# Patient Record
Sex: Female | Born: 1965 | Race: White | Hispanic: No | Marital: Married | Smoking: Never smoker
Health system: Southern US, Community
[De-identification: ages and names within clinical notes are randomized; demographics above are authoritative.]

## PROBLEM LIST (undated history)

## (undated) DIAGNOSIS — I10 Essential (primary) hypertension: Secondary | ICD-10-CM

## (undated) DIAGNOSIS — F419 Anxiety disorder, unspecified: Secondary | ICD-10-CM

## (undated) DIAGNOSIS — R51 Headache: Secondary | ICD-10-CM

## (undated) DIAGNOSIS — E039 Hypothyroidism, unspecified: Secondary | ICD-10-CM

## (undated) DIAGNOSIS — K859 Acute pancreatitis without necrosis or infection, unspecified: Secondary | ICD-10-CM

## (undated) DIAGNOSIS — E785 Hyperlipidemia, unspecified: Secondary | ICD-10-CM

## (undated) DIAGNOSIS — I6330 Cerebral infarction due to thrombosis of unspecified cerebral artery: Secondary | ICD-10-CM

## (undated) DIAGNOSIS — G4700 Insomnia, unspecified: Secondary | ICD-10-CM

## (undated) DIAGNOSIS — N189 Chronic kidney disease, unspecified: Secondary | ICD-10-CM

## (undated) DIAGNOSIS — R519 Headache, unspecified: Secondary | ICD-10-CM

## (undated) DIAGNOSIS — E119 Type 2 diabetes mellitus without complications: Secondary | ICD-10-CM

## (undated) DIAGNOSIS — I639 Cerebral infarction, unspecified: Secondary | ICD-10-CM

## (undated) DIAGNOSIS — Z87442 Personal history of urinary calculi: Secondary | ICD-10-CM

## (undated) DIAGNOSIS — G4730 Sleep apnea, unspecified: Secondary | ICD-10-CM

## (undated) DIAGNOSIS — M797 Fibromyalgia: Secondary | ICD-10-CM

## (undated) HISTORY — PX: SHOULDER SURGERY: SHX246

## (undated) HISTORY — PX: ABDOMINAL HYSTERECTOMY: SHX81

## (undated) HISTORY — PX: ROTATOR CUFF REPAIR: SHX139

## (undated) HISTORY — PX: CHOLECYSTECTOMY: SHX55

---

## 2014-11-30 DIAGNOSIS — Z8673 Personal history of transient ischemic attack (TIA), and cerebral infarction without residual deficits: Secondary | ICD-10-CM | POA: Insufficient documentation

## 2014-11-30 DIAGNOSIS — E781 Pure hyperglyceridemia: Secondary | ICD-10-CM | POA: Insufficient documentation

## 2014-12-05 ENCOUNTER — Ambulatory Visit (INDEPENDENT_AMBULATORY_CARE_PROVIDER_SITE_OTHER): Payer: Self-pay | Admitting: Endocrinology

## 2014-12-05 ENCOUNTER — Encounter: Payer: Self-pay | Admitting: Endocrinology

## 2014-12-05 VITALS — BP 132/78 | HR 93 | Temp 98.1°F | Wt 238.2 lb

## 2014-12-05 DIAGNOSIS — E118 Type 2 diabetes mellitus with unspecified complications: Secondary | ICD-10-CM

## 2014-12-05 LAB — POCT GLYCOSYLATED HEMOGLOBIN (HGB A1C): Hemoglobin A1C: 11.3

## 2014-12-05 MED ORDER — INSULIN ASPART PROT & ASPART (70-30 MIX) 100 UNIT/ML PEN: PEN_INJECTOR | SUBCUTANEOUS | Status: DC

## 2014-12-05 NOTE — Patient Instructions (Addendum)
good diet and exercise significantly improve the control of your diabetes.  please let me know if you wish to be referred to a dietician.  high blood sugar is very risky to your health.  you should see an eye doctor and dentist every year.  It is very important to get all recommended vaccinations.  controlling your blood pressure and cholesterol drastically reduces the damage diabetes does to your body.  Those who smoke should quit.  please discuss these with your doctor.  check your blood sugar twice a day.  vary the time of day when you check, between before the 3 meals, and at bedtime.  also check if you have symptoms of your blood sugar being too high or too low.  please keep a record of the readings and bring it to your next appointment here.  You can write it on any piece of paper.  please call us sooner if your blood sugar goes below 70, or if you have a lot of readings over 200. please try stopping the metformin, to see if this helps your nausea.  For now, please change both insulins to "novolog 70/30," 100 units with breakfast, and 50 units with the evening meal.   Please sigh a release of information for your past test results and hospitalization.   Please consider having weight loss surgery.  It is good for your health.  If you decide to consider further, please call [phone], and register for a free informational meeting.  Please call us next week, to tell us how the blood sugar is doing. Please come back for a follow-up appointment in 1 month.

## 2014-12-05 NOTE — Progress Notes (Signed)
Subjective:    Patient ID: [name], female    DOB: 1965/07/07, 49 y.o.   MRN: [id number]  HPI pt states DM was dx'ed in 2009; she has severe neuropathy of the lower extremities, but she is unaware of any associated chronic complications; he has been on insulin since dx; pt says her diet and exercise are good; she has never had GDM, pancreatitis, severe hypoglycemia or DKA.  She was dx'ed with chronic idiopathic pancreatitis in 2014.  She takes levemir 50 units bid, and novolog 14 units 3 times a day (just before each meal).  She says cbg's vary from 150-550.  There is no trend throughout the day.  She denies hypoglycemia.  Pt says she misses approx 1 insulin dose per day (usually at lunch). No past medical history on file.  No past surgical history on file.  Social History   Social History  . Marital Status: Married    Spouse Name: N/A  . Number of Children: N/A  . Years of Education: N/A   Occupational History  . Not on file.   Social History Main Topics  . Smoking status: Not on file  . Smokeless tobacco: Not on file  . Alcohol Use: Not on file  . Drug Use: Not on file  . Sexual Activity: Not on file   Other Topics Concern  . Not on file   Social History Narrative  . No narrative on file    No current outpatient prescriptions on file prior to visit.   No current facility-administered medications on file prior to visit.    Not on File  Family History  Problem Relation Age of Onset  . Diabetes Neg Hx     BP 132/78 mmHg  Pulse 93  Temp(Src) 98.1 F (36.7 C) (Oral)  Wt 238 lb 4 oz (108.069 kg)  SpO2 97%   Review of Systems denies weight loss, blurry vision, chest pain, sob, muscle cramps, excessive diaphoresis, cold intolerance, rhinorrhea, and easy bruising.  She has chronic nausea, fatigue, headache, urinary frequency, depression, and diffuse myalgias.      Objective:   Physical Exam VS: see vs page GEN: no distress.  Morbid obesity HEAD: head: no  deformity eyes: no periorbital swelling, no proptosis external nose and ears are normal mouth: no lesion seen NECK: supple, thyroid is not enlarged CHEST WALL: no deformity LUNGS:  Clear to auscultation CV: reg rate and rhythm, no murmur ABD: abdomen is soft, nontender.  no hepatosplenomegaly.  not distended.  Large self-reducing ventral hernia MUSCULOSKELETAL: muscle bulk and strength are grossly normal.  no obvious joint swelling.  gait is normal and steady EXTEMITIES: no deformity.  no ulcer on the feet.  feet are of normal color and temp.  1+ bilat leg edema PULSES: dorsalis pedis intact bilat.  no carotid bruit NEURO:  cn 2-12 grossly intact.   readily moves all 4's.  sensation is intact to touch on the feet, but severely decreased from normal. SKIN:  Normal texture and temperature.  No rash or suspicious lesion is visible.   NODES:  None palpable at the neck PSYCH: alert, well-oriented.  Does not appear anxious nor depressed.   A1c=11.4%    Assessment & Plan:  DM: severe exacerbation: we discussed insulin regimen options: as she sometimes misses lunch insulin, she chooses bid insulin Nausea: possibly due to metformin.   Patient is advised the following: Patient Instructions  good diet and exercise significantly improve the control of your diabetes.  please let  me know if you wish to be referred to a dietician.  high blood sugar is very risky to your health.  you should see an eye doctor and dentist every year.  It is very important to get all recommended vaccinations.  controlling your blood pressure and cholesterol drastically reduces the damage diabetes does to your body.  Those who smoke should quit.  please discuss these with your doctor.  check your blood sugar twice a day.  vary the time of day when you check, between before the 3 meals, and at bedtime.  also check if you have symptoms of your blood sugar being too high or too low.  please keep a record of the readings and  bring it to your next appointment here.  You can write it on any piece of paper.  please call us sooner if your blood sugar goes below 70, or if you have a lot of readings over 200. please try stopping the metformin, to see if this helps your nausea.  For now, please change both insulins to "novolog 70/30," 100 units with breakfast, and 50 units with the evening meal.   Please sigh a release of information for your past test results and hospitalization.   Please consider having weight loss surgery.  It is good for your health.  If you decide to consider further, please call [phone], and register for a free informational meeting.  Please call us next week, to tell us how the blood sugar is doing. Please come back for a follow-up appointment in 1 month.

## 2014-12-05 NOTE — Progress Notes (Signed)
Pre visit review using our clinic review tool, if applicable. No additional management support is needed unless otherwise documented below in the visit note. 

## 2014-12-17 ENCOUNTER — Telehealth: Payer: Self-pay | Admitting: Endocrinology

## 2014-12-17 MED ORDER — FLUCONAZOLE 150 MG PO TABS: 150.0000 mg | ORAL_TABLET | Freq: Once | ORAL | Status: DC

## 2014-12-17 NOTE — Telephone Encounter (Signed)
Please see below.

## 2014-12-17 NOTE — Telephone Encounter (Signed)
Since the increase in dosage of the insulin she started to develop a yeast infection can we call in something for her yeast infection

## 2014-12-17 NOTE — Telephone Encounter (Signed)
i have sent a prescription to your pharmacy. Skip simvastatin for 2 days after taking. Call PCP is sxs do not get better

## 2014-12-17 NOTE — Telephone Encounter (Signed)
Please call into [facility]

## 2014-12-17 NOTE — Telephone Encounter (Signed)
Forwarded message

## 2014-12-18 NOTE — Telephone Encounter (Signed)
Left voicemail advising of note below. Requested call back if the pt would like to discuss.  

## 2014-12-19 ENCOUNTER — Telehealth: Payer: Self-pay | Admitting: Endocrinology

## 2014-12-19 NOTE — Telephone Encounter (Signed)
See below

## 2014-12-19 NOTE — Telephone Encounter (Signed)
Pt advised of note below and voiced understanding.  

## 2014-12-19 NOTE — Telephone Encounter (Signed)
Pt has stated she has had blood sugars above 500 since she was last seen even with the insulin we put her on.

## 2014-12-19 NOTE — Telephone Encounter (Signed)
See note below and please advise, Thanks! 

## 2014-12-19 NOTE — Telephone Encounter (Signed)
Please verify no n/v Then increase insulin to 120 units with breakfast, and 70 units with the evening meal. Call 2 days to report progress

## 2014-12-21 ENCOUNTER — Telehealth: Payer: Self-pay

## 2014-12-21 NOTE — Telephone Encounter (Signed)
Left voicemail advising of note below requested call back if the pt would like to discuss.

## 2014-12-21 NOTE — Telephone Encounter (Signed)
Please increase the insulin to 140 units with breakfast, and 90 units with the evening meal. Please call in 4 days to report progress

## 2014-12-21 NOTE — Telephone Encounter (Signed)
Pt called to report blood sugar readings.   12/20/2014 Fasting: 459 Before Bed: 439  12/21/2014: Fasting: 399  Pt confirmed she is taking 120 units of novolog with breakfast and 70 units with her evening meal. Please advise, Thanks!

## 2014-12-25 ENCOUNTER — Telehealth: Payer: Self-pay | Admitting: Endocrinology

## 2014-12-25 MED ORDER — INSULIN ASPART PROT & ASPART (70-30 MIX) 100 UNIT/ML PEN: PEN_INJECTOR | SUBCUTANEOUS | Status: DC

## 2014-12-25 NOTE — Telephone Encounter (Signed)
Patient called to report blood sugar readings. 9/3: Fasting: 315 Before Bed: 389  9/4: Fasting: 259 Before Bed: 410  9/5: Pt stated she could not remember exactly these numbers but they were over 300  9/6: Fasting: 379  Patient is taking 140 units of novolog at breakfast and 90 units with her evening meal.   Please advise, Thanks!

## 2014-12-25 NOTE — Telephone Encounter (Signed)
Pt advised of note below and voiced understanding.  

## 2014-12-25 NOTE — Telephone Encounter (Signed)
Pt calling to speak with megan please call back regarding blood sugars

## 2014-12-25 NOTE — Addendum Note (Signed)
Addended by: Bethann Punches E on: 12/25/2014 04:26 PM   Modules accepted: Orders

## 2014-12-25 NOTE — Telephone Encounter (Signed)
Please increase the insulin to 160 units with breakfast, and 110 units with the evening meal. Please call in 3 days to report progress

## 2015-01-04 ENCOUNTER — Ambulatory Visit: Payer: Self-pay | Admitting: Endocrinology

## 2015-03-01 ENCOUNTER — Telehealth: Payer: Self-pay | Admitting: Endocrinology

## 2015-03-01 NOTE — Telephone Encounter (Signed)
I contacted the pt and left a voicemail advising her office visit is due. Requested call back for patient to schedule.

## 2015-03-01 NOTE — Telephone Encounter (Signed)
please call patient: Ov is due 

## 2015-03-25 ENCOUNTER — Encounter: Payer: Self-pay | Admitting: Endocrinology

## 2015-03-25 ENCOUNTER — Ambulatory Visit (INDEPENDENT_AMBULATORY_CARE_PROVIDER_SITE_OTHER): Payer: Managed Care, Other (non HMO) | Admitting: Endocrinology

## 2015-03-25 VITALS — BP 132/86 | HR 109 | Temp 97.9°F | Ht 65.0 in | Wt 243.0 lb

## 2015-03-25 DIAGNOSIS — E039 Hypothyroidism, unspecified: Secondary | ICD-10-CM | POA: Insufficient documentation

## 2015-03-25 DIAGNOSIS — I10 Essential (primary) hypertension: Secondary | ICD-10-CM | POA: Diagnosis not present

## 2015-03-25 DIAGNOSIS — E1142 Type 2 diabetes mellitus with diabetic polyneuropathy: Secondary | ICD-10-CM | POA: Diagnosis not present

## 2015-03-25 DIAGNOSIS — G2581 Restless legs syndrome: Secondary | ICD-10-CM

## 2015-03-25 DIAGNOSIS — Z794 Long term (current) use of insulin: Secondary | ICD-10-CM

## 2015-03-25 DIAGNOSIS — E119 Type 2 diabetes mellitus without complications: Secondary | ICD-10-CM | POA: Insufficient documentation

## 2015-03-25 LAB — POCT GLYCOSYLATED HEMOGLOBIN (HGB A1C): HEMOGLOBIN A1C: 11.2

## 2015-03-25 MED ORDER — INSULIN LISPRO PROT & LISPRO (75-25 MIX) 100 UNIT/ML KWIKPEN: PEN_INJECTOR | SUBCUTANEOUS | Status: DC

## 2015-03-25 NOTE — Addendum Note (Signed)
Addended by: Bethann PunchesUCK, MEGAN E on: 03/25/2015 09:30 AM   Modules accepted: Orders

## 2015-03-25 NOTE — Patient Instructions (Addendum)
Please increase the insulin to 180 units with breakfast, and 130 units with the evening meal.  i have sent a prescription to your pharmacy.   Please call us next week, to tell us how the blood sugar is doing.  Please come back for a follow-up appointment in 2 months.  check your blood sugar twice a day.  vary the time of day when you check, between before the 3 meals, and at bedtime.  also check if you have symptoms of your blood sugar being too high or too low.  please keep a record of the readings and bring it to your next appointment here.  You can write it on any piece of paper.  please call us sooner if your blood sugar goes below 70, or if you have a lot of readings over 200.

## 2015-03-25 NOTE — Progress Notes (Signed)
Subjective:    Patient ID: [name], female    DOB: 08-19-1965, 49 y.o.   MRN: [id number]  HPI Pt returns for f/u of diabetes mellitus: DM type: Insulin-requiring type 2 Dx'ed: 2009 Complications: polyneuropathy Therapy: insulin since dx GDM: never DKA: never Severe hypoglycemia: never Pancreatitis: never Other: she was dx'ed with chronic idiopathic pancreatitis in 2014; she changed to BID premixed insulin, after poor results with multiple daily injections Interval history: no cbg record, but states cbg's vary from 180-250.  It is higher in am.  She seldom misses the insulin.  Pt says ins now prefers humalog products.  She is considering the weight loss surgery. No past medical history on file.  No past surgical history on file.  Social History   Social History  . Marital Status: Married    Spouse Name: N/A  . Number of Children: N/A  . Years of Education: N/A   Occupational History  . Not on file.   Social History Main Topics  . Smoking status: Unknown If Ever Smoked  . Smokeless tobacco: Not on file  . Alcohol Use: No  . Drug Use: Not on file  . Sexual Activity: Not on file   Other Topics Concern  . Not on file   Social History Narrative    Current Outpatient Prescriptions on File Prior to Visit  Medication Sig Dispense Refill  . amLODipine (NORVASC) 10 MG tablet Take 10 mg by mouth.    . BD PEN NEEDLE NANO U/F 32G X 4 MM MISC     . carvedilol (COREG) 25 MG tablet Take 25 mg by mouth.    . fenofibrate micronized (LOFIBRA) 134 MG capsule Take 134 mg by mouth.    . fluconazole (DIFLUCAN) 150 MG tablet Take 1 tablet (150 mg total) by mouth once. 1 tablet 0  . hydrALAZINE (APRESOLINE) 25 MG tablet Take 25 mg by mouth.    . levothyroxine (SYNTHROID, LEVOTHROID) 25 MCG tablet Take by mouth.    Marland Kitchen. lisinopril-hydrochlorothiazide (PRINZIDE,ZESTORETIC) 20-12.5 MG per tablet Take by mouth.    Marland Kitchen. omeprazole (PRILOSEC) 20 MG capsule Take 20 mg by mouth.    .  ondansetron (ZOFRAN) 4 MG tablet     . ONE TOUCH ULTRA TEST test strip     . PARoxetine (PAXIL) 20 MG tablet     . ropinirole (REQUIP) 5 MG tablet     . simvastatin (ZOCOR) 40 MG tablet Take 40 mg by mouth.    Marland Kitchen. VASCEPA 1 G CAPS     . zolpidem (AMBIEN) 10 MG tablet Take 10 mg by mouth.     No current facility-administered medications on file prior to visit.    Not on File  Family History  Problem Relation Age of Onset  . Diabetes Neg Hx     BP 132/86 mmHg  Pulse 109  Temp(Src) 97.9 F (36.6 C) (Oral)  Ht 5\' 5"  (1.651 m)  Wt 243 lb (110.224 kg)  BMI 40.44 kg/m2  SpO2 96%   Review of Systems She denies hypoglycemia.    Objective:   Physical Exam VITAL SIGNS:  See vs page GENERAL: no distress SKIN:  Insulin injection sites at the anterior abdomen are normal.     A1c=11.2%    Assessment & Plan:  DM: ongoing poor control   Patient is advised the following: Patient Instructions  Please increase the insulin to 180 units with breakfast, and 130 units with the evening meal.  i have sent a prescription to your  pharmacy.   Please call us next week, to tell us how the blood sugar is doing.  Please come back for a follow-up appointment in 2 months.  check your blood sugar twice a day.  vary the time of day when you check, between before the 3 meals, and at bedtime.  also check if you have symptoms of your blood sugar being too high or too low.  please keep a record of the readings and bring it to your next appointment here.  You can write it on any piece of paper.  please call us sooner if your blood sugar goes below 70, or if you have a lot of readings over 200.

## 2015-05-27 ENCOUNTER — Ambulatory Visit: Payer: Managed Care, Other (non HMO) | Admitting: Endocrinology

## 2015-11-01 DIAGNOSIS — J45909 Unspecified asthma, uncomplicated: Secondary | ICD-10-CM

## 2015-11-01 DIAGNOSIS — E785 Hyperlipidemia, unspecified: Secondary | ICD-10-CM

## 2015-11-01 DIAGNOSIS — F418 Other specified anxiety disorders: Secondary | ICD-10-CM | POA: Diagnosis not present

## 2015-11-01 DIAGNOSIS — N183 Chronic kidney disease, stage 3 (moderate): Secondary | ICD-10-CM | POA: Diagnosis not present

## 2015-11-01 DIAGNOSIS — I161 Hypertensive emergency: Secondary | ICD-10-CM | POA: Diagnosis not present

## 2015-11-01 DIAGNOSIS — E109 Type 1 diabetes mellitus without complications: Secondary | ICD-10-CM

## 2015-11-02 DIAGNOSIS — N183 Chronic kidney disease, stage 3 (moderate): Secondary | ICD-10-CM | POA: Diagnosis not present

## 2015-11-02 DIAGNOSIS — I161 Hypertensive emergency: Secondary | ICD-10-CM | POA: Diagnosis not present

## 2015-11-02 DIAGNOSIS — F418 Other specified anxiety disorders: Secondary | ICD-10-CM | POA: Diagnosis not present

## 2015-11-02 DIAGNOSIS — E109 Type 1 diabetes mellitus without complications: Secondary | ICD-10-CM | POA: Diagnosis not present

## 2015-11-02 DIAGNOSIS — N390 Urinary tract infection, site not specified: Secondary | ICD-10-CM

## 2016-05-13 ENCOUNTER — Other Ambulatory Visit: Payer: Self-pay | Admitting: Endocrinology

## 2016-12-30 ENCOUNTER — Other Ambulatory Visit (HOSPITAL_COMMUNITY): Payer: Self-pay | Admitting: Nephrology

## 2016-12-30 DIAGNOSIS — N183 Chronic kidney disease, stage 3 (moderate): Principal | ICD-10-CM

## 2016-12-30 DIAGNOSIS — N1831 Chronic kidney disease, stage 3a: Secondary | ICD-10-CM

## 2017-01-15 ENCOUNTER — Ambulatory Visit (HOSPITAL_COMMUNITY): Payer: Managed Care, Other (non HMO)

## 2017-01-20 ENCOUNTER — Other Ambulatory Visit: Payer: Self-pay | Admitting: Radiology

## 2017-01-21 ENCOUNTER — Other Ambulatory Visit: Payer: Self-pay | Admitting: Physician Assistant

## 2017-01-21 ENCOUNTER — Other Ambulatory Visit: Payer: Self-pay | Admitting: General Surgery

## 2017-01-22 ENCOUNTER — Ambulatory Visit (HOSPITAL_COMMUNITY)
Admission: RE | Admit: 2017-01-22 | Discharge: 2017-01-22 | Disposition: A | Discharge status: Home / Self Care | Payer: 59 | Source: Ambulatory Visit | Attending: Nephrology | Admitting: Nephrology

## 2017-01-22 DIAGNOSIS — G4730 Sleep apnea, unspecified: Secondary | ICD-10-CM | POA: Diagnosis not present

## 2017-01-22 DIAGNOSIS — Z87442 Personal history of urinary calculi: Secondary | ICD-10-CM | POA: Diagnosis not present

## 2017-01-22 DIAGNOSIS — Z90710 Acquired absence of both cervix and uterus: Secondary | ICD-10-CM | POA: Diagnosis not present

## 2017-01-22 DIAGNOSIS — Z8673 Personal history of transient ischemic attack (TIA), and cerebral infarction without residual deficits: Secondary | ICD-10-CM | POA: Diagnosis not present

## 2017-01-22 DIAGNOSIS — I129 Hypertensive chronic kidney disease with stage 1 through stage 4 chronic kidney disease, or unspecified chronic kidney disease: Secondary | ICD-10-CM | POA: Diagnosis not present

## 2017-01-22 DIAGNOSIS — E785 Hyperlipidemia, unspecified: Secondary | ICD-10-CM | POA: Insufficient documentation

## 2017-01-22 DIAGNOSIS — N183 Chronic kidney disease, stage 3 (moderate): Secondary | ICD-10-CM | POA: Diagnosis not present

## 2017-01-22 DIAGNOSIS — Z9049 Acquired absence of other specified parts of digestive tract: Secondary | ICD-10-CM | POA: Insufficient documentation

## 2017-01-22 DIAGNOSIS — Z79899 Other long term (current) drug therapy: Secondary | ICD-10-CM | POA: Insufficient documentation

## 2017-01-22 DIAGNOSIS — G4700 Insomnia, unspecified: Secondary | ICD-10-CM | POA: Diagnosis not present

## 2017-01-22 DIAGNOSIS — M797 Fibromyalgia: Secondary | ICD-10-CM | POA: Diagnosis not present

## 2017-01-22 DIAGNOSIS — Z794 Long term (current) use of insulin: Secondary | ICD-10-CM | POA: Insufficient documentation

## 2017-01-22 DIAGNOSIS — K859 Acute pancreatitis without necrosis or infection, unspecified: Secondary | ICD-10-CM | POA: Diagnosis not present

## 2017-01-22 DIAGNOSIS — N1831 Chronic kidney disease, stage 3a: Secondary | ICD-10-CM

## 2017-01-22 DIAGNOSIS — E1122 Type 2 diabetes mellitus with diabetic chronic kidney disease: Secondary | ICD-10-CM | POA: Insufficient documentation

## 2017-01-22 HISTORY — DX: Type 2 diabetes mellitus without complications: E11.9

## 2017-01-22 HISTORY — DX: Insomnia, unspecified: G47.00

## 2017-01-22 HISTORY — DX: Acute pancreatitis without necrosis or infection, unspecified: K85.90

## 2017-01-22 HISTORY — DX: Sleep apnea, unspecified: G47.30

## 2017-01-22 HISTORY — DX: Essential (primary) hypertension: I10

## 2017-01-22 HISTORY — DX: Personal history of urinary calculi: Z87.442

## 2017-01-22 HISTORY — DX: Chronic kidney disease, unspecified: N18.9

## 2017-01-22 HISTORY — DX: Headache: R51

## 2017-01-22 HISTORY — DX: Hyperlipidemia, unspecified: E78.5

## 2017-01-22 HISTORY — DX: Headache, unspecified: R51.9

## 2017-01-22 HISTORY — DX: Cerebral infarction, unspecified: I63.9

## 2017-01-22 HISTORY — DX: Fibromyalgia: M79.7

## 2017-01-22 LAB — PROTIME-INR
INR: 0.87
Prothrombin Time: 11.8 seconds (ref 11.4–15.2)

## 2017-01-22 LAB — CBC
HEMATOCRIT: 35.2 % (ref 35.0–47.0)
Hemoglobin: 12.5 g/dL (ref 12.0–16.0)
MCH: 30.3 pg (ref 26.0–34.0)
MCHC: 35.5 g/dL (ref 32.0–36.0)
MCV: 85.3 fL (ref 80.0–100.0)
Platelets: 316 10*3/uL (ref 150–440)
RBC: 4.12 MIL/uL (ref 3.80–5.20)
RDW: 13.2 % (ref 11.5–14.5)
WBC: 9.2 10*3/uL (ref 3.6–11.0)

## 2017-01-22 LAB — APTT: aPTT: 28 seconds (ref 24–36)

## 2017-01-22 LAB — GLUCOSE, CAPILLARY: Glucose-Capillary: 321 mg/dL — ABNORMAL HIGH (ref 65–99)

## 2017-01-22 MED ORDER — SODIUM CHLORIDE 0.9 % IV SOLN
INTRAVENOUS | Status: DC
  Administered 2017-01-22: 1000 mL via INTRAVENOUS

## 2017-01-22 MED ORDER — MIDAZOLAM HCL 2 MG/2ML IJ SOLN
INTRAMUSCULAR | Status: AC
  Filled 2017-01-22: qty 4

## 2017-01-22 MED ORDER — HYDRALAZINE HCL 20 MG/ML IJ SOLN
INTRAMUSCULAR | Status: AC
  Filled 2017-01-22: qty 1

## 2017-01-22 MED ORDER — FENTANYL CITRATE (PF) 100 MCG/2ML IJ SOLN
INTRAMUSCULAR | Status: AC
  Filled 2017-01-22: qty 4

## 2017-01-22 MED ORDER — HYDROCODONE-ACETAMINOPHEN 5-325 MG PO TABS: 1.0000 | ORAL_TABLET | ORAL | Status: DC | PRN

## 2017-01-22 MED ORDER — FENTANYL CITRATE (PF) 100 MCG/2ML IJ SOLN
INTRAMUSCULAR | Status: AC | PRN
  Administered 2017-01-22 (×2): 50 ug via INTRAVENOUS

## 2017-01-22 MED ORDER — MIDAZOLAM HCL 2 MG/2ML IJ SOLN
INTRAMUSCULAR | Status: AC | PRN
  Administered 2017-01-22 (×3): 1 mg via INTRAVENOUS

## 2017-01-22 MED ORDER — HYDRALAZINE HCL 20 MG/ML IJ SOLN
INTRAMUSCULAR | Status: AC | PRN
  Administered 2017-01-22 (×2): 10 mg via INTRAVENOUS

## 2017-01-22 NOTE — H&P (Signed)
Chief Complaint: Patient was seen in consultation today for renal biopsy at the request of [name],Jessica  Referring Physician(s): [name],Jessica  Patient Status: [facility] - Out-pt  History of Present Illness: [name] is a 51 y.o. female with a history of CKD, stage III, DM and HTN here for US guided renal biopsy.  Originally scheduled at [facility] but rescheduled at [facility] due to inability to perform procedure at [facility] today and need for patient to have procedure today due to work issues.  Currently asymptomatic.  Past Medical History:  Diagnosis Date  . Chronic kidney disease   . Diabetes mellitus without complication (HCC)   . Fibromyalgia   . Headache   . History of kidney stones   . Hyperlipemia   . Hypertension   . Insomnia   . Pancreatitis    chronic  . Sleep apnea   . Stroke (cerebrum) [facility]     Past Surgical History:  Procedure Laterality Date  . ABDOMINAL HYSTERECTOMY    . CHOLECYSTECTOMY      Allergies: Patient has no known allergies.  Medications: Prior to Admission medications   Medication Sig Start Date End Date Taking? Authorizing Provider  amLODipine (NORVASC) 10 MG tablet Take 10 mg by mouth daily.    Yes [provider]  atorvastatin (LIPITOR) 20 MG tablet Take 20 mg by mouth daily.   Yes [provider]  carvedilol (COREG) 25 MG tablet Take 25 mg by mouth 2 (two) times daily with a meal.    Yes [provider]  chlorthalidone (HYGROTON) 25 MG tablet Take 25 mg by mouth daily.   Yes [provider]  DULoxetine (CYMBALTA) 60 MG capsule Take 60 mg by mouth daily.   Yes [provider]  hydrALAZINE (APRESOLINE) 50 MG tablet Take 50 mg by mouth 3 (three) times daily.   Yes [provider]  insulin regular human CONCENTRATED (HUMULIN R U-500 KWIKPEN) 500 UNIT/ML kwikpen Inject 80 Units into the skin 3 (three) times daily with meals.  12/02/16  Yes [provider]  lisinopril  (PRINIVIL,ZESTRIL) 40 MG tablet Take 40 mg by mouth daily.   Yes [provider]     Family History  Problem Relation Age of Onset  . Diabetes Neg Hx     Social History   Social History  . Marital status: Married    Spouse name: N/A  . Number of children: N/A  . Years of education: N/A   Social History Main Topics  . Smoking status: Never Smoker  . Smokeless tobacco: Never Used  . Alcohol use No  . Drug use: No  . Sexual activity: Not Asked   Other Topics Concern  . None   Social History Narrative  . None     Review of Systems: A 12 point ROS discussed and pertinent positives are indicated in the HPI above.  All other systems are negative.  Review of Systems  Constitutional: Negative.   HENT: Negative.   Respiratory: Negative.   Cardiovascular: Negative.   Gastrointestinal: Negative.   Genitourinary: Negative.   Musculoskeletal: Negative.   Neurological: Negative.     Vital Signs: BP (!) 165/98   Pulse 98   Temp 98.6 F (37 C) (Oral)   Resp 17   Ht  (1.651 m)   Wt 243 lb (110.2 kg)   SpO2 98%   BMI 40.44 kg/m   Physical Exam  Constitutional: She is oriented to person, place, and time. She appears well-developed and well-nourished.  No distress.  HENT:  Head: Normocephalic and atraumatic.  Neck: Neck supple. No JVD present.  Cardiovascular: Normal rate, regular rhythm and normal heart sounds.  Exam reveals no gallop and no friction rub.   No murmur heard. Pulmonary/Chest: Effort normal and breath sounds normal. No stridor. No respiratory distress. She has no wheezes. She has no rales.  Abdominal: Soft. Bowel sounds are normal. She exhibits no distension and no mass. There is no tenderness. There is no rebound and no guarding.  Musculoskeletal: She exhibits no edema.  Neurological: She is alert and oriented to person, place, and time.  Skin: Skin is warm and dry. She is not diaphoretic.  Vitals reviewed.   Imaging: Prior renal US at  [facility] on 11/12/2015 reviewed.  Labs: Pre-procedural labs pending.   Assessment and Plan:  For US guided random core biopsy of kidney today.  Risks and benefits discussed with the patient including, but not limited to bleeding, infection, damage to adjacent structures or low yield requiring additional tests. All of the patient's questions were answered, patient is agreeable to proceed. Consent signed and in chart.  Thank you for this interesting consult.  I greatly enjoyed meeting [name] and look forward to participating in their care.  A copy of this report was sent to the requesting provider on this date.  Electronically Signed: Reola Calkins, MD 01/22/2017, 10:21 AM   I spent a total of 30 Minutes in face to face in clinical consultation, greater than 50% of which was counseling/coordinating care for renal biopsy.

## 2017-01-22 NOTE — Progress Notes (Signed)
Patient up to bathroom. Patient's urine was rootbeer colored. Notified Dr. Fredia Sorrow. Patient denies any pain. BP is WNL. MD states patient may be D/C'd to home. Monitor at home and return for any worsening symptoms.

## 2017-01-22 NOTE — Procedures (Signed)
Interventional Radiology Procedure Note  Procedure: US guided core biopsy of left kidney  Complications: None  Estimated Blood Loss: < 10 mL  Recommendations: 16 G core biopsy of left lower kidney x 2.  Jodi Marble. Fredia Sorrow, M.D Pager:  [phone]

## 2017-01-30 LAB — SURGICAL PATHOLOGY

## 2017-02-01 ENCOUNTER — Encounter: Payer: Self-pay | Admitting: Nephrology

## 2017-09-28 DIAGNOSIS — R079 Chest pain, unspecified: Secondary | ICD-10-CM | POA: Diagnosis not present

## 2017-09-28 DIAGNOSIS — E119 Type 2 diabetes mellitus without complications: Secondary | ICD-10-CM | POA: Diagnosis not present

## 2017-09-28 DIAGNOSIS — I10 Essential (primary) hypertension: Secondary | ICD-10-CM | POA: Diagnosis not present

## 2017-09-28 DIAGNOSIS — I160 Hypertensive urgency: Secondary | ICD-10-CM

## 2017-09-29 DIAGNOSIS — I517 Cardiomegaly: Secondary | ICD-10-CM | POA: Diagnosis not present

## 2017-09-29 DIAGNOSIS — R079 Chest pain, unspecified: Secondary | ICD-10-CM | POA: Diagnosis not present

## 2017-09-29 DIAGNOSIS — I160 Hypertensive urgency: Secondary | ICD-10-CM | POA: Diagnosis not present

## 2017-09-29 DIAGNOSIS — E119 Type 2 diabetes mellitus without complications: Secondary | ICD-10-CM | POA: Diagnosis not present

## 2017-09-29 DIAGNOSIS — I10 Essential (primary) hypertension: Secondary | ICD-10-CM | POA: Diagnosis not present

## 2017-10-13 ENCOUNTER — Ambulatory Visit: Payer: 59 | Admitting: Cardiology

## 2018-03-01 DIAGNOSIS — I639 Cerebral infarction, unspecified: Secondary | ICD-10-CM

## 2018-03-01 DIAGNOSIS — I161 Hypertensive emergency: Secondary | ICD-10-CM

## 2018-03-01 DIAGNOSIS — N189 Chronic kidney disease, unspecified: Secondary | ICD-10-CM

## 2018-03-01 DIAGNOSIS — N289 Disorder of kidney and ureter, unspecified: Secondary | ICD-10-CM

## 2018-03-01 DIAGNOSIS — I517 Cardiomegaly: Secondary | ICD-10-CM

## 2018-03-01 DIAGNOSIS — E119 Type 2 diabetes mellitus without complications: Secondary | ICD-10-CM

## 2018-03-01 DIAGNOSIS — E785 Hyperlipidemia, unspecified: Secondary | ICD-10-CM

## 2018-03-01 DIAGNOSIS — I10 Essential (primary) hypertension: Secondary | ICD-10-CM

## 2018-03-01 DIAGNOSIS — Z9114 Patient's other noncompliance with medication regimen: Secondary | ICD-10-CM

## 2018-03-01 DIAGNOSIS — E039 Hypothyroidism, unspecified: Secondary | ICD-10-CM

## 2018-03-02 ENCOUNTER — Other Ambulatory Visit: Payer: Self-pay

## 2018-03-02 ENCOUNTER — Inpatient Hospital Stay (HOSPITAL_COMMUNITY)
Admission: AD | Admit: 2018-03-02 | Discharge: 2018-03-04 | DRG: 065 | Disposition: A | Discharge status: Home Health | Payer: 59 | Source: Other Acute Inpatient Hospital | Attending: Internal Medicine | Admitting: Internal Medicine

## 2018-03-02 ENCOUNTER — Encounter (HOSPITAL_COMMUNITY): Payer: Self-pay

## 2018-03-02 DIAGNOSIS — Z6839 Body mass index (BMI) 39.0-39.9, adult: Secondary | ICD-10-CM | POA: Diagnosis not present

## 2018-03-02 DIAGNOSIS — N183 Chronic kidney disease, stage 3 (moderate): Secondary | ICD-10-CM | POA: Diagnosis not present

## 2018-03-02 DIAGNOSIS — G2581 Restless legs syndrome: Secondary | ICD-10-CM | POA: Diagnosis present

## 2018-03-02 DIAGNOSIS — Z538 Procedure and treatment not carried out for other reasons: Secondary | ICD-10-CM | POA: Diagnosis not present

## 2018-03-02 DIAGNOSIS — Z9114 Patient's other noncompliance with medication regimen: Secondary | ICD-10-CM | POA: Diagnosis not present

## 2018-03-02 DIAGNOSIS — G8194 Hemiplegia, unspecified affecting left nondominant side: Secondary | ICD-10-CM | POA: Diagnosis present

## 2018-03-02 DIAGNOSIS — Z87442 Personal history of urinary calculi: Secondary | ICD-10-CM

## 2018-03-02 DIAGNOSIS — R29705 NIHSS score 5: Secondary | ICD-10-CM | POA: Diagnosis not present

## 2018-03-02 DIAGNOSIS — I129 Hypertensive chronic kidney disease with stage 1 through stage 4 chronic kidney disease, or unspecified chronic kidney disease: Secondary | ICD-10-CM | POA: Diagnosis not present

## 2018-03-02 DIAGNOSIS — I10 Essential (primary) hypertension: Secondary | ICD-10-CM | POA: Diagnosis present

## 2018-03-02 DIAGNOSIS — R29703 NIHSS score 3: Secondary | ICD-10-CM | POA: Diagnosis present

## 2018-03-02 DIAGNOSIS — I672 Cerebral atherosclerosis: Secondary | ICD-10-CM | POA: Diagnosis present

## 2018-03-02 DIAGNOSIS — F329 Major depressive disorder, single episode, unspecified: Secondary | ICD-10-CM | POA: Diagnosis present

## 2018-03-02 DIAGNOSIS — E1142 Type 2 diabetes mellitus with diabetic polyneuropathy: Secondary | ICD-10-CM

## 2018-03-02 DIAGNOSIS — I639 Cerebral infarction, unspecified: Secondary | ICD-10-CM

## 2018-03-02 DIAGNOSIS — I63521 Cerebral infarction due to unspecified occlusion or stenosis of right anterior cerebral artery: Secondary | ICD-10-CM | POA: Diagnosis present

## 2018-03-02 DIAGNOSIS — Z794 Long term (current) use of insulin: Secondary | ICD-10-CM | POA: Diagnosis not present

## 2018-03-02 DIAGNOSIS — I6389 Other cerebral infarction: Secondary | ICD-10-CM | POA: Diagnosis not present

## 2018-03-02 DIAGNOSIS — Z90710 Acquired absence of both cervix and uterus: Secondary | ICD-10-CM

## 2018-03-02 DIAGNOSIS — E785 Hyperlipidemia, unspecified: Secondary | ICD-10-CM | POA: Diagnosis present

## 2018-03-02 DIAGNOSIS — E1122 Type 2 diabetes mellitus with diabetic chronic kidney disease: Secondary | ICD-10-CM | POA: Diagnosis not present

## 2018-03-02 DIAGNOSIS — E1165 Type 2 diabetes mellitus with hyperglycemia: Secondary | ICD-10-CM | POA: Diagnosis not present

## 2018-03-02 DIAGNOSIS — R296 Repeated falls: Secondary | ICD-10-CM | POA: Diagnosis not present

## 2018-03-02 DIAGNOSIS — M797 Fibromyalgia: Secondary | ICD-10-CM | POA: Diagnosis not present

## 2018-03-02 DIAGNOSIS — E039 Hypothyroidism, unspecified: Secondary | ICD-10-CM | POA: Diagnosis present

## 2018-03-02 DIAGNOSIS — Z79899 Other long term (current) drug therapy: Secondary | ICD-10-CM

## 2018-03-02 DIAGNOSIS — F419 Anxiety disorder, unspecified: Secondary | ICD-10-CM | POA: Diagnosis not present

## 2018-03-02 DIAGNOSIS — N189 Chronic kidney disease, unspecified: Secondary | ICD-10-CM | POA: Diagnosis not present

## 2018-03-02 DIAGNOSIS — Z91148 Patient's other noncompliance with medication regimen for other reason: Secondary | ICD-10-CM

## 2018-03-02 DIAGNOSIS — G40909 Epilepsy, unspecified, not intractable, without status epilepticus: Secondary | ICD-10-CM | POA: Diagnosis present

## 2018-03-02 DIAGNOSIS — R29702 NIHSS score 2: Secondary | ICD-10-CM | POA: Diagnosis not present

## 2018-03-02 DIAGNOSIS — E119 Type 2 diabetes mellitus without complications: Secondary | ICD-10-CM

## 2018-03-02 LAB — CBC
HEMATOCRIT: 41 % (ref 36.0–46.0)
Hemoglobin: 13.4 g/dL (ref 12.0–15.0)
MCH: 28 pg (ref 26.0–34.0)
MCHC: 32.7 g/dL (ref 30.0–36.0)
MCV: 85.6 fL (ref 80.0–100.0)
PLATELETS: 329 10*3/uL (ref 150–400)
RBC: 4.79 MIL/uL (ref 3.87–5.11)
RDW: 12.8 % (ref 11.5–15.5)
WBC: 8.7 10*3/uL (ref 4.0–10.5)
nRBC: 0 % (ref 0.0–0.2)

## 2018-03-02 LAB — GLUCOSE, CAPILLARY: Glucose-Capillary: 204 mg/dL — ABNORMAL HIGH (ref 70–99)

## 2018-03-02 LAB — CREATININE, SERUM
Creatinine, Ser: 1.57 mg/dL — ABNORMAL HIGH (ref 0.44–1.00)
GFR, EST AFRICAN AMERICAN: 43 mL/min — AB (ref >60)
GFR, EST NON AFRICAN AMERICAN: 37 mL/min — AB (ref >60)

## 2018-03-02 MED ORDER — ZOLPIDEM TARTRATE 5 MG PO TABS
5.0000 mg | ORAL_TABLET | Freq: Once | ORAL | Status: AC
  Administered 2018-03-02: 5 mg via ORAL
  Filled 2018-03-02: qty 1

## 2018-03-02 MED ORDER — DULOXETINE HCL 60 MG PO CPEP
60.0000 mg | ORAL_CAPSULE | Freq: Every day | ORAL | Status: DC
  Administered 2018-03-02 – 2018-03-04 (×3): 60 mg via ORAL
  Filled 2018-03-02 (×3): qty 1

## 2018-03-02 MED ORDER — ATORVASTATIN CALCIUM 10 MG PO TABS: 20.0000 mg | ORAL_TABLET | Freq: Every day | ORAL | Status: DC

## 2018-03-02 MED ORDER — LEVETIRACETAM 500 MG PO TABS
500.0000 mg | ORAL_TABLET | Freq: Two times a day (BID) | ORAL | Status: DC
  Administered 2018-03-02 – 2018-03-04 (×4): 500 mg via ORAL
  Filled 2018-03-02 (×4): qty 1

## 2018-03-02 MED ORDER — ACETAMINOPHEN 650 MG RE SUPP: 650.0000 mg | RECTAL | Status: DC | PRN

## 2018-03-02 MED ORDER — SENNOSIDES-DOCUSATE SODIUM 8.6-50 MG PO TABS: 1.0000 | ORAL_TABLET | Freq: Every evening | ORAL | Status: DC | PRN

## 2018-03-02 MED ORDER — CARVEDILOL 12.5 MG PO TABS
25.0000 mg | ORAL_TABLET | Freq: Two times a day (BID) | ORAL | Status: DC
  Administered 2018-03-03: 25 mg via ORAL
  Filled 2018-03-02 (×2): qty 2

## 2018-03-02 MED ORDER — ACETAMINOPHEN 160 MG/5ML PO SOLN: 650.0000 mg | ORAL | Status: DC | PRN

## 2018-03-02 MED ORDER — LISINOPRIL 20 MG PO TABS
40.0000 mg | ORAL_TABLET | Freq: Every day | ORAL | Status: DC
  Administered 2018-03-02: 40 mg via ORAL
  Filled 2018-03-02: qty 2

## 2018-03-02 MED ORDER — SODIUM CHLORIDE 0.9 % IV SOLN
INTRAVENOUS | Status: DC
  Administered 2018-03-02: 23:00:00 via INTRAVENOUS

## 2018-03-02 MED ORDER — ENOXAPARIN SODIUM 40 MG/0.4ML ~~LOC~~ SOLN
40.0000 mg | SUBCUTANEOUS | Status: DC
  Administered 2018-03-02 – 2018-03-03 (×2): 40 mg via SUBCUTANEOUS
  Filled 2018-03-02 (×2): qty 0.4

## 2018-03-02 MED ORDER — STROKE: EARLY STAGES OF RECOVERY BOOK
Freq: Once | Status: AC
  Administered 2018-03-02: 23:00:00
  Filled 2018-03-02: qty 1

## 2018-03-02 MED ORDER — CHLORTHALIDONE 25 MG PO TABS: 25.0000 mg | ORAL_TABLET | Freq: Every day | ORAL | Status: DC

## 2018-03-02 MED ORDER — ATORVASTATIN CALCIUM 80 MG PO TABS
80.0000 mg | ORAL_TABLET | Freq: Every day | ORAL | Status: DC
  Administered 2018-03-02 – 2018-03-03 (×2): 80 mg via ORAL
  Filled 2018-03-02 (×2): qty 1

## 2018-03-02 MED ORDER — ACETAMINOPHEN 325 MG PO TABS
650.0000 mg | ORAL_TABLET | ORAL | Status: DC | PRN
  Administered 2018-03-03 (×2): 650 mg via ORAL
  Filled 2018-03-02 (×2): qty 2

## 2018-03-02 MED ORDER — AMLODIPINE BESYLATE 10 MG PO TABS
10.0000 mg | ORAL_TABLET | Freq: Every day | ORAL | Status: DC
  Administered 2018-03-02: 10 mg via ORAL
  Filled 2018-03-02: qty 1

## 2018-03-02 MED ORDER — HYDRALAZINE HCL 50 MG PO TABS
50.0000 mg | ORAL_TABLET | Freq: Three times a day (TID) | ORAL | Status: DC
  Administered 2018-03-02 – 2018-03-03 (×2): 50 mg via ORAL
  Filled 2018-03-02 (×2): qty 1

## 2018-03-02 MED ORDER — ASPIRIN 325 MG PO TABS
325.0000 mg | ORAL_TABLET | Freq: Every day | ORAL | Status: DC
  Administered 2018-03-02 – 2018-03-03 (×2): 325 mg via ORAL
  Filled 2018-03-02 (×2): qty 1

## 2018-03-02 NOTE — H&P (Signed)
History and Physical   Jessica Breedingancy Wisnieski VWU:[id number] DOB: 30-Jan-1966 DOA: 03/02/2018  Referring [facility]NP[facility]  PCP: Abigail MiyamotoPerry, Lawrence Edward, MD   Outpatient Specialists: None  Patient [facility] from: [facility]  Chief Complaint: Acute stroke  HPI: [name] is a 52 y.o. female with medical history significant of previous CVA who has been noncompliant with medications, diabetes, hypertension, hyperlipidemia and hypothyroidism who presented with left-sided weakness that happened within the last few days.  Patient was also having numbness and blurred vision in the left eye.  There was also possible witnessed focal seizure activity by family.  [facility] with head CT CT angiogram as well as MRI of the brain.  She was found to have a watershed CVA on the right posterior and ACA territory.  There was recommendations for TEE.  Hospitalist spoke with Dr. SwazilandJordan of cardiology who accepted to do TEE was patient arrives here.  Patient was therefore transferred here for completing stroke work-up.  She has been started on Keppra prior to arrival based on the recommendation.  She is also on Lipitor as well as Norvasc.  Patient has had history of noncompliance.  Apparently symptoms started on Saturday night when she woke up on Sunday morning she was feeling better so decided not to seek for help.  In between however she has had multiple falls.  Without any injuries today although she had falls 5 times.  She had previous CVA with left-sided weakness.  Now the weakness is worse than baseline.  She is also been noncompliant with medications.  Her echocardiograms mainly shows LVH.Marland Kitchen.    Review of Systems: As per HPI otherwise 10 point review of systems negative.    Past Medical History:  Diagnosis Date  . Chronic kidney disease   . Diabetes mellitus without complication (HCC)   . Fibromyalgia   . Headache   . History of kidney stones   . Hyperlipemia    . Hypertension   . Insomnia   . Pancreatitis    chronic  . Sleep apnea   . Stroke (cerebrum) [facility](HCC)     Past Surgical History:  Procedure Laterality Date  . ABDOMINAL HYSTERECTOMY    . CHOLECYSTECTOMY    . ROTATOR CUFF REPAIR Right   . SHOULDER SURGERY Left      reports that she has never smoked. She has never used smokeless tobacco. She reports that she does not drink alcohol or use drugs.  No Known Allergies  Family History  Problem Relation Age of Onset  . Diabetes Neg Hx      Prior to Admission medications   Medication Sig Start Date End Date Taking? Authorizing Provider  amLODipine (NORVASC) 10 MG tablet Take 10 mg by mouth daily.     [provider]  atorvastatin (LIPITOR) 20 MG tablet Take 20 mg by mouth daily.    [provider]  carvedilol (COREG) 25 MG tablet Take 25 mg by mouth 2 (two) times daily with a meal.     [provider]  chlorthalidone (HYGROTON) 25 MG tablet Take 25 mg by mouth daily.    [provider]  DULoxetine (CYMBALTA) 60 MG capsule Take 60 mg by mouth daily.    [provider]  hydrALAZINE (APRESOLINE) 50 MG tablet Take 50 mg by mouth 3 (three) times daily.    [provider]  insulin regular human CONCENTRATED (HUMULIN R U-500 KWIKPEN) 500 UNIT/ML kwikpen Inject 80 Units into the skin 3 (  three) times daily with meals.  12/02/16   [provider]  lisinopril (PRINIVIL,ZESTRIL) 40 MG tablet Take 40 mg by mouth daily.    [provider]    Physical Exam: Vitals:   03/02/18 1919  BP: (!) 182/109  Pulse: 98  Resp: 18  Temp: 98.1 F (36.7 C)  TempSrc: Oral  SpO2: 97%  Weight: 106.3 kg  Height: 5\' 5"  (1.651 m)      Constitutional: NAD, calm, comfortable Vitals:   03/02/18 1919  BP: (!) 182/109  Pulse: 98  Resp: 18  Temp: 98.1 F (36.7 C)  TempSrc: Oral  SpO2: 97%  Weight: 106.3 kg  Height: 5\' 5"  (1.651 m)   Eyes: PERRL, lids and conjunctivae normal ENMT:  Mucous membranes are moist. Posterior pharynx clear of any exudate or lesions.Normal dentition.  Neck: normal, supple, no masses, no thyromegaly Respiratory: clear to auscultation bilaterally, no wheezing, no crackles. Normal respiratory effort. No accessory muscle use.  Cardiovascular: Regular rate and rhythm, no murmurs / rubs / gallops. No extremity edema. 2+ pedal pulses. No carotid bruits.  Abdomen: no tenderness, no masses palpated. No hepatosplenomegaly. Bowel sounds positive.  Musculoskeletal: no clubbing / cyanosis. No joint deformity upper and lower extremities. Good ROM, no contractures. Normal muscle tone.  Skin: no rashes, lesions, ulcers. No induration Neurologic: CN 2-12 grossly intact. Sensation intact, DTR normal. Strength 5/5 in all 4.  Psychiatric: Normal judgment and insight. Alert and oriented x 3. Normal mood.     Labs on Admission: I have personally reviewed following labs and imaging studies  CBC: No results for input(s): WBC, NEUTROABS, HGB, HCT, MCV, PLT in the last 168 hours. Basic Metabolic Panel: No results for input(s): NA, K, CL, CO2, GLUCOSE, BUN, CREATININE, CALCIUM, MG, PHOS in the last 168 hours. GFR: CrCl cannot be calculated (No successful lab value found.). Liver Function Tests: No results for input(s): AST, ALT, ALKPHOS, BILITOT, PROT, ALBUMIN in the last 168 hours. No results for input(s): LIPASE, AMYLASE in the last 168 hours. No results for input(s): AMMONIA in the last 168 hours. Coagulation Profile: No results for input(s): INR, PROTIME in the last 168 hours. Cardiac Enzymes: No results for input(s): CKTOTAL, CKMB, CKMBINDEX, TROPONINI in the last 168 hours. BNP (last 3 results) No results for input(s): PROBNP in the last 8760 hours. HbA1C: No results for input(s): HGBA1C in the last 72 hours. CBG: No results for input(s): GLUCAP in the last 168 hours. Lipid Profile: No results for input(s): CHOL, HDL, LDLCALC, TRIG, CHOLHDL, LDLDIRECT in  the last 72 hours. Thyroid Function Tests: No results for input(s): TSH, T4TOTAL, FREET4, T3FREE, THYROIDAB in the last 72 hours. Anemia Panel: No results for input(s): VITAMINB12, FOLATE, FERRITIN, TIBC, IRON, RETICCTPCT in the last 72 hours. Urine analysis: No results found for: COLORURINE, APPEARANCEUR, LABSPEC, PHURINE, GLUCOSEU, HGBUR, BILIRUBINUR, KETONESUR, PROTEINUR, UROBILINOGEN, NITRITE, LEUKOCYTESUR Sepsis Labs: @LABRCNTIP (procalcitonin:4,lacticidven:4) )No results found for this or any previous visit (from the past 240 hour(s)).   Radiological Exams on Admission: No results found.  EKG: Independently reviewed.  EKG from outside hospital showed normal sinus rhythm.  No significant ST changes.  Assessment/Plan Principal Problem:   Acute CVA (cerebrovascular accident) (HCC) Active Problems:   Diabetes (HCC)   Hypothyroidism   HTN (hypertension)   RLS (restless legs syndrome)   Obesity, morbid, BMI 40.0-49.9 (HCC)     #1 acute right-sided ischemic CVA of the ACA /MCA territory: Initial work-up largely completed at [facility].  Patient will be admitted for TEE.  Already on aspirin statin and blood pressure control.  Patient also has significant counseling regarding medication compliance.  Neurology will be consulted and will continue home regimen.  #2 hypothyroidism: Patient will be continued on levothyroxine.  #3 hyperlipidemia.  In the face of acute CVA patient will be on 80 mg of Lipitor for now.  #4 diabetes: Sliding scale insulin will be ordered with patient's home regimen.  She has not been on insulin U5 100 in the past but certainly appears noncompliance at this point.  #5 hypertension: Patient is on hydralazine and Norvasc.  Also Coreg.  Diuretics also on board previously.  #6 restless leg syndrome: Continue home regimen.  #7 morbid obesity: Counseling will be provided.    DVT prophylaxis: Lovenox Code Status: Full code Family Communication: No  family available Disposition Plan: Home Consults called: Neurology Dr. Petra Kuba Admission status: Inpatient  Severity of Illness: The appropriate patient status for this patient is INPATIENT. Inpatient status is judged to be reasonable and necessary in order to provide the required intensity of service to ensure the patient's safety. The patient's presenting symptoms, physical exam findings, and initial radiographic and laboratory data in the context of their chronic comorbidities is felt to place them at high risk for further clinical deterioration. Furthermore, it is not anticipated that the patient will be medically stable for discharge from the hospital within 2 midnights of admission. The following factors support the patient status of inpatient.   " The patient's presenting symptoms include left-sided weakness. " The worrisome physical exam findings include left hemiparesis. " The initial radiographic and laboratory data are worrisome because of CT and MRI evidence of CVA. " The chronic co-morbidities include diabetes hypertension and prior stroke.   * I certify that at the point of admission it is my clinical judgment that the patient will require inpatient hospital care spanning beyond 2 midnights from the point of admission due to high intensity of service, high risk for further deterioration and high frequency of surveillance required.Lonia Blood MD [facility] Pager [phone]  If 7PM-7AM, please contact night-coverage [email or web address] Password [facility]  03/02/2018, 8:42 PM

## 2018-03-02 NOTE — H&P (View-Only) (Signed)
Neurology Consultation Reason for Consult: Stroke Referring Physician: Garba, M  CC: Left-sided weakness  History is obtained from: Patient  HPI: [name] is a 52 y.o. female with a history of left-sided weakness that started on Monday.  On Monday, she reports a significant headache, and she began having intermittent episodes of the left arm and leg abnormal movements.  She states that it would just be flopping about.  When I asked her to specify whether it was "jerking" or flopping she says that it was just flopping about.  These episodes would last for 10 to 15 minutes before improving on their own.  She then had more difficulty with weakness of her left side which is chronically affected from a previous stroke and therefore presented to [facility] where an MRI which reveals a left ACA territory versus ACA/MCA watershed territory infarct.  CTA revealed multifocal intracranial atherosclerotic disease.  A TEE was recommended and for that reason she was transferred to [facility].   LKW: Monday tpa given?: no, outside of window   ROS: A 14 point ROS was performed and is negative except as noted in the HPI.   Past Medical History:  Diagnosis Date  . Chronic kidney disease   . Diabetes mellitus without complication (HCC)   . Fibromyalgia   . Headache   . History of kidney stones   . Hyperlipemia   . Hypertension   . Insomnia   . Pancreatitis    chronic  . Sleep apnea   . Stroke (cerebrum) (HCC)      Family History  Problem Relation Age of Onset  . Diabetes Neg Hx      Social History:  reports that she has never smoked. She has never used smokeless tobacco. She reports that she does not drink alcohol or use drugs.   Exam: Current vital signs: BP (!) 197/104 (BP Location: Left Arm)   Pulse 88   Temp 98.6 F (37 C) (Oral)   Resp 16   Ht 5' 5" (1.651 m)   Wt 106.3 kg   SpO2 98%   BMI 39.00 kg/m  Vital signs in last 24 hours: Temp:  [98.1 F (36.7 C)-98.6  F (37 C)] 98.6 F (37 C) (11/13 2100) Pulse Rate:  [88-98] 88 (11/13 2100) Resp:  [16-18] 16 (11/13 2100) BP: (182-197)/(104-109) 197/104 (11/13 2100) SpO2:  [97 %-98 %] 98 % (11/13 2100) Weight:  [106.3 kg] 106.3 kg (11/13 1919)   Physical Exam  Constitutional: Appears well-developed and well-nourished.  Psych: Affect appropriate to situation Eyes: No scleral injection HENT: No OP obstrucion Head: Normocephalic.  Cardiovascular: Normal rate and regular rhythm.  Respiratory: Effort normal, non-labored breathing GI: Soft.  No distension. There is no tenderness.  Skin: WDI  Neuro: Mental Status: Patient is awake, alert, oriented to person, place, month, year, and situation. Patient is able to give a clear and coherent history. No signs of aphasia or neglect Cranial Nerves: II: Visual Fields are full. Pupils are equal, round, and reactive to light.   III,IV, VI: EOMI without ptosis or diploplia.  V: Facial sensation is symmetric to temperature VII: Facial movement is symmetric.  VIII: hearing is intact to voice X: Uvula elevates symmetrically XI: Shoulder shrug is symmetric. XII: tongue is midline without atrophy or fasciculations.  Motor: Tone is normal. Bulk is normal. She has 4+/5 strength in the L arm with drift, 4/5 in the left leg.  Sensory: Sensation is diminished on the left.  Cerebellar: FNF and HKS   are intact on the right, consistent with weakness on the lef.t   I have reviewed the images obtained: MRI brain- right-sided medial infarct  CTA head-multifocal severe intracranial atherosclerotic disease  Impression: 52-year-old female with long-standing diabetes who presents with what I suspect is severe intracranial atherosclerotic disease.  The headache and onset does raise a possibility of reversible cerebral vasoconstriction syndrome (RCVS) but I think that this is less likely as the CTA findings are not typical.    Recommendations: 1) continue antiplatelet  therapy with aspirin, high-dose statin therapy 2) may need to consider TEE 3) PT, OT, ST 4) stroke team to follow   Harjit Leider, MD [facility] [phone]  If 7pm- 7am, please page neurology on call as listed in AMION.  

## 2018-03-02 NOTE — Consult Note (Signed)
Neurology Consultation Reason for Consult: Stroke Referring Physician: Mikeal Hawthorne, M  CC: Left-sided weakness  History is obtained from: Patient  HPI: [name] is a 52 y.o. female with a history of left-sided weakness that started on Monday.  On Monday, she reports a significant headache, and she began having intermittent episodes of the left arm and leg abnormal movements.  She states that it would just be flopping about.  When I asked her to specify whether it was "jerking" or flopping she says that it was just flopping about.  These episodes would last for 10 to 15 minutes before improving on their own.  She then had more difficulty with weakness of her left side which is chronically affected from a previous stroke and therefore presented to [facility] where an MRI which reveals a left ACA territory versus ACA/MCA watershed territory infarct.  CTA revealed multifocal intracranial atherosclerotic disease.  A TEE was recommended and for that reason she was transferred to [facility].   LKW: Monday tpa given?: no, outside of window   ROS: A 14 point ROS was performed and is negative except as noted in the HPI.   Past Medical History:  Diagnosis Date  . Chronic kidney disease   . Diabetes mellitus without complication (HCC)   . Fibromyalgia   . Headache   . History of kidney stones   . Hyperlipemia   . Hypertension   . Insomnia   . Pancreatitis    chronic  . Sleep apnea   . Stroke (cerebrum) [facility])      Family History  Problem Relation Age of Onset  . Diabetes Neg Hx      Social History:  reports that she has never smoked. She has never used smokeless tobacco. She reports that she does not drink alcohol or use drugs.   Exam: Current vital signs: BP (!) 197/104 (BP Location: Left Arm)   Pulse 88   Temp 98.6 F (37 C) (Oral)   Resp 16   Ht 5\' 5"  (1.651 m)   Wt 106.3 kg   SpO2 98%   BMI 39.00 kg/m  Vital signs in last 24 hours: Temp:  [98.1 F (36.7 C)-98.6  F (37 C)] 98.6 F (37 C) (11/13 2100) Pulse Rate:  [88-98] 88 (11/13 2100) Resp:  [16-18] 16 (11/13 2100) BP: (182-197)/(104-109) 197/104 (11/13 2100) SpO2:  [97 %-98 %] 98 % (11/13 2100) Weight:  [106.3 kg] 106.3 kg (11/13 1919)   Physical Exam  Constitutional: Appears well-developed and well-nourished.  Psych: Affect appropriate to situation Eyes: No scleral injection HENT: No OP obstrucion Head: Normocephalic.  Cardiovascular: Normal rate and regular rhythm.  Respiratory: Effort normal, non-labored breathing GI: Soft.  No distension. There is no tenderness.  Skin: WDI  Neuro: Mental Status: Patient is awake, alert, oriented to person, place, month, year, and situation. Patient is able to give a clear and coherent history. No signs of aphasia or neglect Cranial Nerves: II: Visual Fields are full. Pupils are equal, round, and reactive to light.   III,IV, VI: EOMI without ptosis or diploplia.  V: Facial sensation is symmetric to temperature VII: Facial movement is symmetric.  VIII: hearing is intact to voice X: Uvula elevates symmetrically XI: Shoulder shrug is symmetric. XII: tongue is midline without atrophy or fasciculations.  Motor: Tone is normal. Bulk is normal. She has 4+/5 strength in the L arm with drift, 4/5 in the left leg.  Sensory: Sensation is diminished on the left.  Cerebellar: FNF and HKS  are intact on the right, consistent with weakness on the lef.t   I have reviewed the images obtained: MRI brain- right-sided medial infarct  CTA head-multifocal severe intracranial atherosclerotic disease  Impression: 52 year old female with long-standing diabetes who presents with what I suspect is severe intracranial atherosclerotic disease.  The headache and onset does raise a possibility of reversible cerebral vasoconstriction syndrome (RCVS) but I think that this is less likely as the CTA findings are not typical.    Recommendations: 1) continue antiplatelet  therapy with aspirin, high-dose statin therapy 2) may need to consider TEE 3) PT, OT, ST 4) stroke team to follow   Ritta SlotMcNeill Kathe Wirick, MD [facility] [phone]  If 7pm- 7am, please page neurology on call as listed in AMION.

## 2018-03-03 ENCOUNTER — Inpatient Hospital Stay (HOSPITAL_COMMUNITY): Payer: 59

## 2018-03-03 ENCOUNTER — Encounter (HOSPITAL_COMMUNITY): Payer: Self-pay

## 2018-03-03 ENCOUNTER — Encounter (HOSPITAL_COMMUNITY)
Admission: AD | Disposition: A | Discharge status: Home Health | Payer: Self-pay | Source: Other Acute Inpatient Hospital | Attending: Internal Medicine

## 2018-03-03 DIAGNOSIS — G2581 Restless legs syndrome: Secondary | ICD-10-CM

## 2018-03-03 DIAGNOSIS — E1165 Type 2 diabetes mellitus with hyperglycemia: Secondary | ICD-10-CM

## 2018-03-03 DIAGNOSIS — I10 Essential (primary) hypertension: Secondary | ICD-10-CM

## 2018-03-03 DIAGNOSIS — E785 Hyperlipidemia, unspecified: Secondary | ICD-10-CM

## 2018-03-03 LAB — GLUCOSE, CAPILLARY
GLUCOSE-CAPILLARY: 160 mg/dL — AB (ref 70–99)
GLUCOSE-CAPILLARY: 244 mg/dL — AB (ref 70–99)
GLUCOSE-CAPILLARY: 260 mg/dL — AB (ref 70–99)
GLUCOSE-CAPILLARY: 278 mg/dL — AB (ref 70–99)
Glucose-Capillary: 282 mg/dL — ABNORMAL HIGH (ref 70–99)
Glucose-Capillary: 181 mg/dL — ABNORMAL HIGH (ref 70–99)
Glucose-Capillary: 173 mg/dL — ABNORMAL HIGH (ref 70–99)

## 2018-03-03 LAB — COMPREHENSIVE METABOLIC PANEL
ALK PHOS: 184 U/L — AB (ref 38–126)
ALT: 35 U/L (ref 0–44)
AST: 36 U/L (ref 15–41)
Albumin: 2.4 g/dL — ABNORMAL LOW (ref 3.5–5.0)
Anion gap: 7 (ref 5–15)
BILIRUBIN TOTAL: 0.6 mg/dL (ref 0.3–1.2)
BUN: 19 mg/dL (ref 6–20)
CALCIUM: 8.5 mg/dL — AB (ref 8.9–10.3)
CHLORIDE: 104 mmol/L (ref 98–111)
CO2: 24 mmol/L (ref 22–32)
CREATININE: 1.48 mg/dL — AB (ref 0.44–1.00)
GFR, EST AFRICAN AMERICAN: 47 mL/min — AB (ref >60)
GFR, EST NON AFRICAN AMERICAN: 40 mL/min — AB (ref >60)
Glucose, Bld: 301 mg/dL — ABNORMAL HIGH (ref 70–99)
Potassium: 3.7 mmol/L (ref 3.5–5.1)
Sodium: 135 mmol/L (ref 135–145)
Total Protein: 5.8 g/dL — ABNORMAL LOW (ref 6.5–8.1)

## 2018-03-03 LAB — LIPID PANEL
CHOL/HDL RATIO: 11.5 ratio
Cholesterol: 460 mg/dL — ABNORMAL HIGH (ref 0–200)
HDL: 40 mg/dL — AB (ref >40)
LDL CALC: UNDETERMINED mg/dL (ref 0–99)
Triglycerides: 1031 mg/dL — ABNORMAL HIGH (ref <150)
VLDL: UNDETERMINED mg/dL (ref 0–40)

## 2018-03-03 LAB — HIV ANTIBODY (ROUTINE TESTING W REFLEX): HIV Screen 4th Generation wRfx: NONREACTIVE

## 2018-03-03 LAB — HEMOGLOBIN A1C
HEMOGLOBIN A1C: 13.7 % — AB (ref 4.8–5.6)
Mean Plasma Glucose: 346.49 mg/dL

## 2018-03-03 SURGERY — INVASIVE LAB ABORTED CASE

## 2018-03-03 MED ORDER — INSULIN GLARGINE 100 UNIT/ML ~~LOC~~ SOLN
30.0000 [IU] | Freq: Every day | SUBCUTANEOUS | Status: DC
  Administered 2018-03-04: 30 [IU] via SUBCUTANEOUS
  Filled 2018-03-03: qty 0.3

## 2018-03-03 MED ORDER — ASPIRIN EC 81 MG PO TBEC
81.0000 mg | DELAYED_RELEASE_TABLET | Freq: Every day | ORAL | Status: DC
  Administered 2018-03-04: 81 mg via ORAL
  Filled 2018-03-03: qty 1

## 2018-03-03 MED ORDER — INSULIN ASPART 100 UNIT/ML ~~LOC~~ SOLN
0.0000 [IU] | SUBCUTANEOUS | Status: DC
  Administered 2018-03-03 (×2): 5 [IU] via SUBCUTANEOUS

## 2018-03-03 MED ORDER — ONDANSETRON HCL 4 MG/2ML IJ SOLN
4.0000 mg | Freq: Four times a day (QID) | INTRAMUSCULAR | Status: DC | PRN
  Administered 2018-03-03 (×2): 4 mg via INTRAVENOUS
  Filled 2018-03-03 (×2): qty 2

## 2018-03-03 MED ORDER — DIPHENHYDRAMINE HCL 50 MG/ML IJ SOLN
INTRAMUSCULAR | Status: AC
  Filled 2018-03-03: qty 1

## 2018-03-03 MED ORDER — DIPHENHYDRAMINE HCL 50 MG/ML IJ SOLN
INTRAMUSCULAR | Status: DC | PRN
  Administered 2018-03-03: 25 mg via INTRAVENOUS

## 2018-03-03 MED ORDER — CLOPIDOGREL BISULFATE 75 MG PO TABS
75.0000 mg | ORAL_TABLET | Freq: Every day | ORAL | Status: DC
  Administered 2018-03-03 – 2018-03-04 (×2): 75 mg via ORAL
  Filled 2018-03-03 (×3): qty 1

## 2018-03-03 MED ORDER — LIDOCAINE VISCOUS HCL 2 % MT SOLN
OROMUCOSAL | Status: AC
  Filled 2018-03-03: qty 15

## 2018-03-03 MED ORDER — FENTANYL CITRATE (PF) 100 MCG/2ML IJ SOLN
INTRAMUSCULAR | Status: DC | PRN
  Administered 2018-03-03: 25 ug via INTRAVENOUS
  Administered 2018-03-03 (×3): 12.5 ug via INTRAVENOUS

## 2018-03-03 MED ORDER — INSULIN GLARGINE 100 UNIT/ML ~~LOC~~ SOLN
20.0000 [IU] | Freq: Every day | SUBCUTANEOUS | Status: DC
  Administered 2018-03-03: 20 [IU] via SUBCUTANEOUS
  Filled 2018-03-03: qty 0.2

## 2018-03-03 MED ORDER — MIDAZOLAM HCL (PF) 10 MG/2ML IJ SOLN
INTRAMUSCULAR | Status: DC | PRN
  Administered 2018-03-03: 2 mg via INTRAVENOUS
  Administered 2018-03-03 (×3): 1 mg via INTRAVENOUS

## 2018-03-03 MED ORDER — SODIUM CHLORIDE 0.9 % IV SOLN
INTRAVENOUS | Status: DC
  Administered 2018-03-03: 500 mL via INTRAVENOUS

## 2018-03-03 MED ORDER — FENTANYL CITRATE (PF) 100 MCG/2ML IJ SOLN
INTRAMUSCULAR | Status: AC
  Filled 2018-03-03: qty 2

## 2018-03-03 MED ORDER — MIDAZOLAM HCL (PF) 5 MG/ML IJ SOLN
INTRAMUSCULAR | Status: AC
  Filled 2018-03-03: qty 2

## 2018-03-03 MED ORDER — LIDOCAINE VISCOUS HCL 2 % MT SOLN
OROMUCOSAL | Status: DC | PRN
  Administered 2018-03-03: 10 mL via OROMUCOSAL

## 2018-03-03 MED ORDER — INSULIN ASPART 100 UNIT/ML ~~LOC~~ SOLN
5.0000 [IU] | Freq: Three times a day (TID) | SUBCUTANEOUS | Status: DC
  Administered 2018-03-03 – 2018-03-04 (×3): 5 [IU] via SUBCUTANEOUS

## 2018-03-03 MED ORDER — BUTAMBEN-TETRACAINE-BENZOCAINE 2-2-14 % EX AERO
INHALATION_SPRAY | CUTANEOUS | Status: DC | PRN
  Administered 2018-03-03: 1 via TOPICAL

## 2018-03-03 MED ORDER — INSULIN ASPART 100 UNIT/ML ~~LOC~~ SOLN
0.0000 [IU] | Freq: Three times a day (TID) | SUBCUTANEOUS | Status: DC
  Administered 2018-03-03: 3 [IU] via SUBCUTANEOUS
  Administered 2018-03-03 – 2018-03-04 (×2): 2 [IU] via SUBCUTANEOUS

## 2018-03-03 NOTE — Progress Notes (Signed)
Inpatient Diabetes Program Recommendations  AACE/ADA: New Consensus Statement on Inpatient Glycemic Control (2015)  Target Ranges:  Prepandial:   less than 140 mg/dL      Peak postprandial:   less than 180 mg/dL (1-2 hours)      Critically ill patients:  140 - 180 mg/dL   Lab Results  Component Value Date   GLUCAP 244 (H) 03/03/2018   HGBA1C 13.7 (H) 03/03/2018    Met with patient and informed her that her Hgb A1c had increased from 10.8% last year to 13.7%. Explained what an A1c is and what it measures. Reminded patient that goal A1c is 7% or less per ADA standards to prevent both acute and long-term complications. Explained to patient the extreme importance of good glucose control at home.   Asked patient who orders her insulin at home. She stated she had most recently seen a female endo in town but is no longer seeing her. Patient was not sure if it was the U-500 insulin that she takes (that was what was documented in chart from endo early 2018). States she takes it twice a day, not the U-500 80 units tid as documented. Asked about checking her blood sugar at home and said she might check 1-2 times / week. Asked if she had all the supplies needed and she stated she needs more lancets and strips as she is out.  MD - upon d/c home please order script for lancets/strips. Epic order [id number]   Will provide patient with list of area endocrinologists. Emphasized with her the importance of good diabetes management and the need to make a new appointment with an endocrinologist upon d/c.   -- Will follow during hospitalization.--  Jonna Clark RN, MSN Diabetes Coordinator Inpatient Glycemic Control Team Team Pager: [phone] (8am-5pm)

## 2018-03-03 NOTE — Progress Notes (Addendum)
- PROGRESS NOTE    Jessica Breedingancy Marksberry   QMV:[id number]:[id number]  DOB: 11-26-1965  DOA: 03/02/2018 PCP: Abigail MiyamotoPerry, Lawrence Edward, MD   Brief Narrative:  [name] is a [age] y/ with DM, HTN, HLD, Hypothyroidism, CVA who is non compliant and presents with left sided weakness, numbness and blurred vision. Apparently symptoms started on Saturday night but when she woke up on Sunday morning she was feeling better so decided not to seek for help. However, she has had multiple falls (5 times).  She had previous CVA with left-sided weakness.  Now the weakness is worse than baseline.  There was also possible witnessed focal seizure activity by family.  Patient was [facility] and [facility] with head CT CT angiogram as well as MRI of the brain.  She was found to have a watershed CVA on the right posterior and ACA territory.  There was recommendations for TEE.  The Hospitalist spoke with Dr. SwazilandJordan of cardiology who accepted to do TEE was patient arrives here. She has been started on Keppra prior to arrival based on the recommendation.  Subjective:  Left side is weak. She feels nauseated. She states she stopped all her medications other than her Insulin because she thought she was on too much medications and is trying to find a new PCP. She is not sure how much insulin she takes although she draws it up herself.     Assessment & Plan:   Principal Problem:   Acute CVA (cerebrovascular accident) - stroke team assisting with management - likely due to uncontrolled co morbidities - on high dose statin   - TEE results pending  Active Problems:   Diabetes Mellitus type 2 - start Lantus and cont SSI- hold U 500 - dietician consult  Nausea - Zofran PRN- slow IVF  CKD3 - folow     HTN (hypertension) - hold Coreg, Norvasc, Hydralazine, Lisinopril, Chlorthalidone to allow permissive HTN   Obesity, morbid, BMI 40.0-49.9   Body mass index is 39 kg/m.   Depression/anxiety -cont Cymbalta  Seizure  disorder - Keppra  Non- complaince - dicussed importance of staying compliant with medications   DVT prophylaxis: Lovenox Code Status: Full code Family Communication:  Disposition Plan: Pt recommends CIR consult but patient still very sedated after TEE-  Consultants:   Neuro Procedures:    TEE Antimicrobials:  Anti-infectives (From admission, onward)   None       Objective: Vitals:   03/03/18 1240 03/03/18 1250 03/03/18 1254 03/03/18 1316  BP: (!) 146/82  (!) 153/85 129/70  Pulse: 94 88 94 89  Resp: 20 17 17 18   Temp:    98.6 F (37 C)  TempSrc:    Oral  SpO2: 95% 94% 97% 94%  Weight:      Height:        Intake/Output Summary (Last 24 hours) at 03/03/2018 1610 Last data filed at 03/03/2018 1400 Gross per 24 hour  Intake 942.6 ml  Output -  Net 942.6 ml   Filed Weights   03/02/18 1919  Weight: 106.3 kg    Examination: General exam: Appears comfortable  HEENT: PERRLA, oral mucosa moist, no sclera icterus or thrush Respiratory system: Clear to auscultation. Respiratory effort normal. Cardiovascular system: S1 & S2 heard, RRR.   Gastrointestinal system: Abdomen soft, non-tender, nondistended. Normal bowel sounds. Central nervous system: Alert and oriented. 4/5 weakness in left arm and leg with decreased sensation in left leg. Extremities: No cyanosis, clubbing or edema Skin: No rashes or ulcers  Psychiatry:  Flat affect    Data Reviewed: I have personally reviewed following labs and imaging studies  CBC: Recent Labs  Lab 03/02/18 2149  WBC 8.7  HGB 13.4  HCT 41.0  MCV 85.6  PLT 329   Basic Metabolic Panel: Recent Labs  Lab 03/02/18 2149 03/03/18 0934  NA  --  135  K  --  3.7  CL  --  104  CO2  --  24  GLUCOSE  --  301*  BUN  --  19  CREATININE 1.57* 1.48*  CALCIUM  --  8.5*   GFR: Estimated Creatinine Clearance: 55.1 mL/min (A) (by C-G formula based on SCr of 1.48 mg/dL (H)). Liver Function Tests: Recent Labs  Lab 03/03/18 0934   AST 36  ALT 35  ALKPHOS 184*  BILITOT 0.6  PROT 5.8*  ALBUMIN 2.4*   No results for input(s): LIPASE, AMYLASE in the last 168 hours. No results for input(s): AMMONIA in the last 168 hours. Coagulation Profile: No results for input(s): INR, PROTIME in the last 168 hours. Cardiac Enzymes: No results for input(s): CKTOTAL, CKMB, CKMBINDEX, TROPONINI in the last 168 hours. BNP (last 3 results) No results for input(s): PROBNP in the last 8760 hours. HbA1C: Recent Labs    03/03/18 0439  HGBA1C 13.7*   CBG: Recent Labs  Lab 03/02/18 2141 03/03/18 0051 03/03/18 0324 03/03/18 0809 03/03/18 1343  GLUCAP 204* 278* 282* 260* 244*   Lipid Profile: Recent Labs    03/03/18 0439  CHOL 460*  HDL 40*  LDLCALC UNABLE TO CALCULATE IF TRIGLYCERIDE OVER 400 mg/dL  TRIG 1,610*  CHOLHDL 96.0   Thyroid Function Tests: No results for input(s): TSH, T4TOTAL, FREET4, T3FREE, THYROIDAB in the last 72 hours. Anemia Panel: No results for input(s): VITAMINB12, FOLATE, FERRITIN, TIBC, IRON, RETICCTPCT in the last 72 hours. Urine analysis: No results found for: COLORURINE, APPEARANCEUR, LABSPEC, PHURINE, GLUCOSEU, HGBUR, BILIRUBINUR, KETONESUR, PROTEINUR, UROBILINOGEN, NITRITE, LEUKOCYTESUR Sepsis Labs: @LABRCNTIP (procalcitonin:4,lacticidven:4) )No results found for this or any previous visit (from the past 240 hour(s)).       Radiology Studies: No results found.    Scheduled Meds: . aspirin  325 mg Oral Daily  . atorvastatin  80 mg Oral QHS  . DULoxetine  60 mg Oral Daily  . enoxaparin (LOVENOX) injection  40 mg Subcutaneous Q24H  . insulin aspart  0-9 Units Subcutaneous TID WC  . insulin aspart  5 Units Subcutaneous TID WC  . [START ON 03/04/2018] insulin glargine  30 Units Subcutaneous Daily  . levETIRAcetam  500 mg Oral BID   Continuous Infusions: . sodium chloride 75 mL/hr at 03/03/18 0300     LOS: 1 day    Time spent in minutes: 35    Calvert Cantor, MD [facility] Pager: [email or web address] Password [email or web address] 03/03/2018, 4:10 PM

## 2018-03-03 NOTE — Op Note (Signed)
Transesophageal Echo  Patient was sedated with Fentanyl(62.5 mg)  and Versed (5 mgg) and benadryl (25 mg) intravenously. Throat was anesthetized with cetacaine spray Patient was very relaxed (snoring, sleeping when probe insertion not attempted).   With each attempt to advance TEE probe into esophagus she became very agitated, combative.     Procedure aborted.  Hx of sleep apnea.  COncern for respiratory complications with furhter sedation without airway protection.  Will need to reschedule with general  Anesthesia.    Dietrich PatesPaula Romeka Scifres

## 2018-03-03 NOTE — Progress Notes (Signed)
Inpatient Diabetes Program Recommendations  AACE/ADA: New Consensus Statement on Inpatient Glycemic Control (2015)  Target Ranges:  Prepandial:   less than 140 mg/dL      Peak postprandial:   less than 180 mg/dL (1-2 hours)      Critically ill patients:  140 - 180 mg/dL   Lab Results  Component Value Date   GLUCAP 260 (H) 03/03/2018   HGBA1C 13.7 (H) 03/03/2018    Results for Minus BreedingCARDIN, [name] (MRN [id number]) as of 03/03/2018 09:18  Ref. Range 03/03/2018 00:51 03/03/2018 03:24 03/03/2018 08:09  Glucose-Capillary Latest Ref Range: 70 - 99 mg/dL 409278 (H) 811282 (H) 914260 (H)    DM2 since 2009  Home DM meds: U-500  80 units tid  Current DM meds: Lantus 20 units daily                                Novolog sensitive scale (0-9 units) tid ac   Per last office notes patient [facility]. She is on U-500 and takes 80 units tid. Per office notes at that point she checks her sugar a few times a week. A1c at 07/09/16 visit was 10.8%.   A1c this admission has increased to 13.7% (blood sugar average of 346).   Will speak to patient today about her increased A1c. Will monitor patient as most likely will need more insulin depending on blood sugar levels.   -- Will follow during hospitalization.--  Jamelle RushingSusan Anaiya Wisinski RN, MSN Diabetes Coordinator Inpatient Glycemic Control Team Team Pager: [phone] (8am-5pm)

## 2018-03-03 NOTE — Care Management Note (Signed)
Case Management Note  Patient Details  Name: [name] MRN: [id number] Date of Birth: 07-07-1965  Subjective/Objective:      Pt admitted with CVA. She is from home with her spouse. Per pt he is not able to provide 24 hour supervision. She states she does have children that can assist.  DME: walker Pt denies issues with transportation.  Pt denies issues obtaining her medications.              Action/Plan: OT recommending HH services. Awaiting PT eval. CM following for d/c needs, physician orders.   Expected Discharge Date:                  Expected Discharge Plan:  Home w Home Health Services  In-House Referral:     Discharge planning Services  CM Consult  Post Acute Care Choice:    Choice offered to:     DME Arranged:    DME Agency:     HH Arranged:    HH Agency:     Status of Service:  In process, will continue to follow  If discussed at Long Length of Stay Meetings, dates discussed:    Additional Comments:  Kermit BaloKelli F Feather Berrie, RN 03/03/2018, 2:20 PM

## 2018-03-03 NOTE — Progress Notes (Signed)
PT Cancellation Note  Patient Details Name: [name] MRN: [id number] DOB: 02-Aug-1965   Cancelled Treatment:    Reason Eval/Treat Not Completed: Patient at procedure or test/unavailable; patient off the floor for TEE.  Will attempt later as time permits.   Elray McgregorCynthia  03/03/2018, 11:20 AM Sheran Lawlessyndi , PT Acute Rehabilitation Services [phone] 03/03/2018

## 2018-03-03 NOTE — Evaluation (Addendum)
Occupational Therapy Evaluation Patient Details Name: [name] MRN: [id number] DOB: 11-15-1965 Today's Date: 03/03/2018    History of Present Illness Taletha Twiford is a 52 y.o. female with a history of left-sided weakness that started on Monday.  MRI revealed Right side of ACA/MCA CVA. Pt also has PMH: that includes but is not limited to the following: severe intercranial atherosclerotic disease, CVA w/ L sided weakness, HTN, Hyperlipidemia, fibromyalgia, headaches, DM, obesity & sleep apnea.    Clinical Impression   Pt admitted w/ CVA dx as above currently demonstrating increased deficits in L UE/LE weakness (more so than her normal) impacting her ability to perform ADL and self care tasks as well as functional mobility (Please refer to OT problem list below). Pt reports PTA being Mod I ADL's and was ambulating w/o AD, she is currently Min A overall and should benefit from acute OT to address deficits.    Follow Up Recommendations  Home health OT;Supervision/Assistance - 24 hour;Supervision - Intermittent    Equipment Recommendations  Other (comment)(TBD)    Recommendations for Other Services       Precautions / Restrictions Precautions Precautions: Fall Precaution Comments: LLE weakness, ocassionally drags L foot/toes Restrictions Weight Bearing Restrictions: No      Mobility Bed Mobility Overal bed mobility: Modified Independent             General bed mobility comments: Increased time  Transfers Overall transfer level: Needs assistance Equipment used: Rolling walker (2 wheeled) Transfers: Sit to/from [employer] Sit to Stand: Min guard;Min assist Stand pivot transfers: Min assist       General transfer comment: Pt requires consistent VC's for safety and sequencing with RW as she is noted to drag L foot/toes at times and places RW off to side despite her balance being off.    Balance Overall balance assessment: Needs assistance;Mild deficits  observed, not formally tested Sitting-balance support: No upper extremity supported;Feet supported Sitting balance-Leahy Scale: Fair     Standing balance support: Single extremity supported;Bilateral upper extremity supported;During functional activity Standing balance-Leahy Scale: Poor Standing balance comment: Pt w/ LLE weakness and is noted to drag her foot/toes at times and scissor x2 during OT treat,emt session w/o appearing to notice. ?baseline as pt has had previous CVA w/ L sided weakness. PTA pt was not using RW or other AD                           ADL either performed or assessed with clinical judgement   ADL Overall ADL's : Needs assistance/impaired Eating/Feeding: NPO Eating/Feeding Details (indicate cue type and reason): Awaiting testing Grooming: Wash/dry hands;Standing;Minimal assistance Grooming Details (indicate cue type and reason): VC's for safety and sequencing with RW during functional activity Upper Body Bathing: Sitting;Min guard   Lower Body Bathing: Minimal assistance;Sit to/from stand   Upper Body Dressing : Set up;Sitting   Lower Body Dressing: Min guard;Minimal assistance;Sit to/from stand   Toilet Transfer: Minimal assistance;RW;BSC;Ambulation;Cueing for sequencing;Cueing for safety;Stand-pivot Toilet Transfer Details (indicate cue type and reason): 3:1 over toilet, assist secondary to LLE weakness Toileting- Clothing Manipulation and Hygiene: Minimal assistance;Sit to/from stand       Functional mobility during ADLs: Minimal assistance;Cueing for safety;Cueing for sequencing;Rolling walker General ADL Comments: Pt was educated in OT following assessment today and then participated in ADL retraining session with focus on toileting, grooming and functional mobility/transfers from bed/toilet/recliner chair. Pt is with noted deficits and generalized weakness LUE and LLE  and should benefit from acute OT followed by HHOT and Min A PRN. PTA pt was  independent and did not use RW or other AD.     Vision Baseline Vision/History: Wears glasses Wears Glasses: At all times Patient Visual Report: Blurring of vision       Perception     Praxis      Pertinent Vitals/Pain Pain Assessment: No/denies pain     Hand Dominance Right   Extremity/Trunk Assessment Upper Extremity Assessment Upper Extremity Assessment: Generalized weakness;Overall WFL for tasks assessed;LUE deficits/detail LUE Deficits / Details: LUE gross assessment is overall 4/5 vs 5/5 on R LUE Sensation: (Intact to light touch - WFL) LUE Coordination: (WFL)   Lower Extremity Assessment Lower Extremity Assessment: Defer to PT evaluation       Communication Communication Communication: No difficulties   Cognition Arousal/Alertness: Awake/alert Behavior During Therapy: Flat affect Overall Cognitive Status: History of cognitive impairments - at baseline(WFL's for tasks assessed however pt reports h/o of difficulty memory since previous stroke)                                 General Comments: Pt appears frustrated wth questions and flat affect   General Comments       Exercises     Shoulder Instructions      Home Living Family/patient expects to be discharged to:: Private residence Living Arrangements: Spouse/significant other;Children Available Help at Discharge: Family;Available PRN/intermittently Type of Home: House Home Access: Stairs to enter Enter[employer] CorporationEntrance Stairs-Number of Steps: 4 STE  Entrance Stairs-Rails: Left Home Layout: One level     Bathroom Shower/Tub: Walk-in Pensions consultantshower;Curtain   Bathroom Toilet: Standard     Home Equipment: Environmental consultantWalker - 2 wheels;Grab bars - toilet;Grab bars - tub/shower          Prior Functioning/Environment Level of Independence: Independent        Comments: Pt reports not using assistive devices and does not use RW.        OT Problem List: Decreased strength;Decreased knowledge of use of DME or  AE;Impaired UE functional use;Impaired balance (sitting and/or standing);Decreased knowledge of precautions;Obesity      OT Treatment/Interventions: Self-care/ADL training;DME and/or AE instruction;Therapeutic activities;Neuromuscular education;Patient/family education;Cognitive remediation/compensation;Balance training(Balance to be addressed during ADL's & functional mobility )    OT Goals(Current goals can be found in the care plan section) Acute Rehab OT Goals Patient Stated Goal: Go home, be able to eat soon (waiting for procedure) OT Goal Formulation: With patient Time For Goal Achievement: 03/17/18 Potential to Achieve Goals: Good  OT Frequency: Min 3X/week   Barriers to D/C:            Co-evaluation              AM-PAC PT "6 Clicks" Daily Activity     Outcome Measure Help from another person eating meals?: None Help from another person taking care of personal grooming?: A Little Help from another person toileting, which includes using toliet, bedpan, or urinal?: A Little Help from another person bathing (including washing, rinsing, drying)?: A Lot Help from another person to put on and taking off regular upper body clothing?: A Little Help from another person to put on and taking off regular lower body clothing?: A Little 6 Click Score: 18   End of Session Equipment Utilized During Treatment: Gait belt;Rolling walker Nurse Communication: Mobility status;Other (comment)(Pt requesting nausea medication)  Activity Tolerance: Patient tolerated treatment well Patient  left: in chair;with call bell/phone within reach;with nursing/sitter in room  OT Visit Diagnosis: Unsteadiness on feet (R26.81);Muscle weakness (generalized) (M62.81);Other symptoms and signs involving the nervous system (Z61.096)                Time: 0454-0981 OT Time Calculation (min): 27 min Charges:  OT General Charges $OT Visit: 1 Visit OT Evaluation $OT Eval Moderate Complexity: 1 Mod OT  Treatments $Self Care/Home Management : 8-22 mins   Javin Nong Beth Dixon, OTR/L 03/03/2018, 9:47 AM

## 2018-03-03 NOTE — Progress Notes (Addendum)
STROKE TEAM PROGRESS NOTE  HPI: [name] is a 52 y.o. female with a history of left-sided weakness that started on Monday.  On Monday, she reports a significant headache, and she began having intermittent episodes of the left arm and leg abnormal movements.  She states that it would just be flopping about.  When I asked her to specify whether it was "jerking" or flopping she says that it was just flopping about.  These episodes would last for 10 to 15 minutes before improving on their own.  She then had more difficulty with weakness of her left side which is chronically affected from a previous stroke and therefore presented to [facility] where an MRI which reveals a left ACA territory versus ACA/MCA watershed territory infarct. CTA revealed multifocal intracranial atherosclerotic disease.  A TEE was recommended and for that reason she was transferred to [facility]. LKW: Monday tpa given?: no, outside of window  INTERVAL HISTORY Her RN is at the bedside.  She is just back from TEE. Results not available during rounds.   Vitals:   03/03/18 1232 03/03/18 1240 03/03/18 1250 03/03/18 1254  BP: (!) 149/91 (!) 146/82  (!) 153/85  Pulse: 97 94 88 94  Resp: 20 20 17 17   Temp: 98.4 F (36.9 C)     TempSrc: Oral     SpO2: 95% 95% 94% 97%  Weight:      Height:        CBC:  Recent Labs  Lab 03/02/18 2149  WBC 8.7  HGB 13.4  HCT 41.0  MCV 85.6  PLT 329    Basic Metabolic Panel:  Recent Labs  Lab 03/02/18 2149 03/03/18 0934  NA  --  135  K  --  3.7  CL  --  104  CO2  --  24  GLUCOSE  --  301*  BUN  --  19  CREATININE 1.57* 1.48*  CALCIUM  --  8.5*   Lipid Panel:     Component Value Date/Time   CHOL 460 (H) 03/03/2018 0439   TRIG 1,031 (H) 03/03/2018 0439   HDL 40 (L) 03/03/2018 0439   CHOLHDL 11.5 03/03/2018 0439   VLDL UNABLE TO CALCULATE IF TRIGLYCERIDE OVER 400 mg/dL 16/01/9603 5409   LDLCALC UNABLE TO CALCULATE IF TRIGLYCERIDE OVER 400 mg/dL 81/19/1478 2956    OZHY8M:  Lab Results  Component Value Date   HGBA1C 13.7 (H) 03/03/2018   Urine Drug Screen: No results found for: LABOPIA, COCAINSCRNUR, LABBENZ, AMPHETMU, THCU, LABBARB  Alcohol Level No results found for: ETH  IMAGING No results found.  EEG Normal electroencephalogram, awake and drowsy. There are no focal lateralizing or epileptiform features.  TEE pending   PHYSICAL EXAM Obese middle aged Caucasian lady not in distress. . Afebrile. Head is nontraumatic. Neck is supple without bruit.    Cardiac exam no murmur or gallop. Lungs are clear to auscultation. Distal pulses are well felt. Neurology Exam : drowsy but can be aroused, oriented x 2.Awake alert oriented x 3 normal speech and language. Mild left lower face asymmetry. Tongue midline. Mild LUE and LLE drift. Mild diminished fine finger movements on left. Orbits right over left upper extremity. Mild left grip weak.. Normal sensation . Normal coordination.  ASSESSMENT/PLAN [name] is a 52 y.o. female with history of previous stroke non complaint with meds, HTN, HLD, DB, hypothyroidism, obesity presenting to [facility] with left-sided weakness that started on 02/28/2018. Transferred to [facility] for TEE. 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 Given large vessel intracranial atherosclerosis, patient should be treated with aspirin 81 mg and clopidogrel 75 mg orally every day x 3 months for secondary stroke prevention. After 3 months, change to plavix alone. Long-term dual antiplatelets are contraindicated due to risk for intracerebral hemorrhage.   Therapy recommendations:  HH OT,  PT CIR - consult in place  Disposition:  pending   Seizure  EEG - no seizure  Hypertensive Urgency  B{ as jogj as 197/101 . Permissive hypertension (OK if < 220/120) but gradually normalize in 5-7 days . Long-term BP goal normotensive  Hyperlipidemia  Home meds:  lipitor 20  Now on lipitor 80 in hospital  LDL UTC, goal < 70  Continue statin at discharge  Diabetes type II  HgbA1c 13.7, goal < 7.0  Uncontrolled  Diabetes coordinator following  Other Stroke Risk Factors  Obesity, Body mass index is 39 kg/m., recommend weight loss, diet and exercise as appropriate   Hx stroke/TIA  Obstructive sleep apnea  Other Active Problems  ESRD   Hypothyroidism   restless leg syndrome   Hospital day # 1  Annie MainSharon Biby, MSN, APRN, ANVP-BC, AGPCNP-BC Advanced Practice Stroke Nurse [facility] See Amion for Schedule & Pager information 03/03/2018 1:16 PM  I have personally examined this patient, reviewed notes, independently viewed imaging studies, participated in medical decision making and plan of care.ROS completed by me personally and pertinent positives fully documented  I have made any additions or clarifications directly to the above note. Agree with note above. She has presented with left hemiparesis due to right ACA infarct likely due to intracranial atherosclerosis. Recommend dual] therapy of aspirin and Plavix for 3 months with aggressive risk factor modification. Follow TEE results. She will likely need current outpatient therapy. Discussed with Dr.Rizwan .Greater than 50% time during this 35 minute visit was spent on counseling and coordination of care about her stroke and hemiplegia and answering questions  Jessica HeadyPramod Hjalmer Iovino, MD Medical Director [facility] Pager: [phone] 03/03/2018 5:09 PM  To contact Stroke Continuity provider, please refer to [email or web address]. After hours, contact General Neurology

## 2018-03-03 NOTE — Progress Notes (Signed)
TRH provider notified of pt high CBG levels, plans to initiate order for sliding scale insulin, when system downtime ends. [name]

## 2018-03-03 NOTE — Progress Notes (Signed)
    [facility] has been requested to perform a transesophageal echocardiogram on Minus BreedingNancy Pinela for CVA.  After careful review of history and examination, the risks and benefits of transesophageal echocardiogram have been explained including risks of esophageal damage, perforation (1:10,000 risk), bleeding, pharyngeal hematoma as well as other potential complications associated with conscious sedation including aspiration, arrhythmia, respiratory failure and death. Alternatives to treatment were discussed, questions were answered. Patient is willing to proceed.   Robbie LisBrittainy Simmons, PA-C  03/03/2018 9:19 AM

## 2018-03-03 NOTE — Progress Notes (Signed)
EEG completed, results pending. 

## 2018-03-03 NOTE — Evaluation (Signed)
Physical Therapy Evaluation Patient Details Name: [name] MRN: [id number] DOB: 1965-07-23 Today's Date: 03/03/2018   History of Present Illness  Jessica Breedingancy Prosise is a 52 y.o. female with a history of left-sided weakness that started on Monday.  MRI revealed Right side of ACA/MCA CVA. 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   Clinical Impression  Patient presents with decreased independence and safety with mobility due to L side weakness, decreased proprioceptive awareness, decreased balance and high fall risk. She may be more impaired due to recent attempted TEE with medication given.  Currently mod A for in room ambulation with walker.  May need CIR level rehab prior to d/c home unless improved once medications wear off.  PT to follow acutely and will update d/c recommendations PRN.    Follow Up Recommendations CIR    Equipment Recommendations  Rolling walker with 5" wheels    Recommendations for Other Services Rehab consult     Precautions / Restrictions Precautions Precautions: Fall      Mobility  Bed Mobility Overal bed mobility: Modified Independent             General bed mobility comments: Increased time  Transfers Overall transfer level: Needs assistance Equipment used: None Transfers: Sit to/from Stand Sit to Stand: Min assist         General transfer comment: assist for balance to stand from EOB, attempted HHA, but still unsteady so used walker  Ambulation/Gait Ambulation/Gait assistance: Mod assist Gait Distance (Feet): 20 Feet Assistive device: Rolling walker (2 wheeled) Gait Pattern/deviations: Step-to pattern;Decreased step length - left;Decreased stance time - left;Trunk flexed;Shuffle;Decreased dorsiflexion - left     General Gait Details: dragging L foot and at times  unable to progress without assist for R weight shift and cues.  Assist for walker to turn in room and cues for positioning.   Stairs            Wheelchair Mobility    Modified Rankin (Stroke Patients Only) Modified Rankin (Stroke Patients Only) Pre-Morbid Rankin Score: No symptoms Modified Rankin: Moderately severe disability     Balance Overall balance assessment: Needs assistance   Sitting balance-Leahy Scale: Fair       Standing balance-Leahy Scale: Poor Standing balance comment: unsteady trying to stand without UE support                             Pertinent Vitals/Pain Pain Assessment: No/denies pain    Home Living Family/patient expects to be discharged to:: Private residence Living Arrangements: Spouse/significant other;Children Available Help at Discharge: Family;Available PRN/intermittently Type of Home: House Home Access: Stairs to enter Entrance Stairs-Rails: Left Entrance Stairs-Number of Steps: 4 STE  Home Layout: One level Home Equipment: Walker - 2 wheels;Grab bars - toilet;Grab bars - tub/shower      Prior Function Level of Independence: Independent         Comments: worked in [employer]     Hand Dominance        Extremity/Trunk Assessment   Upper Extremity Assessment Upper Extremity Assessment: Defer to OT evaluation    Lower Extremity Assessment Lower Extremity Assessment: LLE deficits/detail LLE Deficits / Details: AROM WFL, strength hip flexion 3+/5, knee extension 4-/5, ankle DF 3+/5,  LLE Sensation: decreased proprioception;decreased light touch(absent proprioception at toes, intact with ankle testing) LLE  Coordination: decreased gross motor(with heel to shin)       Communication   Communication: No difficulties  Cognition Arousal/Alertness: Awake/alert Behavior During Therapy: Flat affect Overall Cognitive Status: Within Functional Limits for tasks assessed                                  General Comments: slow to respond and minimal verbalizations with flat affect (s/p medications for TEE making her sleepy per pt)      General Comments General comments (skin integrity, edema, etc.): Spoke with RN due to pt s/p attempted TEE and medications may increase difficulty with mobility.  She reports patientm more stable with walker earlier today as she had walked her to the bathroom.     Exercises     Assessment/Plan    PT Assessment Patient needs continued PT services  PT Problem List Decreased strength;Decreased mobility;Decreased activity tolerance;Decreased balance;Decreased knowledge of use of DME;Impaired sensation;Decreased safety awareness;Decreased knowledge of precautions;Decreased coordination       PT Treatment Interventions DME instruction;Therapeutic activities;Gait training;Therapeutic exercise;Patient/family education;Balance training;Functional mobility training;Stair training;Neuromuscular re-education    PT Goals (Current goals can be found in the Care Plan section)  Acute Rehab PT Goals Patient Stated Goal: agreeable to rehab if needed PT Goal Formulation: With patient Time For Goal Achievement: 03/17/18 Potential to Achieve Goals: Good    Frequency Min 4X/week   Barriers to discharge        Co-evaluation               AM-PAC PT "6 Clicks" Daily Activity  Outcome Measure Difficulty turning over in bed (including adjusting bedclothes, sheets and blankets)?: A Little Difficulty moving from lying on back to sitting on the side of the bed? : A Little Difficulty sitting down on and standing up from a chair with arms (e.g., wheelchair, bedside commode, etc,.)?: Unable Help needed moving to and from a bed to chair (including a wheelchair)?: A Lot Help needed walking in hospital room?: A Lot Help needed climbing 3-5 steps with a railing? : Total 6 Click Score: 12    End of Session Equipment Utilized During Treatment: Gait belt Activity  Tolerance: Patient limited by fatigue Patient left: with bed alarm set;in bed;with call bell/phone within reach Nurse Communication: Mobility status PT Visit Diagnosis: Other abnormalities of gait and mobility (R26.89);Hemiplegia and hemiparesis Hemiplegia - Right/Left: Left Hemiplegia - dominant/non-dominant: Non-dominant Hemiplegia - caused by: Cerebral infarction    Time: 1422-1449 PT Time Calculation (min) (ACUTE ONLY): 27 min   Charges:   PT Evaluation $PT Eval Moderate Complexity: 1 Mod PT Treatments $Gait Training: 8-22 mins        Sheran Lawless, PT Acute Rehabilitation Services [phone] 03/03/2018   [name] 03/03/2018, 3:21 PM

## 2018-03-03 NOTE — Progress Notes (Signed)
SLP Cancellation Note  Patient Details Name: [name] MRN: [id number] DOB: 1965-06-06   Cancelled treatment:       Reason Eval/Treat Not Completed: Patient at procedure or test/unavailable. Pt currently off unit for TEE. Will continue efforts to complete Cognitive-Linguistic evaluation.  Sherilynn Dieu B. Murvin NatalBueche, [facility] HospitalMSP, CCC-SLP Speech Language Pathologist [phone]  Leigh AuroraBueche, Niomie Englert Brown 03/03/2018, 12:52 PM

## 2018-03-03 NOTE — Progress Notes (Signed)
Rehab Admissions Coordinator Note:  Patient was screened by Trish MageLogue, Rea Reser M for appropriateness for an Inpatient Acute Rehab Consult.  At this time, OT is recommending HH for follow up.  PT consult is pending.  I will await PT consult and then determine rehab venue needed at discharge.  Trish MageLogue, Lindell Renfrew M 03/03/2018, 3:24 PM  I can be reached at [phone].

## 2018-03-03 NOTE — Procedures (Signed)
ELECTROENCEPHALOGRAM REPORT   Patient: [name]       Room #: [id number] EEG No. ID: [id number] Age: 52 y.o.        Sex: female Referring Physician: Rizwan Report Date:  03/03/2018        Interpreting Physician: Thana FarrEYNOLDS, Adenike Shidler  History: [name] is an 52 y.o. female with involuntary left sided movements  Medications:  ASA, Lipitor, Coreg, Cymbalta, Apresoline, Insulin, Keppra  Conditions of Recording:  This is a 21 channel routine scalp EEG performed with bipolar and monopolar montages arranged in accordance to the international 10/20 system of electrode placement. One channel was dedicated to EKG recording.  The patient is in the awake and drowsy states.  Description:  The waking background activity consists of a low voltage, symmetrical, fairly well organized, 9-10 Hz alpha activity, seen from the parieto-occipital and posterior temporal regions.  Low voltage fast activity, poorly organized, is seen anteriorly and is at times superimposed on more posterior regions.  A mixture of theta and alpha rhythms are seen from the central and temporal regions. The patient drowses briefly with slowing to irregular, low voltage theta and beta activity.   Stage II sleep is not obtained. No epileptiform activity is noted.   Hyperventilation and intermittent photic stimulation were not performed.   IMPRESSION: Normal electroencephalogram, awake and drowsy. There are no focal lateralizing or epileptiform features.   Thana FarrLeslie Jaeceon Michelin, MD Neurology [phone] 03/03/2018, 1:06 PM

## 2018-03-03 NOTE — Interval H&P Note (Signed)
History and Physical Interval Note:  03/03/2018 9:19 AM  [name]  has presented today for surgery, with the diagnosis of STROKE  The various methods of treatment have been discussed with the patient and family. After consideration of risks, benefits and other options for treatment, the patient has consented to  Procedure(s): TRANSESOPHAGEAL ECHOCARDIOGRAM (TEE) (N/A) as a surgical intervention .  The patient's history has been reviewed, patient examined, no change in status, stable for surgery.  I have reviewed the patient's chart and labs.  Questions were answered to the patient's satisfaction.     Dietrich PatesPaula Biridiana Twardowski

## 2018-03-04 DIAGNOSIS — E039 Hypothyroidism, unspecified: Secondary | ICD-10-CM

## 2018-03-04 DIAGNOSIS — E1142 Type 2 diabetes mellitus with diabetic polyneuropathy: Secondary | ICD-10-CM

## 2018-03-04 DIAGNOSIS — I6389 Other cerebral infarction: Secondary | ICD-10-CM

## 2018-03-04 DIAGNOSIS — Z9114 Patient's other noncompliance with medication regimen: Secondary | ICD-10-CM

## 2018-03-04 DIAGNOSIS — Z794 Long term (current) use of insulin: Secondary | ICD-10-CM

## 2018-03-04 LAB — GLUCOSE, CAPILLARY
GLUCOSE-CAPILLARY: 182 mg/dL — AB (ref 70–99)
Glucose-Capillary: 179 mg/dL — ABNORMAL HIGH (ref 70–99)
Glucose-Capillary: 166 mg/dL — ABNORMAL HIGH (ref 70–99)

## 2018-03-04 MED ORDER — SENNOSIDES-DOCUSATE SODIUM 8.6-50 MG PO TABS: 1.0000 | ORAL_TABLET | Freq: Every evening | ORAL | 0 refills | Status: DC | PRN

## 2018-03-04 MED ORDER — INSULIN ASPART 100 UNIT/ML FLEXPEN: 1.0000 [IU] | PEN_INJECTOR | Freq: Three times a day (TID) | SUBCUTANEOUS | 11 refills | Status: DC

## 2018-03-04 MED ORDER — INSULIN ASPART 100 UNIT/ML ~~LOC~~ SOLN: 5.0000 [IU] | Freq: Three times a day (TID) | SUBCUTANEOUS | 11 refills | Status: DC

## 2018-03-04 MED ORDER — INSULIN GLARGINE 100 UNIT/ML SOLOSTAR PEN: 30.0000 [IU] | PEN_INJECTOR | Freq: Every day | SUBCUTANEOUS | 11 refills | Status: DC

## 2018-03-04 MED ORDER — CLOPIDOGREL BISULFATE 75 MG PO TABS: 75.0000 mg | ORAL_TABLET | Freq: Every day | ORAL | 0 refills | Status: DC

## 2018-03-04 MED ORDER — INSULIN ASPART 100 UNIT/ML ~~LOC~~ SOLN
0.0000 [IU] | Freq: Three times a day (TID) | SUBCUTANEOUS | Status: DC
  Administered 2018-03-04: 3 [IU] via SUBCUTANEOUS

## 2018-03-04 MED ORDER — INSULIN PEN NEEDLE 31G X 4 MM MISC: 1.0000 | Freq: Three times a day (TID) | 0 refills | Status: DC

## 2018-03-04 MED ORDER — ASPIRIN 81 MG PO TBEC: 81.0000 mg | DELAYED_RELEASE_TABLET | Freq: Every day | ORAL | 0 refills | Status: DC

## 2018-03-04 MED ORDER — LEVETIRACETAM 500 MG PO TABS: 500.0000 mg | ORAL_TABLET | Freq: Two times a day (BID) | ORAL | 0 refills | Status: DC

## 2018-03-04 MED ORDER — ATORVASTATIN CALCIUM 80 MG PO TABS: 80.0000 mg | ORAL_TABLET | Freq: Every day | ORAL | 0 refills | Status: DC

## 2018-03-04 MED ORDER — CARVEDILOL 12.5 MG PO TABS: 25.0000 mg | ORAL_TABLET | Freq: Two times a day (BID) | ORAL | Status: DC

## 2018-03-04 NOTE — Discharge Instructions (Signed)
F/u with Neurology in 1 months- [facility] See d/c instructions.  You will need to take the Aspirin and Plavix for 3 months after which you should be on Plavix alone. Keep a log of blood sugars and how much insulin given with each meal.  Start Lantus either tomorrow AM or tomorrow evening and continue taking every 24 hrs.  Do not suddenly stop Cymbalta and take it everyday as ordered and not "as needed".  You were cared for by a hospitalist during your hospital stay. If you have any questions about your discharge medications or the care you received while you were in the hospital after you are discharged, you can call the unit and asked to speak with the hospitalist on call if the hospitalist that took care of you is not available. Once you are discharged, your primary care physician will handle any further medical issues.   Please note that NO REFILLS for any discharge medications will be authorized once you are discharged, as it is imperative that you return to your primary care physician (or establish a relationship with a primary care physician if you do not have one) for your aftercare needs so that they can reassess your need for medications and monitor your lab values.  Please take all your medications with you for your next visit with your Primary MD. Please ask your Primary MD to get all Hospital records sent to his/her office. Please request your Primary MD to go over all hospital test results at the follow up.   If you experience worsening of your admission symptoms, develop shortness of breath, chest pain, suicidal or homicidal thoughts or a life threatening emergency, you must seek medical attention immediately by calling 911 or calling your MD.   Bonita QuinYou must read the complete instructions/literature along with all the possible adverse reactions/side effects for all the medicines you take including new medications that have been prescribed to you. Take new  medicines after you have completely understood and accpet all the possible adverse reactions/side effects.    Do not drive when taking pain medications or sedatives.     Do not take more than prescribed Pain, Sleep and Anxiety Medications   If you have smoked or chewed Tobacco in the last 2 yrs please stop. Stop any regular alcohol  and or recreational drug use.   Wear Seat belts while driving.

## 2018-03-04 NOTE — Progress Notes (Signed)
Occupational Therapy Treatment Patient Details Name: [name] MRN: [id number] DOB: 1965/10/25 Today's Date: 03/04/2018    History of present illness [name] is a 52 y.o. female with a history of left-sided weakness that started on Monday.  MRI revealed Right side of ACA/MCA CVA. 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    OT comments  Pt progressing towards acute OT goals. Focus of session was toilet transfer/pericare, functional mobility in the room, discussed shower transfer techniques. D/c plan remains appropriate.   Follow Up Recommendations  Home health OT;Supervision/Assistance - 24 hour;Supervision - Intermittent    Equipment Recommendations  3 in 1 bedside commode    Recommendations for Other Services      Precautions / Restrictions Precautions Precautions: Fall Precaution Comments: LLE weakness, ocassionally drags L foot/toes Restrictions Weight Bearing Restrictions: No       Mobility Bed Mobility Overal bed mobility: Modified Independent             General bed mobility comments: up in chair  Transfers Overall transfer level: Needs assistance Equipment used: Rolling walker (2 wheeled) Transfers: Sit to/from Stand Sit to Stand: Min guard;Min assist Stand pivot transfers: Min guard       General transfer comment: Min A on initial stand from recliner to steady, seeking external support. Remainder of transfers this session were min guard with use of rw.    Balance Overall balance assessment: Needs assistance Sitting-balance support: No upper extremity supported;Feet supported Sitting balance-Leahy Scale: Fair     Standing balance support: Bilateral upper extremity supported;During functional activity Standing balance-Leahy Scale: Poor Standing balance comment: BUE for  static standing                           ADL either performed or assessed with clinical judgement   ADL Overall ADL's : Needs assistance/impaired                         Toilet Transfer: Min guard;Minimal assistance;Ambulation;RW Toilet Transfer Details (indicate cue type and reason): min A on initial stand from recliner to steady, toilet transfer to 3n1 over toilet with rw was min guard. Toileting- Architect and Hygiene: Min guard;Minimal assistance;Sit to/from stand Toileting - Clothing Manipulation Details (indicate cue type and reason): assist for clothing management     Functional mobility during ADLs: Min guard;Rolling walker General ADL Comments: Pt completed in-room functional mobility, toilet transfer, pericare. Discussed safety and techniques for shower transfer.      Vision       Perception     Praxis      Cognition Arousal/Alertness: Awake/alert Behavior During Therapy: Flat affect;WFL for tasks assessed/performed Overall Cognitive Status: Within Functional Limits for tasks assessed(WFL's for tasks assessed however pt reports h/o of difficult)                                 General Comments: slow responses, minimal verbalizations, flat affect.        Exercises General Exercises - Lower Extremity Long Arc Quad: AROM;Both;Seated(2x10)   Shoulder Instructions       General Comments      Pertinent Vitals/ Pain       Pain Assessment: No/denies pain  Home Living  Prior Functioning/Environment              Frequency  Min 3X/week        Progress Toward Goals  OT Goals(current goals can now be found in the care plan section)  Progress towards OT goals: Progressing toward goals  Acute Rehab OT Goals Patient Stated Goal: "I want to go home" OT Goal Formulation: With patient Time For Goal Achievement: 03/17/18 Potential to Achieve Goals:  Good ADL Goals Pt Will Perform Grooming: with set-up;sitting;standing Pt Will Perform Lower Body Bathing: with supervision;sit to/from stand Pt Will Perform Lower Body Dressing: with set-up;sitting/lateral leans;sit to/from stand;with modified independence Pt Will Transfer to Toilet: with modified independence;ambulating;bedside commode;grab bars Pt Will Perform Toileting - Clothing Manipulation and hygiene: with modified independence;sitting/lateral leans;sit to/from stand Pt Will Perform Tub/Shower Transfer: Shower transfer;with supervision;grab bars;rolling walker;ambulating;shower seat  Plan Discharge plan remains appropriate    Co-evaluation                 AM-PAC PT "6 Clicks" Daily Activity     Outcome Measure   Help from another person eating meals?: None Help from another person taking care of personal grooming?: A Little Help from another person toileting, which includes using toliet, bedpan, or urinal?: A Little Help from another person bathing (including washing, rinsing, drying)?: A Lot Help from another person to put on and taking off regular upper body clothing?: A Little Help from another person to put on and taking off regular lower body clothing?: A Little 6 Click Score: 18    End of Session Equipment Utilized During Treatment: Rolling walker  OT Visit Diagnosis: Unsteadiness on feet (R26.81);Muscle weakness (generalized) (M62.81);Other symptoms and signs involving the nervous system (R29.898)   Activity Tolerance Patient tolerated treatment well   Patient Left in chair;with call bell/phone within reach   Nurse Communication          Time: [id number] OT Time Calculation (min): 16 min  Charges: OT General Charges $OT Visit: 1 Visit OT Treatments $Self Care/Home Management : 8-22 mins  Raynald KempKathryn Winna Golla, OT Acute Rehabilitation Services Pager: [phone] Office: [phone]   Pilar GrammesMathews, Danyelle Brookover H 03/04/2018, 11:32 AM

## 2018-03-04 NOTE — Consult Note (Signed)
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 Per chart review patient lives with spouse. One level home with 4 steps to entry. 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 [facility] [id number] [facility] with increasing left side weakness,headache, numbness and blurred vision on the left. There was also possible witnessed focal seizure activity by family. MRI and imaging at outside hospital showed watershed CVA right posterior and ACA territory.CTA revealed multifocal intracranial atherosclerotic disease.Did not receive TPA. [facility]. Neurology follow-up currently on aspirin and Plavix for CVA prophylaxis. Subcutaneous Lovenox for DVT prophylaxis.EEG negative for seizure. Tolerating a regular diet. Attempts at TEE however patient became very agitated and combative with procedure aborted. Await plan to reschedule with general anesthesia.Therapy evaluation completed with recommendations of physical medicine rehabilitation consult.   Review of Systems  Constitutional: Negative for chills and fever.  HENT: Negative for hearing loss.   Eyes: Positive for blurred vision. Negative for double vision.  Respiratory: Negative for cough and shortness of breath.   Cardiovascular: Positive for leg swelling. Negative for chest pain.  Gastrointestinal: Positive for constipation. Negative for nausea and vomiting.  Genitourinary: Negative for dysuria, flank pain and hematuria.  Musculoskeletal: Positive for back pain and joint pain.  Skin: Negative for rash.  Neurological: Positive for tingling, focal weakness and  headaches.  Psychiatric/Behavioral: The patient has insomnia.   All other systems reviewed and are negative.  Past Medical History:  Diagnosis Date  . Chronic kidney disease   . Diabetes mellitus without complication (HCC)   . Fibromyalgia   . Headache   . History of kidney stones   . Hyperlipemia   . Hypertension   . Insomnia   . Pancreatitis    chronic  . Sleep apnea   . Stroke (cerebrum) [facility](HCC)    Past Surgical History:  Procedure Laterality Date  . ABDOMINAL HYSTERECTOMY    . CHOLECYSTECTOMY    . ROTATOR CUFF REPAIR Right   . SHOULDER SURGERY Left    Family History  Problem Relation Age of Onset  . Diabetes Neg Hx    Social History:  reports that she has never smoked. She has never used smokeless tobacco. She reports that she does not drink alcohol or use drugs. Allergies: No Known Allergies Medications Prior to Admission  Medication Sig Dispense Refill  . amLODipine (NORVASC) 10 MG tablet Take 10 mg by mouth daily.     Marland Kitchen. atorvastatin (LIPITOR) 20 MG tablet Take 20 mg by mouth daily.    . carvedilol (COREG) 25 MG tablet Take 25 mg by mouth 2 (two) times daily with a meal.     . insulin regular human CONCENTRATED (HUMULIN R U-500 KWIKPEN) 500 UNIT/ML kwikpen Inject 50 Units into the skin 3 (three) times daily with meals.     Marland Kitchen. QUEtiapine (SEROQUEL) 50 MG tablet Take 50 mg by mouth at bedtime.    . traMADol (ULTRAM) 50 MG tablet Take 50 mg by mouth every 6 (six) hours as needed for moderate pain.     Marland Kitchen. acetaminophen (TYLENOL) 500 MG tablet Take 1,000 mg by mouth every 6 (six) hours as needed for mild pain or headache.    . chlorthalidone (HYGROTON) 25  MG tablet Take 25 mg by mouth daily.    . DULoxetine (CYMBALTA) 60 MG capsule Take 60 mg by mouth daily.    . hydrALAZINE (APRESOLINE) 50 MG tablet Take 50 mg by mouth 3 (three) times daily.    Marland Kitchen lisinopril (PRINIVIL,ZESTRIL) 40 MG tablet Take 40 mg by mouth daily.      Home: Home Living Family/patient expects to be  discharged to:: Private residence Living Arrangements: Spouse/significant other, Children Available Help at Discharge: Family, Available PRN/intermittently Type of Home: House Home Access: Stairs to enter [employer] of Steps: 4 STE  Entrance Stairs-Rails: Left Home Layout: One level Bathroom Shower/Tub: Psychologist, counselling, Engineer, building services: Standard Home Equipment: Environmental consultant - 2 wheels, Grab bars - toilet, Grab bars - tub/shower  Functional History: Prior Function Level of Independence: Independent Comments: worked in Sports administrator Status:  Mobility: Bed Mobility Overal bed mobility: Modified Independent General bed mobility comments: Increased time Transfers Overall transfer level: Needs assistance Equipment used: None Transfers: Sit to/from Stand Sit to Stand: Min assist Stand pivot transfers: Min assist General transfer comment: assist for balance to stand from EOB, attempted HHA, but still unsteady so used walker Ambulation/Gait Ambulation/Gait assistance: Mod assist Gait Distance (Feet): 20 Feet Assistive device: Rolling walker (2 wheeled) Gait Pattern/deviations: Step-to pattern, Decreased step length - left, Decreased stance time - left, Trunk flexed, Shuffle, Decreased dorsiflexion - left General Gait Details: dragging L foot and at times unable to progress without assist for R weight shift and cues.  Assist for walker to turn in room and cues for positioning.     ADL: ADL Overall ADL's : Needs assistance/impaired Eating/Feeding: NPO Eating/Feeding Details (indicate cue type and reason): Awaiting testing Grooming: Wash/dry hands, Standing, Minimal assistance Grooming Details (indicate cue type and reason): VC's for safety and sequencing with RW during functional activity Upper Body Bathing: Sitting, Min guard Lower Body Bathing: Minimal assistance, Sit to/from stand Upper Body Dressing : Set up, Sitting Lower Body Dressing: Min guard, Minimal  assistance, Sit to/from stand Toilet Transfer: Minimal assistance, RW, BSC, Ambulation, Cueing for sequencing, Cueing for safety, Stand-pivot Toilet Transfer Details (indicate cue type and reason): 3:1 over toilet, assist secondary to LLE weakness Toileting- Clothing Manipulation and Hygiene: Minimal assistance, Sit to/from stand Functional mobility during ADLs: Minimal assistance, Cueing for safety, Cueing for sequencing, Rolling walker General ADL Comments: Pt was educated in OT following assessment today and then participated in ADL retraining session with focus on toileting, grooming and functional mobility/transfers from bed/toilet/recliner chair. Pt is with noted deficits and generalized weakness LUE and LLE and should benefit from acute OT followed by HHOT and Min A PRN. PTA pt was independent and did not use RW or other AD.  Cognition: Cognition Overall Cognitive Status: Within Functional Limits for tasks assessed Orientation Level: Oriented X4 Cognition Arousal/Alertness: Awake/alert Behavior During Therapy: WFL for tasks assessed/performed Overall Cognitive Status: Within Functional Limits for tasks assessed General Comments: slow to respond and minimal verbalizations with flat affect (s/p medications for TEE making her sleepy per pt)  Blood pressure (!) 164/94, pulse 81, temperature 98 F (36.7 C), temperature source Oral, resp. rate 16, height 5\' 5"  (1.651 m), weight 106.3 kg, SpO2 97 %. Physical Exam  Constitutional: No distress.  HENT:  Head: Normocephalic and atraumatic.  Eyes: Pupils are equal, round, and reactive to light. EOM are normal.  Neck: Normal range of motion.  Cardiovascular: Normal rate.  Respiratory: Effort normal.  GI: Soft.  Neurological:  Patient is alert and  resting comfortably. Tracks to all fields. Mild left central 7. Normal insight and awareness. Mild left inattention. RUE 5/5. LUE 4/5 prox to distal. RLE 5/5. LLE 4/5. Sl decrease in LT/pain LUE and  LLE.   Skin: Skin is warm. She is not diaphoretic.  Psychiatric:  flat    Results for orders placed or performed during the hospital encounter of 03/02/18 (from the past 24 hour(s))  Glucose, capillary     Status: Abnormal   Collection Time: 03/03/18  1:43 PM  Result Value Ref Range   Glucose-Capillary 244 (H) 70 - 99 mg/dL  Glucose, capillary     Status: Abnormal   Collection Time: 03/03/18  4:49 PM  Result Value Ref Range   Glucose-Capillary 181 (H) 70 - 99 mg/dL  Glucose, capillary     Status: Abnormal   Collection Time: 03/03/18  8:49 PM  Result Value Ref Range   Glucose-Capillary 173 (H) 70 - 99 mg/dL   Comment 1 Notify RN    Comment 2 Document in Chart   Glucose, capillary     Status: Abnormal   Collection Time: 03/03/18 11:57 PM  Result Value Ref Range   Glucose-Capillary 160 (H) 70 - 99 mg/dL   Comment 1 Notify RN    Comment 2 Document in Chart   Glucose, capillary     Status: Abnormal   Collection Time: 03/04/18  3:46 AM  Result Value Ref Range   Glucose-Capillary 179 (H) 70 - 99 mg/dL   Comment 1 Notify RN    Comment 2 Document in Chart   Glucose, capillary     Status: Abnormal   Collection Time: 03/04/18  8:10 AM  Result Value Ref Range   Glucose-Capillary 166 (H) 70 - 99 mg/dL   No results found.   Assessment/Plan: Diagnosis: watershed right PCA/ACA infarct 1. Does the need for close, 24 hr/day medical supervision in concert with the patient's rehab needs make it unreasonable for this patient to be served in a less intensive setting? Yes 2. Co-Morbidities requiring supervision/potential complications: DM2, htn, obesity 3. Due to bladder management, bowel management, safety, skin/wound care, disease management, medication administration, pain management and patient education, does the patient require 24 hr/day rehab nursing? Yes 4. Does the patient require coordinated care of a physician, rehab nurse, PT (1-2 hrs/day, 5 days/week) and OT (1-2 hrs/day, 5  days/week) to address physical and functional deficits in the context of the above medical diagnosis(es)? Potentially Addressing deficits in the following areas: balance, endurance, locomotion, strength, transferring, bowel/bladder control, bathing, dressing, feeding, grooming, toileting and psychosocial support 5. Can the patient actively participate in an intensive therapy program of at least 3 hrs of therapy per day at least 5 days per week? Potentially 6. The potential for patient to make measurable gains while on inpatient rehab is fair 7. Anticipated functional outcomes upon discharge from inpatient rehab are n/a  with PT, n/a with OT, n/a with SLP. 8. Estimated rehab length of stay to reach the above functional goals is: n/a 9. Anticipated D/C setting: Home 10. Anticipated post D/C treatments: HH therapy 11. Overall Rehab/Functional Prognosis: good  RECOMMENDATIONS: This patient's condition is appropriate for continued rehabilitative care in the following setting: see below Patient has agreed to participate in recommended program. Yes Note that insurance prior authorization may be required for reimbursement for recommended care.  Comment: Pt states she was up with therapy this morning and did well. See no documentation yet. She is two days out from stroke. Deficits relatively  mild on exam. Patient states that she is planning on going home and feels that she will do "ok". Will follow along for functional progress/notes from therapy, and I will assume that she's going home with HH/outpt therapies at this point.    I have personally performed a face to face diagnostic evaluation of this patient and formulated the key components of the plan.  Additionally, I have personally reviewed laboratory data, imaging studies, as well as relevant notes and concur with the physician assistant's documentation above.  Ranelle Oyster, MD, [place] [facility]   Ranelle Oyster, MD 03/04/2018

## 2018-03-04 NOTE — Care Management Note (Signed)
Case Management Note  Patient Details  Name: [name] MRN: [id number] Date of Birth: 03-Jan-1966  Subjective/Objective:                    Action/Plan: [facility]. CM provided choice and [facility] selected. [facility]up[facility]na [facility] notified and accepted the [facility].  Pt has walker at home.   I have discussed the patient's current level of function related to stroke with the patient and spouse.  They acknowledge understanding of this and feel they can provide the level of care the patient will need at home.    Pt provided assistance coupons for her d/c insulins. Spouse to provide transport home.    Expected Discharge Date:  03/04/18               Expected Discharge Plan:  Home w Home Health Services  In-House Referral:     Discharge planning Services  CM Consult  Post Acute Care Choice:  Home Health Choice offered to:  Patient  DME Arranged:    DME Agency:     HH Arranged:  OT, PT, RN, Nurse's Aide HH Agency:  [facility]  Status of Service:  Completed, signed off  If discussed at Long Length of Stay Meetings, dates discussed:    Additional Comments:  Kermit BaloKelli F Tessi Eustache, RN 03/04/2018, 2:37 PM

## 2018-03-04 NOTE — Discharge Summary (Signed)
Physician Discharge Summary  [name] ZOX:[id number] DOB: 1965/05/18 DOA: 03/02/2018  PCP: Abigail Miyamoto, MD  Admit date: 03/02/2018 Discharge date: 03/04/2018  Admitted From: home Disposition:  home   Recommendations for Outpatient Follow-up:  1. F/u to ensure [name] is taking medications appropriately 2. [name] recommended SNF but patient has declined this- I have discussed this with [name] husband  Home Health:  ordered   Discharge Condition:  stable   CODE STATUS:  Full code   Diet recommendation:  Diabetic, heart healthy Consultations:  Neuro     Discharge Diagnoses:  Principal Problem:   Acute CVA (cerebrovascular accident) (HCC) Active Problems:   Diabetes (HCC)   Hypothyroidism   HTN (hypertension)   RLS (restless legs syndrome)   Obesity, morbid, BMI 40.0-49.9 (HCC)   Dyslipidemia     Hospital Course:  [name] is a 27 y/ with DM, HTN, HLD, Hypothyroidism, CVA who is non compliant and presents with left sided weakness, numbness and blurred vision. Apparently symptoms started on Saturday night but when [name] woke up on Sunday morning [name] was feeling better so decided not to seek for help. However, [name] has had multiple falls (5 times). [name] had previous CVA with left-sided weakness. Now the weakness is worse than baseline.  There was also possible witnessed focal seizure activity by family. Patient was seen and evaluated at [facility] with head CT CT angiogram as well as MRI of the brain. [name] was found to have a watershed CVA on the right posterior and ACA territory. There was recommendations for TEE. The Hospitalist spoke with Dr. [place] of cardiology who accepted to do TEE was patient arrives here. [name] has been started on Keppra prior to arrival based on the recommendation.  Further information obtained from patient and husband: [name] states [name] stopped all [name] medications other than [name] Insulin because [name] thought [name] was on too much medications and  is trying to find a new PCP. [name] is not sure how much insulin [name] takes although [name] draws it up herself. [name] husband is not aware that there has been a problem. He does do all of the shopping and does not bring junk food in to the house.  Principal Problem:   Acute CVA (cerebrovascular accident)- R ACA infarct secondary to large vessel disease   - stroke team assisting with management - likely due to uncontrolled co morbidities - on high dose statin - see below in regards to dyslipidemia - HgbA1c- 13.7  - TEE not able to be completed due to restlessness- per neurology eval, Dr Pearlean Brownie does not feel [name] needs it and thus it will not be done. - further neuro recommendations> aspirin 81 mg and clopidogrel 75 mg orally every day x 3 months for secondary stroke prevention. After 3 months, change to plavix alone.  Active Problems:   Diabetes Mellitus type 2, uncontrolled - HgbA1c- 13.7  - started Lantus and cont SSI- hold U 500 - sugars quite stable on currents doses- see below - have discussed with patient and husband the need to change [name] regimen- I feel [name] is very unreliable in regards to taking insulin with meals ([name] is alone at home in the daytime) and I have told [name] husband to give [name] the Lantus or watch [name] take in in his presence- I have recommended a log to ensure [name] is actually taking the insulin- I have also ordered [facility] to f/u to make sure is taking [name] meds - dietician consulted  Nausea -  Zofran PRN- slow IVF- has improved  CKD3 - follow     HTN (hypertension) - held Coreg, Norvasc, Hydralazine, Lisinopril, Chlorthalidone to allow permissive HTN - have resumed Coreg today - can resume rest tomorrow   Obesity, morbid, BMI 40.0-49.9   Body mass index is 39 kg/m.   Depression/anxiety -cont Cymbalta-    Seizure disorder - Keppra  Non- complaince - dicussed importance of staying compliant with medications on multiple occasions   Discharge Exam: Vitals:    03/04/18 0348 03/04/18 0800  BP: (!) 158/85 (!) 164/94  Pulse: 74 81  Resp: 16   Temp: 98.4 F (36.9 C) 98 F (36.7 C)  SpO2: 98% 97%   Vitals:   03/03/18 2019 03/04/18 0000 03/04/18 0348 03/04/18 0800  BP: (!) 152/89 (!) 167/85 (!) 158/85 (!) 164/94  Pulse:  79 74 81  Resp:  18 16   Temp: 98.2 F (36.8 C) 97.7 F (36.5 C) 98.4 F (36.9 C) 98 F (36.7 C)  TempSrc: Oral Oral Oral Oral  SpO2: 98% 96% 98% 97%  Weight:      Height:        General: [name] is alert, awake, not in acute distress Cardiovascular: RRR, S1/S2 +, no rubs, no gallops Respiratory: CTA bilaterally, no wheezing, no rhonchi Abdominal: Soft, NT, ND, bowel sounds + Extremities: no edema, no cyanosis Psych: flat affect   Discharge Instructions  Discharge Instructions    Diet - low sodium heart healthy   Complete by:  As directed    Diet Carb Modified   Complete by:  As directed    Increase activity slowly   Complete by:  As directed      Allergies as of 03/04/2018   No Known Allergies     Medication List    STOP taking these medications   HUMULIN R U-500 KWIKPEN 500 UNIT/ML kwikpen Generic drug:  insulin regular human CONCENTRATED   traMADol 50 MG tablet Commonly known as:  ULTRAM     TAKE these medications   acetaminophen 500 MG tablet Commonly known as:  TYLENOL Take 1,000 mg by mouth every 6 (six) hours as needed for mild pain or headache.   amLODipine 10 MG tablet Commonly known as:  NORVASC Take 10 mg by mouth daily.   aspirin 81 MG EC tablet Take 1 tablet (81 mg total) by mouth daily.   atorvastatin 80 MG tablet Commonly known as:  LIPITOR Take 1 tablet (80 mg total) by mouth at bedtime. What changed:    medication strength  how much to take  when to take this   carvedilol 25 MG tablet Commonly known as:  COREG Take 25 mg by mouth 2 (two) times daily with a meal.   chlorthalidone 25 MG tablet Commonly known as:  HYGROTON Take 25 mg by mouth daily.   clopidogrel  75 MG tablet Commonly known as:  PLAVIX Take 1 tablet (75 mg total) by mouth daily.   DULoxetine 60 MG capsule Commonly known as:  CYMBALTA Take 60 mg by mouth daily.   hydrALAZINE 50 MG tablet Commonly known as:  APRESOLINE Take 50 mg by mouth 3 (three) times daily.   insulin aspart 100 UNIT/ML injection Commonly known as:  novoLOG Inject 5 Units into the skin 3 (three) times daily with meals.   insulin aspart 100 UNIT/ML FlexPen Commonly known as:  NOVOLOG Inject 1-15 Units into the skin 3 (three) times daily with meals. CBG 121 - 150: 2 units  CBG 151 -  200: 3 units  CBG 201 - 250: 5 units  CBG 251 - 300: 8 units  CBG 301 - 350: 11 units  CBG 351 - 400: 15 units   Insulin Glargine 100 UNIT/ML Solostar Pen Commonly known as:  LANTUS Inject 30 Units into the skin at bedtime. Start taking on:  03/05/2018   Insulin Pen Needle 31G X 4 MM Misc 1 each by Does not apply route 4 (four) times daily -  before meals and at bedtime.   levETIRAcetam 500 MG tablet Commonly known as:  KEPPRA Take 1 tablet (500 mg total) by mouth 2 (two) times daily.   lisinopril 40 MG tablet Commonly known as:  PRINIVIL,ZESTRIL Take 40 mg by mouth daily.   QUEtiapine 50 MG tablet Commonly known as:  SEROQUEL Take 50 mg by mouth at bedtime.   senna-docusate 8.6-50 MG tablet Commonly known as:  Senokot-S Take 1 tablet by mouth at bedtime as needed for moderate constipation.            Durable Medical Equipment  (From admission, onward)         Start     Ordered   03/03/18 1750  For home use only DME Walker rolling  Once    Question:  Patient needs a walker to treat with the following condition  Answer:  CVA (cerebral vascular accident) [facility])   03/03/18 1750         Follow-up Information    Abigail Miyamoto, MD Follow up in 1 week(s).   Specialty:  Family Medicine Contact information: [address] [place] [address] [phone]        Micki Riley, MD  Follow up.   Specialties:  Neurology, Radiology Contact information: [address] [place] [address] [phone]        [facility] - Dme Follow up.   Why:  They will contact you for the first visit. Contact information: [address] [place] [address] [phone]          No Known Allergies   Procedures/Studies:    No results found.   The results of significant diagnostics from this hospitalization (including imaging, microbiology, ancillary and laboratory) are listed below for reference.     Microbiology: No results found for this or any previous visit (from the past 240 hour(s)).   Labs: BNP (last 3 results) No results for input(s): BNP in the last 8760 hours. Basic Metabolic Panel: Recent Labs  Lab 03/02/18 2149 03/03/18 0934  NA  --  135  K  --  3.7  CL  --  104  CO2  --  24  GLUCOSE  --  301*  BUN  --  19  CREATININE 1.57* 1.48*  CALCIUM  --  8.5*   Liver Function Tests: Recent Labs  Lab 03/03/18 0934  AST 36  ALT 35  ALKPHOS 184*  BILITOT 0.6  PROT 5.8*  ALBUMIN 2.4*   No results for input(s): LIPASE, AMYLASE in the last 168 hours. No results for input(s): AMMONIA in the last 168 hours. CBC: Recent Labs  Lab 03/02/18 2149  WBC 8.7  HGB 13.4  HCT 41.0  MCV 85.6  PLT 329   Cardiac Enzymes: No results for input(s): CKTOTAL, CKMB, CKMBINDEX, TROPONINI in the last 168 hours. BNP: Invalid input(s): POCBNP CBG: Recent Labs  Lab 03/03/18 2049 03/03/18 2357 03/04/18 0346 03/04/18 0810 03/04/18 1210  GLUCAP 173* 160* 179* 166* 182*  D-Dimer No results for input(s): DDIMER in the last 72 hours. Hgb A1c Recent Labs    03/03/18 0439  HGBA1C 13.7*   Lipid Profile Recent Labs    03/03/18 0439  CHOL 460*  HDL 40*  LDLCALC UNABLE TO CALCULATE IF TRIGLYCERIDE OVER 400 mg/dL  TRIG 1,610*  CHOLHDL 96.0   Thyroid function studies No results for input(s): TSH, T4TOTAL, T3FREE,  THYROIDAB in the last 72 hours.  Invalid input(s): FREET3 Anemia work up No results for input(s): VITAMINB12, FOLATE, FERRITIN, TIBC, IRON, RETICCTPCT in the last 72 hours. Urinalysis No results found for: COLORURINE, APPEARANCEUR, LABSPEC, PHURINE, GLUCOSEU, HGBUR, BILIRUBINUR, KETONESUR, PROTEINUR, UROBILINOGEN, NITRITE, LEUKOCYTESUR Sepsis Labs Invalid input(s): PROCALCITONIN,  WBC,  LACTICIDVEN Microbiology No results found for this or any previous visit (from the past 240 hour(s)).   Time coordinating discharge in minutes: 65  SIGNED:   Calvert Cantor, MD  [facility] 03/04/2018, 12:45 PM Pager   If 7PM-7AM, please contact night-coverage [email or web address] Password [email or web address]

## 2018-03-04 NOTE — Progress Notes (Signed)
STROKE TEAM PROGRESS NOTE     INTERVAL HISTORY Her husband is at the bedside.  She is is awake and alert and states her left-sided weakness is improving. She is refusing inpatient rehabilitation and wants to go home. She does have history of sleep apnea and states she takes her CPAP regularly  Vitals:   03/03/18 2019 03/04/18 0000 03/04/18 0348 03/04/18 0800  BP: (!) 152/89 (!) 167/85 (!) 158/85 (!) 164/94  Pulse:  79 74 81  Resp:  18 16   Temp: 98.2 F (36.8 C) 97.7 F (36.5 C) 98.4 F (36.9 C) 98 F (36.7 C)  TempSrc: Oral Oral Oral Oral  SpO2: 98% 96% 98% 97%  Weight:      Height:        CBC:  Recent Labs  Lab 03/02/18 2149  WBC 8.7  HGB 13.4  HCT 41.0  MCV 85.6  PLT 329    Basic Metabolic Panel:  Recent Labs  Lab 03/02/18 2149 03/03/18 0934  NA  --  135  K  --  3.7  CL  --  104  CO2  --  24  GLUCOSE  --  301*  BUN  --  19  CREATININE 1.57* 1.48*  CALCIUM  --  8.5*   Lipid Panel:     Component Value Date/Time   CHOL 460 (H) 03/03/2018 0439   TRIG 1,031 (H) 03/03/2018 0439   HDL 40 (L) 03/03/2018 0439   CHOLHDL 11.5 03/03/2018 0439   VLDL UNABLE TO CALCULATE IF TRIGLYCERIDE OVER 400 mg/dL 16/01/9603 5409   LDLCALC UNABLE TO CALCULATE IF TRIGLYCERIDE OVER 400 mg/dL 81/19/1478 2956   OZHY8M:  Lab Results  Component Value Date   HGBA1C 13.7 (H) 03/03/2018   Urine Drug Screen: No results found for: LABOPIA, COCAINSCRNUR, LABBENZ, AMPHETMU, THCU, LABBARB  Alcohol Level No results found for: ETH  IMAGING No results found.  EEG Normal electroencephalogram, awake and drowsy. There are no focal lateralizing or epileptiform features.  TEE pending   PHYSICAL EXAM Obese middle aged Caucasian lady not in distress. . Afebrile. Head is nontraumatic. Neck is supple without bruit.    Cardiac exam no murmur or gallop. Lungs are clear to auscultation. Distal pulses are well felt. Neurology Exam :  Awake alert oriented x 3 normal speech and language. Mild  left lower face asymmetry. Tongue midline. Mild LUE and LLE weakness 4+/5. Mild diminished fine finger movements on left. Orbits right over left upper extremity. Mild left grip weak.. Normal sensation . Normal coordination.  ASSESSMENT/PLAN [name] is a 52 y.o. female with history of previous stroke non complaint with meds, HTN, HLD, DB, hypothyroidism, obesity presenting to [facility] with left-sided weakness that started on 02/28/2018. Transferred to [facility] for TEE. 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 Given large vessel intracranial atherosclerosis, patient should be treated with aspirin 81 mg and clopidogrel 75 mg orally every day x 3 months for secondary stroke prevention. After 3 months, change to plavix alone. Long-term dual antiplatelets are contraindicated due to risk for intracerebral hemorrhage.   Therapy  recommendations:  HH OT, PT CIR - consult in place  Disposition:  pending   Seizure  EEG - no seizure  Hypertensive Urgency  B{ as jogj as 197/101 . Permissive hypertension (OK if < 220/120) but gradually normalize in 5-7 days . Long-term BP goal normotensive  Hyperlipidemia  Home meds:  lipitor 20  Now on lipitor 80 in hospital  LDL UTC, goal < 70  Continue statin at discharge  Diabetes type II  HgbA1c 13.7, goal < 7.0  Uncontrolled  Diabetes coordinator following  Other Stroke Risk Factors  Obesity, Body mass index is 39 kg/m., recommend weight loss, diet and exercise as appropriate   Hx stroke/TIA  Obstructive sleep apnea  Other Active Problems  ESRD    Hypothyroidism   restless leg syndrome   Hospital day # 2     She has presented with left hemiparesis due to right ACA infarct likely due to intracranial atherosclerosis. Recommend dual] therapy of aspirin and Plavix for 3 months with aggressive risk factor modification.  No need to try to repeat TEE as I believe her infarct is due to large vessel intracranial atherosclerosis. Recommend home health physical therapy. Patient counseled to be compliant with the medications, and aggressive risk factor modification and to use a CPAP. She will likely need current outpatient therapy. Discussed with patient, her husband and Dr.Rizwan .Greater than 50% time during this 25 minute visit was spent on counseling and coordination of care about her stroke and hemiplegia and answering questions.stroke team will sign off. Kindly call for questions.follow-up as an outpatient in stroke clinic in 6 weeks.  Delia HeadyPramod Sethi, MD Medical Director [facility] Pager: [phone] 03/04/2018 1:50 PM  To contact Stroke Continuity provider, please refer to [email or web address]. After hours, contact General Neurology

## 2018-03-04 NOTE — Progress Notes (Signed)
Pt being discharged from hospital per orders from MD. Pt educated on discharge instructions. Pt verbalized understanding of instructions. All questions and concerns were addressed. Pt's IV was removed prior to discharge. Pt exited hospital via wheelchair accompanied by staff. 

## 2018-03-04 NOTE — Progress Notes (Addendum)
Tx performed and charted by SPTA under direct guidance and supervision of PTA at all times. This session was performed under the supervision of a licensed clinician.   Charting reviewed for accuracy and depicts tx performed and services provided.  Joycelyn RuaAimee Paola Flynt, PTA Acute Rehabilitation Services Pager [phone] Office [phone]  Physical Therapy Treatment Patient Details Name: [name] Bettinger MRN: [id number] DOB: May 17, 1965 Today's Date: 03/04/2018    History of Present Illness [name] Beaupre is a 52 y.o. female with a history of left-sided weakness that started on Monday.  MRI revealed Right side of ACA/MCA CVA. 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     PT Comments    Patient's tolerance to treatment today was fair.  Patient was in bed with no visitors present upon PT arrival.  Patient required increased time with all activities.  Patient required min assist with ambulation to maintain path of RW with noted drifting to the left.  Patient needed VC to remain within RW.  Patient required min VC to look forward.  Patient required increased time for concentration with ambulation.  Patient was able to participate with LE strengthening exercises with noted fatigue at end of session.  Therapist recommended SNF placement for continued improvement with rehab outcomes; however, pt refused SNF placement and stated preference to return home. I have discussed the patient's current level of function related to strength and mobility deficits with the patient. They acknowledge understanding of this and feel they can provide the level of care they will need at home.       Follow Up Recommendations  SNF(pt refusing SNF placement; will require HHPT)     Equipment Recommendations  None recommended by PT(pt reports she has a RW at home  and grab bars in her bathroom.)    Recommendations for Other Services Rehab consult     Precautions / Restrictions Precautions Precautions: Fall Precaution Comments: LLE weakness, ocassionally drags L foot/toes Restrictions Weight Bearing Restrictions: No    Mobility  Bed Mobility Overal bed mobility: Modified Independent             General bed mobility comments: up in chair  Transfers Overall transfer level: Needs assistance Equipment used: Rolling walker (2 wheeled) Transfers: Sit to/from Stand Sit to Stand: Min guard;Min assist Stand pivot transfers: Min guard       General transfer comment: Min A on initial stand from recliner to steady, seeking external support. Remainder of transfers this session were min guard with use of rw.  Ambulation/Gait Ambulation/Gait assistance: Min assist Gait Distance (Feet): 80 Feet Assistive device: Rolling walker (2 wheeled) Gait Pattern/deviations: Step-through pattern;Drifts right/left Gait velocity: decreased   General Gait Details: Pt demonstrated step through pattern but was slow with processing and advancing L LE during ambulation.  Ambulation required considerable amount of concentration from the patient.  Patient required min assist and VC to maintain path of RW with noted drifting to the left.  Patient also required VC to remain within the RW during ambulation.   Stairs             Wheelchair Mobility    Modified Rankin (Stroke Patients Only) Modified Rankin (Stroke Patients Only) Pre-Morbid Rankin Score: No symptoms Modified Rankin: Moderately severe disability     Balance Overall balance assessment: Needs assistance Sitting-balance support: No upper extremity supported;Feet supported Sitting balance-Leahy Scale: Fair  Standing balance support: Bilateral upper extremity supported;During functional activity Standing balance-Leahy Scale: Poor Standing balance comment: BUE for static standing                             Cognition Arousal/Alertness: Awake/alert Behavior During Therapy: Flat affect;WFL for tasks assessed/performed Overall Cognitive Status: Within Functional Limits for tasks assessed(WFL's for tasks assessed however pt reports h/o of difficult)                                 General Comments: slow responses, minimal verbalizations, flat affect.      Exercises General Exercises - Lower Extremity Long Arc Quad: AROM;Both;Seated(2x10)    General Comments        Pertinent Vitals/Pain Pain Assessment: No/denies pain    Home Living                      Prior Function            PT Goals (current goals can now be found in the care plan section) Acute Rehab PT Goals Patient Stated Goal: "I want to go home" PT Goal Formulation: With patient Time For Goal Achievement: 03/17/18 Potential to Achieve Goals: Good Progress towards PT goals: Progressing toward goals    Frequency    Min 4X/week      PT Plan Discharge plan needs to be updated    Co-evaluation              AM-PAC PT "6 Clicks" Daily Activity  Outcome Measure  Difficulty turning over in bed (including adjusting bedclothes, sheets and blankets)?: A Little Difficulty moving from lying on back to sitting on the side of the bed? : A Little Difficulty sitting down on and standing up from a chair with arms (e.g., wheelchair, bedside commode, etc,.)?: Unable Help needed moving to and from a bed to chair (including a wheelchair)?: A Little Help needed walking in hospital room?: A Little Help needed climbing 3-5 steps with a railing? : A Little 6 Click Score: 16    End of Session Equipment Utilized During Treatment: Gait belt Activity Tolerance: Patient tolerated treatment well;Patient limited by fatigue Patient left: in chair;with call bell/phone within reach;with chair alarm set Nurse Communication: Mobility status PT Visit Diagnosis: Other abnormalities  of gait and mobility (R26.89);Hemiplegia and hemiparesis Hemiplegia - Right/Left: Left Hemiplegia - dominant/non-dominant: Non-dominant Hemiplegia - caused by: Cerebral infarction     Time: 0927-0957 PT Time Calculation (min) (ACUTE ONLY): 30 min  Charges:  $Gait Training: 8-22 mins $Therapeutic Exercise: 8-22 mins                     [address] [place] 03/04/2018, 12:58 PM

## 2018-03-07 ENCOUNTER — Telehealth: Payer: Self-pay | Admitting: Neurology

## 2018-03-07 DIAGNOSIS — F418 Other specified anxiety disorders: Secondary | ICD-10-CM

## 2018-03-07 DIAGNOSIS — E039 Hypothyroidism, unspecified: Secondary | ICD-10-CM

## 2018-03-07 DIAGNOSIS — I5032 Chronic diastolic (congestive) heart failure: Secondary | ICD-10-CM

## 2018-03-07 DIAGNOSIS — I10 Essential (primary) hypertension: Secondary | ICD-10-CM

## 2018-03-07 DIAGNOSIS — I639 Cerebral infarction, unspecified: Secondary | ICD-10-CM | POA: Diagnosis not present

## 2018-03-07 DIAGNOSIS — E119 Type 2 diabetes mellitus without complications: Secondary | ICD-10-CM

## 2018-03-07 DIAGNOSIS — N189 Chronic kidney disease, unspecified: Secondary | ICD-10-CM

## 2018-03-07 NOTE — Telephone Encounter (Signed)
RN spoke with Rosanne AshingJim at [facility]. Rn gave verbal orders per DR. Sethi for OT, PT, and nursing care. Rn stated needs to schedule a follow up hospital appt at our office in 4 weeks. Rosanne AshingJim stated pt is looking for a new PCP. Rn stated pt has hypertension, and diabetes, that needs to be closely manage by a PCP. RN requested nursing orders help pt find a new PCP. Rosanne AshingJim verbalized understanding.

## 2018-03-07 NOTE — Telephone Encounter (Signed)
[facility] called wanting to inform Dr. Pearlean BrownieSethi of a few different things he was made aware of during pts evaluation. Stating that the pt has a history of strokes 3-4 within the last 7 years. Blood sugar stays between low on the meter and 196. Pt states she has no PCP also while checking her BP seating- 160/98 standing- 140/90(dizzy), over the weekend due to dizziness the pt fell no injuries. Also wanting to make Dr. Pearlean BrownieSethi aware the pt did not pick up her Keppra medication that was prescribed. Would recommend OT and nursing aid. Requesting a call back for VO 2x2, 1x1 [phone]

## 2018-03-07 NOTE — Telephone Encounter (Signed)
Left vm for Jim [facility] about [facility].

## 2018-03-08 ENCOUNTER — Other Ambulatory Visit: Payer: Self-pay

## 2018-03-08 ENCOUNTER — Encounter (HOSPITAL_COMMUNITY): Payer: Self-pay | Admitting: *Deleted

## 2018-03-08 ENCOUNTER — Inpatient Hospital Stay (HOSPITAL_COMMUNITY)
Admission: AD | Admit: 2018-03-08 | Discharge: 2018-03-11 | DRG: 065 | Disposition: A | Discharge status: Rehabilitation Facility | Payer: 59 | Source: Other Acute Inpatient Hospital | Attending: Family Medicine | Admitting: Family Medicine

## 2018-03-08 DIAGNOSIS — I639 Cerebral infarction, unspecified: Secondary | ICD-10-CM | POA: Diagnosis not present

## 2018-03-08 DIAGNOSIS — I63311 Cerebral infarction due to thrombosis of right middle cerebral artery: Secondary | ICD-10-CM | POA: Diagnosis not present

## 2018-03-08 DIAGNOSIS — N182 Chronic kidney disease, stage 2 (mild): Secondary | ICD-10-CM | POA: Diagnosis not present

## 2018-03-08 DIAGNOSIS — Z9989 Dependence on other enabling machines and devices: Secondary | ICD-10-CM | POA: Diagnosis not present

## 2018-03-08 DIAGNOSIS — M797 Fibromyalgia: Secondary | ICD-10-CM | POA: Diagnosis present

## 2018-03-08 DIAGNOSIS — Z66 Do not resuscitate: Secondary | ICD-10-CM | POA: Diagnosis present

## 2018-03-08 DIAGNOSIS — G4733 Obstructive sleep apnea (adult) (pediatric): Secondary | ICD-10-CM | POA: Diagnosis not present

## 2018-03-08 DIAGNOSIS — I63521 Cerebral infarction due to unspecified occlusion or stenosis of right anterior cerebral artery: Principal | ICD-10-CM | POA: Diagnosis present

## 2018-03-08 DIAGNOSIS — R569 Unspecified convulsions: Secondary | ICD-10-CM

## 2018-03-08 DIAGNOSIS — E781 Pure hyperglyceridemia: Secondary | ICD-10-CM | POA: Diagnosis not present

## 2018-03-08 DIAGNOSIS — G40909 Epilepsy, unspecified, not intractable, without status epilepticus: Secondary | ICD-10-CM | POA: Diagnosis not present

## 2018-03-08 DIAGNOSIS — E1165 Type 2 diabetes mellitus with hyperglycemia: Secondary | ICD-10-CM | POA: Diagnosis not present

## 2018-03-08 DIAGNOSIS — F319 Bipolar disorder, unspecified: Secondary | ICD-10-CM | POA: Diagnosis not present

## 2018-03-08 DIAGNOSIS — J449 Chronic obstructive pulmonary disease, unspecified: Secondary | ICD-10-CM | POA: Diagnosis present

## 2018-03-08 DIAGNOSIS — Z794 Long term (current) use of insulin: Secondary | ICD-10-CM | POA: Diagnosis not present

## 2018-03-08 DIAGNOSIS — E1142 Type 2 diabetes mellitus with diabetic polyneuropathy: Secondary | ICD-10-CM | POA: Diagnosis not present

## 2018-03-08 DIAGNOSIS — I69354 Hemiplegia and hemiparesis following cerebral infarction affecting left non-dominant side: Secondary | ICD-10-CM

## 2018-03-08 DIAGNOSIS — Z9114 Patient's other noncompliance with medication regimen: Secondary | ICD-10-CM

## 2018-03-08 DIAGNOSIS — I10 Essential (primary) hypertension: Secondary | ICD-10-CM | POA: Diagnosis present

## 2018-03-08 DIAGNOSIS — N189 Chronic kidney disease, unspecified: Secondary | ICD-10-CM

## 2018-03-08 DIAGNOSIS — Z6839 Body mass index (BMI) 39.0-39.9, adult: Secondary | ICD-10-CM | POA: Diagnosis not present

## 2018-03-08 DIAGNOSIS — E1151 Type 2 diabetes mellitus with diabetic peripheral angiopathy without gangrene: Secondary | ICD-10-CM | POA: Diagnosis present

## 2018-03-08 DIAGNOSIS — E785 Hyperlipidemia, unspecified: Secondary | ICD-10-CM | POA: Diagnosis present

## 2018-03-08 DIAGNOSIS — I6603 Occlusion and stenosis of bilateral middle cerebral arteries: Secondary | ICD-10-CM | POA: Diagnosis not present

## 2018-03-08 DIAGNOSIS — I2510 Atherosclerotic heart disease of native coronary artery without angina pectoris: Secondary | ICD-10-CM | POA: Diagnosis present

## 2018-03-08 DIAGNOSIS — F418 Other specified anxiety disorders: Secondary | ICD-10-CM | POA: Diagnosis not present

## 2018-03-08 DIAGNOSIS — E1122 Type 2 diabetes mellitus with diabetic chronic kidney disease: Secondary | ICD-10-CM | POA: Diagnosis not present

## 2018-03-08 DIAGNOSIS — I129 Hypertensive chronic kidney disease with stage 1 through stage 4 chronic kidney disease, or unspecified chronic kidney disease: Secondary | ICD-10-CM | POA: Diagnosis present

## 2018-03-08 DIAGNOSIS — Z79899 Other long term (current) drug therapy: Secondary | ICD-10-CM

## 2018-03-08 DIAGNOSIS — I5032 Chronic diastolic (congestive) heart failure: Secondary | ICD-10-CM | POA: Diagnosis not present

## 2018-03-08 DIAGNOSIS — Z8673 Personal history of transient ischemic attack (TIA), and cerebral infarction without residual deficits: Secondary | ICD-10-CM

## 2018-03-08 DIAGNOSIS — G8114 Spastic hemiplegia affecting left nondominant side: Secondary | ICD-10-CM | POA: Diagnosis not present

## 2018-03-08 DIAGNOSIS — Z7902 Long term (current) use of antithrombotics/antiplatelets: Secondary | ICD-10-CM | POA: Diagnosis not present

## 2018-03-08 DIAGNOSIS — N183 Chronic kidney disease, stage 3 (moderate): Secondary | ICD-10-CM

## 2018-03-08 DIAGNOSIS — Z7982 Long term (current) use of aspirin: Secondary | ICD-10-CM

## 2018-03-08 DIAGNOSIS — I63321 Cerebral infarction due to thrombosis of right anterior cerebral artery: Secondary | ICD-10-CM | POA: Diagnosis not present

## 2018-03-08 DIAGNOSIS — K219 Gastro-esophageal reflux disease without esophagitis: Secondary | ICD-10-CM | POA: Diagnosis present

## 2018-03-08 DIAGNOSIS — E119 Type 2 diabetes mellitus without complications: Secondary | ICD-10-CM

## 2018-03-08 DIAGNOSIS — E039 Hypothyroidism, unspecified: Secondary | ICD-10-CM | POA: Diagnosis not present

## 2018-03-08 DIAGNOSIS — Z950 Presence of cardiac pacemaker: Secondary | ICD-10-CM

## 2018-03-08 HISTORY — DX: Hypothyroidism, unspecified: E03.9

## 2018-03-08 HISTORY — DX: Anxiety disorder, unspecified: F41.9

## 2018-03-08 LAB — GLUCOSE, CAPILLARY
GLUCOSE-CAPILLARY: 182 mg/dL — AB (ref 70–99)
Glucose-Capillary: 238 mg/dL — ABNORMAL HIGH (ref 70–99)

## 2018-03-08 LAB — PROTIME-INR
INR: 0.92
Prothrombin Time: 12.3 seconds (ref 11.4–15.2)

## 2018-03-08 MED ORDER — STROKE: EARLY STAGES OF RECOVERY BOOK
Freq: Once | Status: AC
  Administered 2018-03-08: 18:00:00
  Filled 2018-03-08: qty 1

## 2018-03-08 MED ORDER — ONDANSETRON HCL 4 MG PO TABS: 4.0000 mg | ORAL_TABLET | Freq: Four times a day (QID) | ORAL | Status: DC | PRN

## 2018-03-08 MED ORDER — INSULIN ASPART 100 UNIT/ML ~~LOC~~ SOLN
0.0000 [IU] | Freq: Three times a day (TID) | SUBCUTANEOUS | Status: DC
  Administered 2018-03-08: 4 [IU] via SUBCUTANEOUS
  Administered 2018-03-09: 7 [IU] via SUBCUTANEOUS
  Administered 2018-03-09: 4 [IU] via SUBCUTANEOUS
  Administered 2018-03-09: 7 [IU] via SUBCUTANEOUS
  Administered 2018-03-10: 4 [IU] via SUBCUTANEOUS
  Administered 2018-03-10: 3 [IU] via SUBCUTANEOUS
  Administered 2018-03-11: 4 [IU] via SUBCUTANEOUS

## 2018-03-08 MED ORDER — ONDANSETRON HCL 4 MG/2ML IJ SOLN: 4.0000 mg | Freq: Four times a day (QID) | INTRAMUSCULAR | Status: DC | PRN

## 2018-03-08 MED ORDER — ACETAMINOPHEN 650 MG RE SUPP: 650.0000 mg | Freq: Four times a day (QID) | RECTAL | Status: DC | PRN

## 2018-03-08 MED ORDER — ACETAMINOPHEN 325 MG PO TABS
650.0000 mg | ORAL_TABLET | Freq: Four times a day (QID) | ORAL | Status: DC | PRN
  Administered 2018-03-09 (×2): 650 mg via ORAL
  Filled 2018-03-08 (×2): qty 2

## 2018-03-08 MED FILL — Ondansetron HCl Inj 4 MG/2ML (2 MG/ML): INTRAMUSCULAR | Qty: 2 | Status: AC

## 2018-03-08 NOTE — Progress Notes (Signed)
New Admission Note:  Arrival Method:Via carelink  Mental Orientation:alert & oriented x 4 Assessment: Completed Skin: intact IV:R AC Pain:0/10 Safety Measures: Safety Fall Prevention Plan was given, discussed. Admission: Completed 3W: Patient has been orientated to the room, unit and the staff. Family:family updated Orders have been reviewed and implemented. Will continue to monitor the patient. Call light has been placed within reach and bed alarm has been activated.   Lawernce IonYari Hiroko Tregre ,RN

## 2018-03-08 NOTE — H&P (Signed)
History and Physical    Jessica Breedingancy Pytel JWJ:[id number] DOB: 08-16-1965 DOA: 03/08/2018  PCP: Abigail MiyamotoPerry, Lawrence Edward, MD   Patient coming from: Home.  I have personally briefly reviewed patient's [facility]  Chief Complaint: Worsening neuro deficits.  HPI: [name] is a 52 y.o. female with medical history significant of anxiety, chronic kidney disease, type 2 diabetes, fibromyalgia, headache, history of urolithiasis, hyperlipidemia, hypertension, hypothyroidism, insomnia chronic pancreatitis, sleep apnea, recent CVA who is coming from [facility] due to recrudescence of recent CVA, which presented with worsening weakness of LLE.  The patient was recently admitted from 03/01/2018-03/02/18 for left-sided weakness, numbness, and blurred vision found to have acute patchy infarcts in the posterior right ACA and posterior ACA/MCA watershed territory.  She had associated new onset seizures and was started on Keppra.  [facility] was [facility]/02/2018 for TEE.  TEE was unable to be completed.  She was seen by the stroke team and was eventually discharged (on 03/04/2018) on aspirin 81 mg and clopidogrel 75 mg daily (plan for 3 months then switched to clopidogrel alone).  She had some improvement in her left-sided weakness but not back to baseline.  PT/OT evaluations recommended skilled nursing facility on discharged however she declined and was discharged home with home health.  She had reportedly not been taking any of her medications for several months prior to this recent admission.   Around 12:30 p.m. on 03/07/18 she notice worsening left lower extremity weakness with difficulty ambulating.  She had decreased sensation her left arm and left leg.  She has noticed worsening blurry vision.  She denied any associated chest pain, palpitations, dyspnea, nausea, vomiting.  She said she has been able to take obtain and take her medications the last 2  days.  In the ED, initial vitals showed BP 146/80, pulse 80, RR 20, temp 97.8 F, SpO2 100% on room air.  Code stroke was called and the CT head without contrast showed known subacute infarct in the right ACA territory and remote right occipital and inferior cerebellar infarcts without intracranial hemorrhage or other acute finding.  Tele neurology were consulted and recommended MRI head.  MRI head showed slight interval expansion/worsening of right ACA territory infarct compared to a prior from 03/01/2018 with new patchy involvement of the right corpus callosum.  Lab work was notable for creatinine 1.7 (compared to 1.48 on 03/03/2018), AST 57, ALT 61, alkaline phosphatase 259, troponin I < 0.01.  CBC was within normal limits.  Previous CTA reviewed in concerning for significant right MCA disease.  Discussed with Neuro Interventional radiologist Dr. Corliss Skainseveshwar who recommends [facility] so patient can undergo diagnostic arteriogram to determine if intervention indicated.  [facility].  Neuro interventional Radiology needs to be contacted upon patient arrival.  Neuro Hospitals also need to be consulted in the event patient does undergo interventional procedure and would require postprocedure ICU care.  She denies vision changes, dizziness or slurred speech.  No chest pain, palpitations, dizziness, diaphoresis, PND orthopnea.  Denies abdominal pain, nausea, emesis, diarrhea,, melena or hematochezia.  She complains of constipation.  She denies dysuria, frequency or hematuria.  ED Course: Not applied.  Review of Systems: As above.   Past Medical History:  Diagnosis Date  . Anxiety   . Chronic kidney disease   . Diabetes mellitus without complication (HCC)   . Fibromyalgia   . Headache   . History of kidney  stones   . Hyperlipemia   . Hypertension   . Hypothyroidism   . Insomnia   . Pancreatitis    chronic  . Sleep apnea   . Stroke (cerebrum) [facility])      Past Surgical History:  Procedure Laterality Date  . ABDOMINAL HYSTERECTOMY    . CHOLECYSTECTOMY    . ROTATOR CUFF REPAIR Right   . SHOULDER SURGERY Left      reports that she has never smoked. She has never used smokeless tobacco. She reports that she does not drink alcohol or use drugs.  No Known Allergies  Family History  Problem Relation Age of Onset  . Diabetes Neg Hx     Prior to Admission medications   Medication Sig Start Date End Date Taking? Authorizing Provider  acetaminophen (TYLENOL) 500 MG tablet Take 1,000 mg by mouth every 6 (six) hours as needed for mild pain or headache.    [provider]  amLODipine (NORVASC) 10 MG tablet Take 10 mg by mouth daily.     [provider]  aspirin EC 81 MG EC tablet Take 1 tablet (81 mg total) by mouth daily. 03/04/18   Calvert Cantor, MD  atorvastatin (LIPITOR) 80 MG tablet Take 1 tablet (80 mg total) by mouth at bedtime. 03/04/18   Calvert Cantor, MD  carvedilol (COREG) 25 MG tablet Take 25 mg by mouth 2 (two) times daily with a meal.     [provider]  chlorthalidone (HYGROTON) 25 MG tablet Take 25 mg by mouth daily.    [provider]  clopidogrel (PLAVIX) 75 MG tablet Take 1 tablet (75 mg total) by mouth daily. 03/04/18   Calvert Cantor, MD  DULoxetine (CYMBALTA) 60 MG capsule Take 60 mg by mouth daily.    [provider]  hydrALAZINE (APRESOLINE) 50 MG tablet Take 50 mg by mouth 3 (three) times daily.    [provider]  insulin aspart (NOVOLOG FLEXPEN) 100 UNIT/ML FlexPen Inject 1-15 Units into the skin 3 (three) times daily with meals. CBG 121 - 150: 2 units  CBG 151 - 200: 3 units  CBG 201 - 250: 5 units  CBG 251 - 300: 8 units  CBG 301 - 350: 11 units  CBG 351 - 400: 15 units 03/04/18   Rizwan, Ladell Heads, MD  insulin aspart (NOVOLOG) 100 UNIT/ML injection Inject 5 Units into the skin 3 (three) times daily with meals. 03/04/18   Calvert Cantor, MD  Insulin Glargine  (LANTUS SOLOSTAR) 100 UNIT/ML Solostar Pen Inject 30 Units into the skin at bedtime. 03/05/18   Calvert Cantor, MD  Insulin Pen Needle 31G X 4 MM MISC 1 each by Does not apply route 4 (four) times daily -  before meals and at bedtime. 03/04/18   Calvert Cantor, MD  levETIRAcetam (KEPPRA) 500 MG tablet Take 1 tablet (500 mg total) by mouth 2 (two) times daily. 03/04/18   Calvert Cantor, MD  lisinopril (PRINIVIL,ZESTRIL) 40 MG tablet Take 40 mg by mouth daily.    [provider]  QUEtiapine (SEROQUEL) 50 MG tablet Take 50 mg by mouth at bedtime. 01/13/18   [provider]  senna-docusate (SENOKOT-S) 8.6-50 MG tablet Take 1 tablet by mouth at bedtime as needed for moderate constipation. 03/04/18   Calvert Cantor, MD    Physical Exam: Vitals:   03/08/18 1500 03/08/18 1700  BP: (!) 158/90 (!) 176/98  Pulse: 81 79  Resp:  18  Temp: 99.3 F (37.4 C) 98.9 F (37.2 C)  TempSrc: Oral Oral  SpO2: 92% 95%  Weight:  102.4 kg  Height:  5\' 5"  (1.651 m)    Constitutional: NAD, calm, comfortable Eyes: PERRL, lids and conjunctivae normal ENMT: Mucous membranes are moist. Posterior pharynx clear of any exudate or lesions. Neck: normal, supple, no masses, no thyromegaly Respiratory: clear to auscultation bilaterally, no wheezing, no crackles. Normal respiratory effort. No accessory muscle use.  Cardiovascular: Regular rate and rhythm, no murmurs / rubs / gallops. No extremity edema. 2+ pedal pulses. No carotid bruits.  Abdomen: no tenderness, no masses palpated. No hepatosplenomegaly. Bowel sounds positive.  Musculoskeletal: no clubbing / cyanosis.  Good ROM, no contractures. Normal muscle tone.  Skin: no rashes, lesions, ulcers on limited dermatological examination. Neurologic: CN 2-12 grossly intact. Sensation intact, DTR normal.  4/5 LLE weakness Psychiatric: Normal judgment and insight. Alert and oriented x 3. Normal mood.    Labs on Admission: I have personally reviewed following  labs and imaging studies  CBC: Recent Labs  Lab 03/02/18 2149  WBC 8.7  HGB 13.4  HCT 41.0  MCV 85.6  PLT 329   Basic Metabolic Panel: Recent Labs  Lab 03/02/18 2149 03/03/18 0934  NA  --  135  K  --  3.7  CL  --  104  CO2  --  24  GLUCOSE  --  301*  BUN  --  19  CREATININE 1.57* 1.48*  CALCIUM  --  8.5*   GFR: Estimated Creatinine Clearance: 54 mL/min (A) (by C-G formula based on SCr of 1.48 mg/dL (H)). Liver Function Tests: Recent Labs  Lab 03/03/18 0934  AST 36  ALT 35  ALKPHOS 184*  BILITOT 0.6  PROT 5.8*  ALBUMIN 2.4*   No results for input(s): LIPASE, AMYLASE in the last 168 hours. No results for input(s): AMMONIA in the last 168 hours. Coagulation Profile: Recent Labs  Lab 03/08/18 1611  INR 0.92   Cardiac Enzymes: No results for input(s): CKTOTAL, CKMB, CKMBINDEX, TROPONINI in the last 168 hours. BNP (last 3 results) No results for input(s): PROBNP in the last 8760 hours. HbA1C: No results for input(s): HGBA1C in the last 72 hours. CBG: Recent Labs  Lab 03/04/18 0346 03/04/18 0810 03/04/18 1210 03/08/18 1519 03/08/18 1712  GLUCAP 179* 166* 182* 238* 182*   Lipid Profile: No results for input(s): CHOL, HDL, LDLCALC, TRIG, CHOLHDL, LDLDIRECT in the last 72 hours. Thyroid Function Tests: No results for input(s): TSH, T4TOTAL, FREET4, T3FREE, THYROIDAB in the last 72 hours. Anemia Panel: No results for input(s): VITAMINB12, FOLATE, FERRITIN, TIBC, IRON, RETICCTPCT in the last 72 hours. Urine analysis: No results found for: COLORURINE, APPEARANCEUR, LABSPEC, PHURINE, GLUCOSEU, HGBUR, BILIRUBINUR, KETONESUR, PROTEINUR, UROBILINOGEN, NITRITE, LEUKOCYTESUR  Radiological Exams on Admission: No results found.  EKG: Independently reviewed.  Assessment/Plan Principal Problem:   Acute CVA (cerebrovascular accident) (HCC) Admit to neuro unit/telemetry. Frequent neuro checks. Continue aspirin. Hold Plavix pending procedure. Allow permissive  hypertension. IR will evaluate and proceed tomorrow.  Active Problems:   Type 2 diabetes mellitus (HCC) Carb modified diet. CBG monitoring with RI SS.    Hypothyroidism Continue levothyroxine. Check TSH as needed,    HTN (hypertension) Allowing permissive hypertension. Monitor blood pressure    Dyslipidemia Continue atorvastatin.    Seizures (HCC) Continue Keppra.   DVT prophylaxis: SQ heparin. Code Status: Full code. Family Communication: None at bedside. Disposition Plan: Admit to stepdown for IR evaluation.  Will need neuro ICU and neuro hospitalist team to follow after procedure tomorrow. Consults called:  IR (Dr. Brenton Grills) Admission status: Inpatient/stepdown.   Bobette Mo MD [facility] Pager [phone].  If 7PM-7AM, please contact night-coverage [email or web address] Password [facility]  03/08/2018, 6:21 PM

## 2018-03-09 ENCOUNTER — Encounter (HOSPITAL_COMMUNITY): Payer: Self-pay | Admitting: Nephrology

## 2018-03-09 ENCOUNTER — Inpatient Hospital Stay (HOSPITAL_COMMUNITY): Payer: 59

## 2018-03-09 DIAGNOSIS — R569 Unspecified convulsions: Secondary | ICD-10-CM

## 2018-03-09 DIAGNOSIS — E119 Type 2 diabetes mellitus without complications: Secondary | ICD-10-CM

## 2018-03-09 DIAGNOSIS — I10 Essential (primary) hypertension: Secondary | ICD-10-CM

## 2018-03-09 HISTORY — PX: IR ANGIO INTRA EXTRACRAN SEL COM CAROTID INNOMINATE BILAT MOD SED: IMG5360

## 2018-03-09 HISTORY — PX: IR ANGIO VERTEBRAL SEL VERTEBRAL UNI L MOD SED: IMG5367

## 2018-03-09 HISTORY — PX: IR ANGIO VERTEBRAL SEL SUBCLAVIAN INNOMINATE UNI R MOD SED: IMG5365

## 2018-03-09 HISTORY — PX: IR US GUIDE VASC ACCESS RIGHT: IMG2390

## 2018-03-09 LAB — COMPREHENSIVE METABOLIC PANEL
ALT: 63 U/L — ABNORMAL HIGH (ref 0–44)
AST: 59 U/L — AB (ref 15–41)
Albumin: 2.6 g/dL — ABNORMAL LOW (ref 3.5–5.0)
Alkaline Phosphatase: UNDETERMINED U/L (ref 38–126)
Anion gap: 9 (ref 5–15)
BILIRUBIN TOTAL: 0.4 mg/dL (ref 0.3–1.2)
BUN: 24 mg/dL — AB (ref 6–20)
CO2: 19 mmol/L — ABNORMAL LOW (ref 22–32)
CREATININE: 1.59 mg/dL — AB (ref 0.44–1.00)
Calcium: 8.7 mg/dL — ABNORMAL LOW (ref 8.9–10.3)
Chloride: 107 mmol/L (ref 98–111)
GFR calc Af Amer: 43 mL/min — ABNORMAL LOW (ref >60)
GFR, EST NON AFRICAN AMERICAN: 37 mL/min — AB (ref >60)
Glucose, Bld: 199 mg/dL — ABNORMAL HIGH (ref 70–99)
POTASSIUM: 4.7 mmol/L (ref 3.5–5.1)
Sodium: 135 mmol/L (ref 135–145)
TOTAL PROTEIN: UNDETERMINED g/dL (ref 6.5–8.1)

## 2018-03-09 LAB — GLUCOSE, CAPILLARY
GLUCOSE-CAPILLARY: 244 mg/dL — AB (ref 70–99)
GLUCOSE-CAPILLARY: 206 mg/dL — AB (ref 70–99)
GLUCOSE-CAPILLARY: 186 mg/dL — AB (ref 70–99)
GLUCOSE-CAPILLARY: 221 mg/dL — AB (ref 70–99)

## 2018-03-09 LAB — LIPID PANEL
Cholesterol: 313 mg/dL — ABNORMAL HIGH (ref 0–200)
HDL: 36 mg/dL — AB (ref >40)
LDL CALC: UNDETERMINED mg/dL (ref 0–99)
TRIGLYCERIDES: 529 mg/dL — AB (ref <150)
Total CHOL/HDL Ratio: 8.7 RATIO
VLDL: UNDETERMINED mg/dL (ref 0–40)

## 2018-03-09 LAB — CBC WITH DIFFERENTIAL/PLATELET
ABS IMMATURE GRANULOCYTES: 0.02 10*3/uL (ref 0.00–0.07)
BASOS ABS: 0.1 10*3/uL (ref 0.0–0.1)
Basophils Relative: 1 %
EOS ABS: 0.4 10*3/uL (ref 0.0–0.5)
Eosinophils Relative: 5 %
HCT: 43.5 % (ref 36.0–46.0)
HEMOGLOBIN: 13.6 g/dL (ref 12.0–15.0)
IMMATURE GRANULOCYTES: 0 %
LYMPHS ABS: 2.4 10*3/uL (ref 0.7–4.0)
Lymphocytes Relative: 30 %
MCH: 27.5 pg (ref 26.0–34.0)
MCHC: 31.3 g/dL (ref 30.0–36.0)
MCV: 87.9 fL (ref 80.0–100.0)
MONO ABS: 0.6 10*3/uL (ref 0.1–1.0)
MONOS PCT: 8 %
NEUTROS PCT: 56 %
Neutro Abs: 4.4 10*3/uL (ref 1.7–7.7)
Platelets: 323 10*3/uL (ref 150–400)
RBC: 4.95 MIL/uL (ref 3.87–5.11)
RDW: 12.8 % (ref 11.5–15.5)
WBC: 7.9 10*3/uL (ref 4.0–10.5)
nRBC: 0 % (ref 0.0–0.2)

## 2018-03-09 LAB — HEMOGLOBIN A1C
HEMOGLOBIN A1C: 12.9 % — AB (ref 4.8–5.6)
MEAN PLASMA GLUCOSE: 323.53 mg/dL

## 2018-03-09 LAB — APTT: aPTT: 27 seconds (ref 24–36)

## 2018-03-09 MED ORDER — QUETIAPINE FUMARATE 50 MG PO TABS
50.0000 mg | ORAL_TABLET | Freq: Every day | ORAL | Status: DC
  Administered 2018-03-09 – 2018-03-10 (×2): 50 mg via ORAL
  Filled 2018-03-09 (×2): qty 1

## 2018-03-09 MED ORDER — VERAPAMIL HCL 2.5 MG/ML IV SOLN
INTRA_ARTERIAL | Status: AC | PRN
  Administered 2018-03-09: 14:00:00 via INTRA_ARTERIAL

## 2018-03-09 MED ORDER — SODIUM CHLORIDE 0.9 % IV SOLN
INTRAVENOUS | Status: AC | PRN
  Administered 2018-03-09: 250 mL via INTRAVENOUS

## 2018-03-09 MED ORDER — MIDAZOLAM HCL 2 MG/2ML IJ SOLN
INTRAMUSCULAR | Status: AC
  Filled 2018-03-09: qty 2

## 2018-03-09 MED ORDER — NITROGLYCERIN 1 MG/10 ML FOR IR/CATH LAB
INTRA_ARTERIAL | Status: AC | PRN
  Administered 2018-03-09: 200 ug via INTRA_ARTERIAL

## 2018-03-09 MED ORDER — FENTANYL CITRATE (PF) 100 MCG/2ML IJ SOLN
INTRAMUSCULAR | Status: AC
  Filled 2018-03-09: qty 2

## 2018-03-09 MED ORDER — HYDRALAZINE HCL 20 MG/ML IJ SOLN
INTRAMUSCULAR | Status: AC
  Filled 2018-03-09: qty 1

## 2018-03-09 MED ORDER — LEVETIRACETAM 500 MG PO TABS
500.0000 mg | ORAL_TABLET | Freq: Two times a day (BID) | ORAL | Status: DC
  Administered 2018-03-09 – 2018-03-11 (×5): 500 mg via ORAL
  Filled 2018-03-09 (×5): qty 1

## 2018-03-09 MED ORDER — HYDRALAZINE HCL 20 MG/ML IJ SOLN
INTRAMUSCULAR | Status: AC | PRN
  Administered 2018-03-09: 5 mg via INTRAVENOUS

## 2018-03-09 MED ORDER — MIDAZOLAM HCL 2 MG/2ML IJ SOLN
INTRAMUSCULAR | Status: AC | PRN
  Administered 2018-03-09: 1 mg via INTRAVENOUS

## 2018-03-09 MED ORDER — DULOXETINE HCL 60 MG PO CPEP
60.0000 mg | ORAL_CAPSULE | Freq: Every day | ORAL | Status: DC
  Administered 2018-03-09 – 2018-03-11 (×3): 60 mg via ORAL
  Filled 2018-03-09 (×3): qty 1

## 2018-03-09 MED ORDER — BISACODYL 5 MG PO TBEC
10.0000 mg | DELAYED_RELEASE_TABLET | Freq: Once | ORAL | Status: AC
  Administered 2018-03-11: 10 mg via ORAL
  Filled 2018-03-09: qty 2

## 2018-03-09 MED ORDER — IOHEXOL 300 MG/ML  SOLN
150.0000 mL | Freq: Once | INTRAMUSCULAR | Status: AC | PRN
  Administered 2018-03-09: 60 mL via INTRA_ARTERIAL

## 2018-03-09 MED ORDER — IOPAMIDOL (ISOVUE-300) INJECTION 61%
INTRAVENOUS | Status: AC
  Administered 2018-03-09: 15 mL
  Filled 2018-03-09: qty 50

## 2018-03-09 MED ORDER — ASPIRIN EC 81 MG PO TBEC
81.0000 mg | DELAYED_RELEASE_TABLET | Freq: Every day | ORAL | Status: DC
  Administered 2018-03-09 – 2018-03-11 (×3): 81 mg via ORAL
  Filled 2018-03-09 (×4): qty 1

## 2018-03-09 MED ORDER — LIDOCAINE HCL (PF) 1 % IJ SOLN
INTRAMUSCULAR | Status: AC | PRN
  Administered 2018-03-09: 5 mL

## 2018-03-09 MED ORDER — ATORVASTATIN CALCIUM 80 MG PO TABS
80.0000 mg | ORAL_TABLET | Freq: Every day | ORAL | Status: DC
  Administered 2018-03-09 – 2018-03-10 (×2): 80 mg via ORAL
  Filled 2018-03-09 (×2): qty 1

## 2018-03-09 MED ORDER — SODIUM CHLORIDE 0.9 % IV SOLN
INTRAVENOUS | Status: AC
  Administered 2018-03-09: 16:00:00 via INTRAVENOUS

## 2018-03-09 MED ORDER — NITROGLYCERIN 1 MG/10 ML FOR IR/CATH LAB
INTRA_ARTERIAL | Status: AC
  Filled 2018-03-09: qty 10

## 2018-03-09 MED ORDER — LIDOCAINE HCL 1 % IJ SOLN
INTRAMUSCULAR | Status: AC
  Filled 2018-03-09: qty 20

## 2018-03-09 MED ORDER — VERAPAMIL HCL 2.5 MG/ML IV SOLN
INTRAVENOUS | Status: AC
  Filled 2018-03-09: qty 2

## 2018-03-09 MED ORDER — HEPARIN SODIUM (PORCINE) 1000 UNIT/ML IJ SOLN
INTRAMUSCULAR | Status: AC
  Filled 2018-03-09: qty 2

## 2018-03-09 MED ORDER — SENNOSIDES-DOCUSATE SODIUM 8.6-50 MG PO TABS: 1.0000 | ORAL_TABLET | Freq: Every evening | ORAL | Status: DC | PRN

## 2018-03-09 MED ORDER — BISACODYL 10 MG RE SUPP: 10.0000 mg | Freq: Every day | RECTAL | Status: DC | PRN

## 2018-03-09 MED ORDER — INSULIN GLARGINE 100 UNIT/ML ~~LOC~~ SOLN
30.0000 [IU] | Freq: Every day | SUBCUTANEOUS | Status: DC
  Administered 2018-03-09 – 2018-03-10 (×2): 30 [IU] via SUBCUTANEOUS
  Filled 2018-03-09 (×2): qty 0.3

## 2018-03-09 MED ORDER — HEPARIN SODIUM (PORCINE) 1000 UNIT/ML IJ SOLN
INTRAMUSCULAR | Status: AC
  Filled 2018-03-09: qty 1

## 2018-03-09 MED ORDER — HYDRALAZINE HCL 20 MG/ML IJ SOLN
10.0000 mg | Freq: Once | INTRAMUSCULAR | Status: AC
  Administered 2018-03-09: 10 mg via INTRAVENOUS
  Filled 2018-03-09: qty 1

## 2018-03-09 MED ORDER — SODIUM CHLORIDE 0.9 % IV SOLN
INTRAVENOUS | Status: DC
  Administered 2018-03-09 – 2018-03-10 (×2): via INTRAVENOUS

## 2018-03-09 MED ORDER — FENTANYL CITRATE (PF) 100 MCG/2ML IJ SOLN
INTRAMUSCULAR | Status: AC | PRN
  Administered 2018-03-09: 25 ug via INTRAVENOUS

## 2018-03-09 MED ORDER — HEPARIN SODIUM (PORCINE) 1000 UNIT/ML IJ SOLN
INTRAMUSCULAR | Status: AC | PRN
  Administered 2018-03-09: 2000 [IU] via INTRA_ARTERIAL

## 2018-03-09 NOTE — Consult Note (Signed)
Chief Complaint: Patient was seen in consultation today for acute CVA.  Referring Physician(s): Delano Metz  Supervising Physician: Julieanne Cotton  Patient Status: [facility] - In-pt  History of Present Illness: [name] is a 52 y.o. female with a past medical history of hypertension, hyperlipidemia, CAD, PVD, MI, HF, s/p pacemaker, CVA 02/2018, COPD, pneumonia, asthma, GERD, hepatitis, pancreatitis, CKD, nephrolithiasis, diabetes mellitus, anemia, hypothyroidism, fibromyalgia, arthritis, sleep apnea, schizophrenia, bipolar disorder, insomnia, anxiety, and depression. She was recently admitted to [facility] 03/01/2018-03/04/2018 for management of acute CVA (right ACA territory) which presented as left-sided weakness/numbness and blurred vision. She was discharged home 03/04/2018 on Plavix 75 mg once daily and Aspirin 81 mg once daily. Upon discharge, she had improvement of her left-sided weakness, but not return to baseline. PT/OT recommended D/C to SNF, however patient declined and was discharged home with home health. On 03/07/2018, patient noticed worsening LLE weakness/decreased sensation, difficulty ambulating, and worsening blurred vision. She presented to [facility] ED for worsening symptoms. She was found to have slightly worsening/extension of prior right ACA territory infarct. Code stroke was activated and patient was transferred to [facility] for further evaluation.  IR requested by Dr. Arlean Hopping for possible image-guided diagnostic cerebral angiogram. Patient awake and alert laying in bed. Complains of headache, rated 10/10 at this time. Denies N/V associated with headache. Complains of constant bilateral blurred vision, stable since admission 03/07/2018. Complains of LLE weakness, stable since admission 03/07/2018. Complains of dizziness when changing positions. Denies syncope associated with dizziness. Denies fever, chills, chest pain, dyspnea, abdominal pain, diplopia, hearing changes,  tinnitus, or speech difficulty.  Patient is currently taking Plavix 75 mg once daily and Aspirin 81 mg once daily.   Past Medical History:  Diagnosis Date  . Anxiety   . Chronic kidney disease   . Diabetes mellitus without complication (HCC)   . Fibromyalgia   . Headache   . History of kidney stones   . Hyperlipemia   . Hypertension   . Hypothyroidism   . Insomnia   . Pancreatitis    chronic  . Sleep apnea   . Stroke (cerebrum) [facility])     Past Surgical History:  Procedure Laterality Date  . ABDOMINAL HYSTERECTOMY    . CHOLECYSTECTOMY    . ROTATOR CUFF REPAIR Right   . SHOULDER SURGERY Left     Allergies: Patient has no known allergies.  Medications: Prior to Admission medications   Medication Sig Start Date End Date Taking? Authorizing Provider  acetaminophen (TYLENOL) 500 MG tablet Take 1,000 mg by mouth every 6 (six) hours as needed for mild pain or headache.    [provider]  amLODipine (NORVASC) 10 MG tablet Take 10 mg by mouth daily.     [provider]  aspirin EC 81 MG EC tablet Take 1 tablet (81 mg total) by mouth daily. 03/04/18   Calvert Cantor, MD  atorvastatin (LIPITOR) 80 MG tablet Take 1 tablet (80 mg total) by mouth at bedtime. 03/04/18   Calvert Cantor, MD  carvedilol (COREG) 25 MG tablet Take 25 mg by mouth 2 (two) times daily with a meal.     [provider]  chlorthalidone (HYGROTON) 25 MG tablet Take 25 mg by mouth daily.    [provider]  clopidogrel (PLAVIX) 75 MG tablet Take 1 tablet (75 mg total) by mouth daily. 03/04/18   Calvert Cantor, MD  DULoxetine (CYMBALTA) 60 MG capsule Take 60 mg by mouth daily.    [provider]  hydrALAZINE (APRESOLINE)  50 MG tablet Take 50 mg by mouth 3 (three) times daily.    [provider]  insulin aspart (NOVOLOG FLEXPEN) 100 UNIT/ML FlexPen Inject 1-15 Units into the skin 3 (three) times daily with meals. CBG 121 - 150: 2 units  CBG 151 - 200: 3 units  CBG  201 - 250: 5 units  CBG 251 - 300: 8 units  CBG 301 - 350: 11 units  CBG 351 - 400: 15 units 03/04/18   Rizwan, Ladell HeadsSaima, MD  insulin aspart (NOVOLOG) 100 UNIT/ML injection Inject 5 Units into the skin 3 (three) times daily with meals. 03/04/18   Calvert Cantorizwan, Saima, MD  Insulin Glargine (LANTUS SOLOSTAR) 100 UNIT/ML Solostar Pen Inject 30 Units into the skin at bedtime. 03/05/18   Calvert Cantorizwan, Saima, MD  Insulin Pen Needle 31G X 4 MM MISC 1 each by Does not apply route 4 (four) times daily -  before meals and at bedtime. 03/04/18   Calvert Cantorizwan, Saima, MD  levETIRAcetam (KEPPRA) 500 MG tablet Take 1 tablet (500 mg total) by mouth 2 (two) times daily. 03/04/18   Calvert Cantorizwan, Saima, MD  lisinopril (PRINIVIL,ZESTRIL) 40 MG tablet Take 40 mg by mouth daily.    [provider]  QUEtiapine (SEROQUEL) 50 MG tablet Take 50 mg by mouth at bedtime. 01/13/18   [provider]  senna-docusate (SENOKOT-S) 8.6-50 MG tablet Take 1 tablet by mouth at bedtime as needed for moderate constipation. 03/04/18   Calvert Cantorizwan, Saima, MD     Family History  Problem Relation Age of Onset  . Diabetes Neg Hx     Social History   Socioeconomic History  . Marital status: Married    Spouse name: Not on file  . Number of children: Not on file  . Years of education: Not on file  . Highest education level: Not on file  Occupational History  . Not on file  Social Needs  . Financial resource strain: Not hard at all  . Food insecurity:    Worry: Never true    Inability: Never true  . Transportation needs:    Medical: No    Non-medical: No  Tobacco Use  . Smoking status: Never Smoker  . Smokeless tobacco: Never Used  Substance and Sexual Activity  . Alcohol use: No    Alcohol/week: 0.0 standard drinks  . Drug use: No  . Sexual activity: Never  Lifestyle  . Physical activity:    Days per week: Patient refused    Minutes per session: Patient refused  . Stress: Rather much  Relationships  . Social connections:    Talks  on phone: More than three times a week    Gets together: More than three times a week    Attends religious service: More than 4 times per year    Active member of club or organization: No    Attends meetings of clubs or organizations: Never    Relationship status: Married  Other Topics Concern  . Not on file  Social History Narrative  . Not on file     Review of Systems: A 12 point ROS discussed and pertinent positives are indicated in the HPI above.  All other systems are negative.  Review of Systems  Constitutional: Negative for chills and fever.  HENT: Negative for hearing loss and tinnitus.   Eyes: Positive for visual disturbance.  Respiratory: Negative for shortness of breath and wheezing.   Cardiovascular: Negative for chest pain and palpitations.  Gastrointestinal: Negative for abdominal pain, nausea and  vomiting.  Neurological: Positive for dizziness, weakness and headaches. Negative for syncope and speech difficulty.  Psychiatric/Behavioral: Negative for behavioral problems and confusion.    Vital Signs: BP (!) 167/90 (BP Location: Left Arm)   Pulse 85   Temp 98.7 F (37.1 C) (Oral)   Resp (!) 22   Ht 5\' 5"  (1.651 m)   Wt 225 lb 12 oz (102.4 kg)   SpO2 93%   BMI 37.57 kg/m   Physical Exam  Constitutional: She is oriented to person, place, and time. She appears well-developed and well-nourished. No distress.  Cardiovascular: Normal rate, regular rhythm and normal heart sounds.  Pulmonary/Chest: Effort normal and breath sounds normal. No respiratory distress. She has no wheezes.  Neurological: She is alert and oriented to person, place, and time.  Skin: Skin is warm and dry.  Psychiatric: She has a normal mood and affect. Her behavior is normal. Judgment and thought content normal.  Nursing note and vitals reviewed.    MD Evaluation Airway: WNL Heart: WNL Abdomen: WNL Chest/ Lungs: WNL ASA  Classification: 3 Mallampati/Airway Score: Two   Imaging: No  results found.  Labs:  CBC: Recent Labs    03/02/18 2149 03/09/18 0448  WBC 8.7 7.9  HGB 13.4 13.6  HCT 41.0 43.5  PLT 329 323    COAGS: Recent Labs    03/08/18 1611 03/09/18 0743  INR 0.92  --   APTT  --  27    BMP: Recent Labs    03/02/18 2149 03/03/18 0934  NA  --  135  K  --  3.7  CL  --  104  CO2  --  24  GLUCOSE  --  301*  BUN  --  19  CALCIUM  --  8.5*  CREATININE 1.57* 1.48*  GFRNONAA 37* 40*  GFRAA 43* 47*    LIVER FUNCTION TESTS: Recent Labs    03/03/18 0934  BILITOT 0.6  AST 36  ALT 35  ALKPHOS 184*  PROT 5.8*  ALBUMIN 2.4*    TUMOR MARKERS: No results for input(s): AFPTM, CEA, CA199, CHROMGRNA in the last 8760 hours.  Assessment and Plan:  Acute CVA. Plan for image-guided diagnostic cerebral angiogram tentatively for today with Dr. Corliss Skains. Patient is NPO. Afebrile and WBCs WNL. She is taking Plavix 75 mg once daily and Aspirin 81 mg once daily- ok to proceed per Dr. Corliss Skains. INR 0.92 seconds 03/08/2018.  Risks and benefits of cerebral angiogram were discussed with the patient including, but not limited to bleeding, infection, vascular injury or contrast induced renal failure. This interventional procedure involves the use of X-rays and because of the nature of the planned procedure, it is possible that we will have prolonged use of X-ray fluoroscopy. Potential radiation risks to you include (but are not limited to) the following: - A slightly elevated risk for cancer  several years later in life. This risk is typically less than 0.5% percent. This risk is low in comparison to the normal incidence of human cancer, which is 33% for women and 50% for men according to the [employer] Cancer Society. - Radiation induced injury can include skin redness, resembling a rash, tissue breakdown / ulcers and hair loss (which can be temporary or permanent).  The likelihood of either of these occurring depends on the difficulty of the procedure and  whether you are sensitive to radiation due to previous procedures, disease, or genetic conditions.  IF your procedure requires a prolonged use of radiation, you will be notified  and given written instructions for further action.  It is your responsibility to monitor the irradiated area for the 2 weeks following the procedure and to notify your physician if you are concerned that you have suffered a radiation induced injury.   All of the patient's questions were answered, patient is agreeable to proceed. Consent signed and in chart.   Thank you for this interesting consult.  I greatly enjoyed meeting [name] and look forward to participating in their care.  A copy of this report was sent to the requesting provider on this date.  Electronically Signed: Elwin Mocha, PA-C 03/09/2018, 9:28 AM   I spent a total of 40 Minutes in face to face in clinical consultation, greater than 50% of which was counseling/coordinating care for acute CVA.

## 2018-03-09 NOTE — Sedation Documentation (Signed)
Patient is resting comfortably. 

## 2018-03-09 NOTE — Progress Notes (Signed)
Rehab Admissions Coordinator Note:  Patient was screened by Clois DupesBoyette, Wania Longstreth Godwin for appropriateness for an Inpatient Acute Rehab Consult per PT and OT recommendations. At this time, we are recommending Inpatient Rehab consult. Please place order for consult if pt would like to be considered for CIR admit. Please advise.  Ottie GlazierBarbara Abdullahi Vallone, RN, MSN Rehab Admissions Coordinator [phone] 03/09/2018 12:23 PM

## 2018-03-09 NOTE — Evaluation (Signed)
Physical Therapy Evaluation Patient Details Name: [name] MRN: [id number] DOB: 10/31/1965 Today's Date: 03/09/2018   History of Present Illness  Pt is a 52 y.o. female with medical history significant of anxiety, chronic kidney disease, type 2 diabetes, fibromyalgia, headache, history of urolithiasis, hyperlipidemia, hypertension, hypothyroidism, insomnia chronic pancreatitis, sleep apnea, recent CVA who is coming from [facility] due to recrudescence of recent CVA, which presented with worsening weakness of LLE. Admitted 03/01/18 and found to have acute patchy infarcts in the posterior R ACA and posterior ACA/MCA watershed territory, new onset seizures and started on Keppra. Utimately dc'd home on 03/04/18. On 03/07/18 re-admitted due to worsening L LE weakness and blurry vision. MRI shows slight interval expansion/worsening of R ACA territory infarct as well as new patchy invovelment of R corpous callosum. Concern for R MCA disease.  Continued workup.   Clinical Impression  Pt admitted with/for worsening of L LE weakness and blurry vision, MRI showing worsening of her R ACA territory infarct.  Pt is now at a moderate assist level for cuing and physical assist.  Pt currently limited functionally due to the problems listed. ( See problems list.)   Pt will benefit from PT to maximize function and safety in order to get ready for next venue listed below.     Follow Up Recommendations CIR    Equipment Recommendations       Recommendations for Other Services Rehab consult     Precautions / Restrictions Precautions Precautions: Fall Precaution Comments: L sided weakness, blurry vision Restrictions Weight Bearing Restrictions: No      Mobility  Bed Mobility Overal bed mobility: Needs Assistance Bed Mobility: Supine to Sit;Sit to Supine     Supine to sit: Mod assist Sit to supine: Min guard   General bed mobility comments: min to manage L LE, Initiation and follow through a  problem. Cues to redirect to task.  Transfers Overall transfer level: Needs assistance Equipment used: Rolling walker (2 wheeled) Transfers: Sit to/from [employer] Sit to Stand: Mod assist Stand pivot transfers: Mod assist       General transfer comment: mod assist to power up into standing with cueing for hand placement and safety, decreased initation and motor planning with decreased awareness to mgmt of L side  Ambulation/Gait Ambulation/Gait assistance: Mod assist Gait Distance (Feet): 80 Feet Assistive device: Rolling walker (2 wheeled) Gait Pattern/deviations: Step-to pattern Gait velocity: decreased Gait velocity interpretation: <1.31 ft/sec, indicative of household ambulator General Gait Details: Gait cadence slow with pt having trouble initiating toe off with follow through labored..  pt needing cues for posture, approp approximation to the RW and to keep focus.  Stability assist  Stairs            Wheelchair Mobility    Modified Rankin (Stroke Patients Only) Modified Rankin (Stroke Patients Only) Modified Rankin: Moderately severe disability     Balance Overall balance assessment: Needs assistance Sitting-balance support: No upper extremity supported;Feet supported Sitting balance-Leahy Scale: Fair     Standing balance support: Bilateral upper extremity supported;During functional activity Standing balance-Leahy Scale: Poor Standing balance comment: reliant on B UE and external support statically                             Pertinent Vitals/Pain Pain Assessment: Faces Faces Pain Scale: Hurts little more Pain Location: headache  Pain Descriptors / Indicators: Discomfort;Constant Pain Intervention(s): Monitored during session    Home Living Family/patient expects  to be discharged to:: Private residence Living Arrangements: Children;Spouse/significant other Available Help at Discharge: Family;Available  PRN/intermittently Type of Home: House Home Access: Stairs to enter Entrance Stairs-Rails: Left Entrance Stairs-Number of Steps: 4 STE  Home Layout: One level Home Equipment: Walker - 2 wheels;Grab bars - toilet;Grab bars - tub/shower      Prior Function Level of Independence: Independent         Comments: pt reports prior to first CVA independent, after previous admission requires some assist but husband works and was using RW for mobility during day independently      Hand Dominance   Dominant Hand: Right    Extremity/Trunk Assessment   Upper Extremity Assessment Upper Extremity Assessment: LUE deficits/detail LUE Deficits / Details: Grossly 3+/5 MMT  LUE Sensation: (to be assessed) LUE Coordination: decreased fine motor;decreased gross motor    Lower Extremity Assessment Lower Extremity Assessment: LLE deficits/detail LLE Deficits / Details: Slow to initiate resistance, hip flexion, hams 3+, quads >3+ LLE Sensation: decreased light touch LLE Coordination: decreased fine motor       Communication   Communication: No difficulties  Cognition Arousal/Alertness: Awake/alert Behavior During Therapy: Flat affect;WFL for tasks assessed/performed Overall Cognitive Status: History of cognitive impairments - at baseline Area of Impairment: Attention;Memory;Following commands;Safety/judgement;Awareness;Problem solving                   Current Attention Level: Sustained Memory: Decreased recall of precautions;Decreased short-term memory Following Commands: Follows one step commands consistently;Follows one step commands with increased time Safety/Judgement: Decreased awareness of safety;Decreased awareness of deficits Awareness: Emergent Problem Solving: Slow processing;Decreased initiation;Difficulty sequencing;Requires verbal cues;Requires tactile cues General Comments: pt with slow responses and limited verbalizations throughout session; patients husband reports  increased difficulty with memory and problem sovling since current admission      General Comments General comments (skin integrity, edema, etc.): VSS throughout session, RN aware of IV site leaking     Exercises     Assessment/Plan    PT Assessment Patient needs continued PT services  PT Problem List Decreased [name];Decreased mobility;Decreased activity tolerance;Decreased balance;Decreased knowledge of use of DME;Impaired sensation;Decreased safety awareness;Decreased knowledge of precautions;Decreased coordination       PT Treatment Interventions DME instruction;Therapeutic activities;Gait training;Therapeutic exercise;Patient/family education;Balance training;Functional mobility training;Stair training;Neuromuscular re-education    PT Goals (Current goals can be found in the Care Plan section)  Acute Rehab PT Goals Patient Stated Goal: to get stronger PT Goal Formulation: With patient Time For Goal Achievement: 03/23/18 Potential to Achieve Goals: Good    Frequency Min 4X/week   Barriers to discharge        Co-evaluation               AM-PAC PT "6 Clicks" Daily Activity  Outcome Measure Difficulty turning over in bed (including adjusting bedclothes, sheets and blankets)?: A Little Difficulty moving from lying on back to sitting on the side of the bed? : Unable Difficulty sitting down on and standing up from a chair with arms (e.g., wheelchair, bedside commode, etc,.)?: Unable Help needed moving to and from a bed to chair (including a wheelchair)?: A Lot Help needed walking in hospital room?: A Lot Help needed climbing 3-5 steps with a railing? : A Lot 6 Click Score: 11    End of Session   Activity Tolerance: Patient tolerated treatment well( mental fatigue/physical fatigue ) Patient left: in bed;with call bell/phone within reach Nurse Communication: Mobility status PT Visit Diagnosis: Other abnormalities of gait and mobility (R26.89);Hemiplegia and  hemiparesis Hemiplegia - Right/Left: Left Hemiplegia - dominant/non-dominant: Non-dominant Hemiplegia - caused by: Cerebral infarction    Time: [id number] PT Time Calculation (min) (ACUTE ONLY): 30 min   Charges:   PT Evaluation $PT Eval Moderate Complexity: 1 Mod PT Treatments $Gait Training: 8-22 mins        03/09/2018  [place] BingKen Angas Isabell, PT Acute Rehabilitation Services [phone]  (pager) [phone]  (office)  Eliseo GumKenneth V Shalese Strahan 03/09/2018, 11:34 AM

## 2018-03-09 NOTE — Progress Notes (Signed)
[facility] Progress Note  Subjective: no new c/o, no change in LUE/ LLE weakness/ numbness.  No SOB or cough.   Vitals:   03/08/18 2300 03/09/18 0100 03/09/18 0300 03/09/18 0800  BP: (!) 159/81 (!) 172/99 (!) 183/96 (!) 167/90  Pulse: 83 81 79 85  Resp: 18 20 20  (!) 22  Temp: 98.2 F (36.8 C) 98.3 F (36.8 C) 98.4 F (36.9 C) 98.7 F (37.1 C)  TempSrc: Oral Oral Oral Oral  SpO2: 94% 93% 94% 93%  Weight:      Height:        Inpatient medications: . aspirin EC  81 mg Oral Daily  . atorvastatin  80 mg Oral QHS  . bisacodyl  10 mg Oral Once  . DULoxetine  60 mg Oral Daily  . insulin aspart  0-20 Units Subcutaneous TID WC  . insulin glargine  30 Units Subcutaneous QHS  . levETIRAcetam  500 mg Oral BID  . QUEtiapine  50 mg Oral QHS   . sodium chloride 75 mL/hr at 03/09/18 0920   acetaminophen **OR** acetaminophen, bisacodyl, ondansetron **OR** ondansetron (ZOFRAN) IV, senna-docusate  Exam: Constitutional: NAD, calm, comfortable Eyes: PERRL, lids and conjunctivae normal ENMT: Mucous membranes are moist. Posterior pharynx clear of any exudate or lesions. Neck: normal, supple, no masses, no thyromegaly Respiratory: clear to auscultation bilaterally, no wheezing, no crackles. Normal respiratory effort. No accessory muscle use.  Cardiovascular: Regular rate and rhythm, no murmurs / rubs / gallops. No extremity edema. 2+ pedal pulses. No carotid bruits.  Abdomen: no tenderness, no masses palpated. No hepatosplenomegaly. Bowel sounds positive.  Musculoskeletal: no clubbing / cyanosis.  Good ROM, no contractures. Normal muscle tone.  Skin: no rashes, lesions, ulcers on limited dermatological examination. Neurologic: CN 2-12 grossly intact. Sensation intact, DTR normal.  +4/5 LLE and LUE weakness. Speech is a little slowed.  Psychiatric: Normal judgment and insight. Alert and oriented x 3. Normal mood.    Presentation Summary: [name] is a 52 y.o. female with medical  history significant of anxiety, chronic kidney disease, type 2 diabetes, fibromyalgia, headache, history of urolithiasis, hyperlipidemia, hypertension, hypothyroidism, insomnia chronic pancreatitis, sleep apnea, recent CVA who is coming [facility] due to recrudescence of recent CVA, which presented with worsening weakness of LLE.  The patient was recently admitted from 03/01/2018-03/02/18 for left-sided weakness, numbness, and blurred vision found to have acute patchy infarcts in the posterior right ACA and posterior ACA/MCA watershed territory.  She had associated new onset seizures and was started on Keppra.  She was transferred to [facility] on 03/02/2018 for TEE.  TEE was unable to be completed.  She was seen by the stroke team and was eventually discharged (on 03/04/2018) on aspirin 81 mg and clopidogrel 75 mg daily (plan for 3 months then switched to clopidogrel alone).  She had some improvement in her left-sided weakness but not back to baseline.  PT/OT evaluations recommended skilled nursing facility on discharged however she declined and was discharged home with home health.  She had reportedly not been taking any of her medications for several months prior to this recent admission.   Around 12:30 p.m. on 03/07/18 she notice worsening left lower extremity weakness with difficulty ambulating.  She had decreased sensation her left arm and left leg.  She has noticed worsening blurry vision.  She denied any associated chest pain, palpitations, dyspnea, nausea, vomiting.  She said she has been able to take obtain and take her medications the last 2 days.  Emer Room:  initial vitals showed BP 146/80, pulse 80, RR 20, temp 97.8 F, SpO2 100% on room air.  Code stroke was called and the CT head without contrast showed known subacute infarct in the right ACA territory and remote right occipital and inferior cerebellar infarcts without intracranial hemorrhage or other acute finding.  Tele  neurology were consulted and recommended MRI head.  MRI head showed slight interval expansion/worsening of right ACA territory infarct compared to a prior from 03/01/2018 with new patchy involvement of the right corpus callosum.  Lab work was notable for creatinine 1.7 (compared to 1.48 on 03/03/2018), AST 57, ALT 61, alkaline phosphatase 259, troponin I < 0.01.  CBC was within normal limits.  Previous CTA reviewed in concerning for significant right MCA disease.  Discussed with Neuro Interventional radiologist Dr. Corliss Skains who recommended transfer to [facility] so patient can undergo diagnostic arteriogram to determine if intervention indicated.  Patient will transfer to [facility] via [facility].  Neuro interventional Radiology needs to be contacted upon patient arrival.  Neuro Hospitals also need to be consulted in the event patient does undergo interventional procedure and would require postprocedure ICU care.   Hospital Problems/ Course:  Acute CVA (cerebrovascular accident) [facility]): hx prior old CVA, readmited w/  CVA extension from prior admit 11/12 - 03/04/18 here.  Posterior right ACA and posterior ACA/MCA watershed territory. Mild LUE and LLE deficits. Hx prior stroke(s).  IR / Dr Titus Dubin to perform arteriogram today.  Pt is stable hemodynamically.  BP's on high side w/ no meds , perrmissive HTN.   - admit to neuro unit/telemetry. - Frequent neuro checks. - Continue aspirin. - Hold Plavix pending procedure. - Allow permissive hypertension. - to go for procedure per Dr Titus Dubin today, keep NPO    Type 2 diabetes mellitus (HCC) Carb modified diet. CBG monitoring with RI SS.    Hypothyroidism Continue levothyroxine. Check TSH as needed,    HTN (hypertension) Allowing permissive hypertension. Monitor blood pressure    Dyslipidemia Continue atorvastatin.    Seizures (HCC) Continue Keppra.    Renal insufficiency - creat last admit was 1.4- 1.5  No lytes done here yet, will get  Cmet this am.  Old creat from 2015 was 1.30. Suspect has CKD. Check UA.    DVT prophylaxis: SQ heparin. Code Status: Full code. Family Communication:  Disposition Plan: Admit to stepdown for IR evaluation.   Consults called: IR (Dr. Brenton Grills) consulted. Consulting neurology also.  Admission status: Inpatient/stepdown.     Vinson Moselle MD [facility] pgr [phone] 03/09/2018, 9:33 AM   Recent Labs  Lab 03/02/18 2149 03/03/18 0934  NA  --  135  K  --  3.7  CL  --  104  CO2  --  24  GLUCOSE  --  301*  BUN  --  19  CREATININE 1.57* 1.48*  CALCIUM  --  8.5*   Recent Labs  Lab 03/03/18 0934  AST 36  ALT 35  ALKPHOS 184*  BILITOT 0.6  PROT 5.8*  ALBUMIN 2.4*   Recent Labs  Lab 03/02/18 2149 03/09/18 0448  WBC 8.7 7.9  NEUTROABS  --  4.4  HGB 13.4 13.6  HCT 41.0 43.5  MCV 85.6 87.9  PLT 329 323   Iron/TIBC/Ferritin/ %Sat No results found for: IRON, TIBC, FERRITIN, IRONPCTSAT

## 2018-03-09 NOTE — Procedures (Signed)
S/P  4 vessel cerebral arteriogram RT radial approach. Findings. 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 3.Severe stenosis of RT MCA  dominant inf division at its origin. 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

## 2018-03-09 NOTE — Progress Notes (Addendum)
Notified Dr. Arlean HoppingSchertz of patients DNR paperwork on chart and patient verbalizing she is a DNR. Also informed him of her headache 10/10 and blurry vision which is unchanged for a few days. Patient given tylenol and sitting in bed with no acute distress speaking with OT.

## 2018-03-09 NOTE — Progress Notes (Signed)
Notified Dr. Arlean HoppingSchertz of not enough blood in lab for total protein and Alkaline phosphate on CMP. Awaiting orders.

## 2018-03-09 NOTE — Evaluation (Signed)
Speech Language Pathology Evaluation Patient Details Name: [name] MRN: [id number] DOB: May 06, 1965 Today's Date: 03/09/2018 Time: [id number] SLP Time Calculation (min) (ACUTE ONLY): 10 min  Problem List:  Patient Active Problem List   Diagnosis Date Noted  . Seizures (HCC) 03/08/2018  . Non compliance w medication regimen   . Dyslipidemia 03/03/2018  . Acute CVA (cerebrovascular accident) (HCC) 03/02/2018  . Type 2 diabetes mellitus (HCC) 03/25/2015  . Hypothyroidism 03/25/2015  . HTN (hypertension) 03/25/2015  . RLS (restless legs syndrome) 03/25/2015  . History of stroke 11/30/2014  . Obesity, morbid, BMI 40.0-49.9 (HCC) 11/30/2014  . Pure hypertriglyceridemia 11/30/2014   Past Medical History:  Past Medical History:  Diagnosis Date  . Anxiety   . Chronic kidney disease   . Diabetes mellitus without complication (HCC)   . Fibromyalgia   . Headache   . History of kidney stones   . Hyperlipemia   . Hypertension   . Hypothyroidism   . Insomnia   . Pancreatitis    chronic  . Sleep apnea   . Stroke (cerebrum) [facility](HCC)    Past Surgical History:  Past Surgical History:  Procedure Laterality Date  . ABDOMINAL HYSTERECTOMY    . CHOLECYSTECTOMY    . ROTATOR CUFF REPAIR Right   . SHOULDER SURGERY Left    HPI:  Jessica Breedingancy Seher is a 52 y.o. female with a history of left-sided weakness that started on Monday.  MRI revealed Right side of ACA/MCA CVA. 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 Pt readmitted on 03/07/18 for worsening left sided weakness.    Assessment / Plan / Recommendation Clinical Impression  Pt presents with very flat affect, she provides simple 1 to 2 word responses to direct question and appears frustrated by questions. Extensive chart review completed with report of baseline cognitive  deficits (specifically history of short term memory deficits). Although pt is able to follow simple directions, she has lengthy processing times which is compounded by decreased selective attention. She is able to recall current medical situation (pending TEE and purpose of TEE). Pt reports independence prior to recent admissions but chart review doesn't support the level of activity that she reports, When further prompted, pt became mildly emotional. Pt appears at baseline. However, given history of cognitive deficits, noncompliance with medication and frustration with ST questions would recommend increased supervision at discharge to help better manage baseline deficits and ensure medication management. ST to sign off acutely.    Additionally, education provided on stroke prevention and s/s of stroke. Pt voiced understanding.     SLP Assessment  SLP Recommendation/Assessment: Patient does not need any further Speech Lanaguage Pathology Services SLP Visit Diagnosis: Cognitive communication deficit (R41.841)    Follow Up Recommendations  None(none for speech, recommend supervision for medication manage)    Frequency and Duration           SLP Evaluation Cognition  Overall Cognitive Status: History of cognitive impairments - at baseline Arousal/Alertness: Awake/alert Orientation Level: Oriented to person;Oriented to place;Oriented to situation;Disoriented to time Attention: Sustained Sustained Attention: Appears intact Memory: Impaired(history of baseline impairments) Memory Impairment: Decreased short term memory Awareness: Appears intact Executive Function: (at baseline per chart review)       Comprehension  Auditory Comprehension Overall Auditory Comprehension: (increased processing time) Yes/No Questions: Within Functional Limits Commands: (Increased processing times noted) Conversation: (very simple responses  to direct questions - no conversation) Interfering Components:  Processing speed EffectiveTechniques: Extra processing time;Repetition Visual Recognition/Discrimination Discrimination: Within Function Limits Reading Comprehension Reading Status: Not tested    Expression Expression Primary Mode of Expression: Verbal Verbal Expression Overall Verbal Expression: Appears within functional limits for tasks assessed Automatic Speech: Name Impairments: Eye contact;Abnormal affect;Monotone Interfering Components: Premorbid deficit Non-Verbal Means of Communication: Not applicable Written Expression Dominant Hand: Right Written Expression: Not tested   Oral / Motor  Oral Motor/Sensory Function Overall Oral Motor/Sensory Function: Within functional limits Motor Speech Overall Motor Speech: Appears within functional limits for tasks assessed Respiration: Within functional limits Phonation: Normal Resonance: Within functional limits Articulation: Within functional limitis Intelligibility: Intelligible Motor Planning: Witnin functional limits Motor Speech Errors: Not applicable   GO                    Nyanna Heideman 03/09/2018, 9:40 AM

## 2018-03-09 NOTE — Progress Notes (Addendum)
Paged Dr. Arlean HoppingSchertz regarding patients headache 7/10. Awaiting response.   18:58 Paged Dr. Corliss Skainseveshwar of patients headache 8/10, HR 106. Orders to try and get ahold of primary team, he is okay with motrin.

## 2018-03-09 NOTE — Evaluation (Signed)
Occupational Therapy Evaluation Patient Details Name: [name] MRN: [id number] DOB: [birth year] Today's Date: 03/09/2018    History of Present Illness Pt is a 52 y.o. female with medical history significant of anxiety, chronic kidney disease, type 2 diabetes, fibromyalgia, headache, history of urolithiasis, hyperlipidemia, hypertension, hypothyroidism, insomnia chronic pancreatitis, sleep apnea, recent CVA who is coming from [facility] due to recrudescence of recent CVA, which presented with worsening weakness of LLE. Admitted 03/01/18 and found to have acute patchy infarcts in the posterior R ACA and posterior ACA/MCA watershed territory, new onset seizures and started on Keppra. Utimately dc'd home on 03/04/18. On 03/07/18 re-admitted due to worsening L LE weakness and blurry vision. MRI shows slight interval expansion/worsening of R ACA territory infarct as well as new patchy invovelment of R corpous callosum. Concern for R MCA disease.  Continued workup.    Clinical Impression   Patient admitted for above and limited by impaired balance, L sided weakness/impaired coordination, decreased activity tolerance, poor initiation and motor planning, blurry vision, impaired cognition, and slight L inattention.  Pt reports since CVA 03/01/18, she was completing mobility with RW and needed some assistance but was able to complete toilet transfers/toielting and mobility with modified independence.  Patient currently requires mod assist for toilet transfers, total assist for toileting, total assist for LB self care, min assist for UB ADLs and mod assist for bed mobility.  Patient will benefit from continued OT services while admitted and agreeable to rehab prior to dc home. I have discussed the patient's current level of function related to ADLs and mobility with the patient and spouse. They acknowledge understanding of this and do not feel the patient would be able to have their care needs met at home.   They are interested in post-acute rehab in an inpatient setting. Recommend CIR level at this time.  Will continue to follow.    Follow Up Recommendations  CIR    Equipment Recommendations  Other (comment)(TBD )    Recommendations for Other Services Rehab consult     Precautions / Restrictions Precautions Precautions: Fall Precaution Comments: L sided weakness, blurry vision Restrictions Weight Bearing Restrictions: No      Mobility Bed Mobility Overal bed mobility: Needs Assistance Bed Mobility: Supine to Sit;Sit to Supine     Supine to sit: Mod assist Sit to supine: Min guard   General bed mobility comments: mod assist to manage L LE OOB, trunk support and scoot forward   Transfers Overall transfer level: Needs assistance Equipment used: Rolling walker (2 wheeled) Transfers: Sit to/from [employer] Sit to Stand: Mod assist Stand pivot transfers: Mod assist       General transfer comment: mod assist to power up into standing with cueing for hand placement and safety, decreased initation and motor planning with decreased awareness to mgmt of L side    Balance Overall balance assessment: Needs assistance Sitting-balance support: No upper extremity supported;Feet supported Sitting balance-Leahy Scale: Fair     Standing balance support: Bilateral upper extremity supported;During functional activity Standing balance-Leahy Scale: Poor Standing balance comment: reliant on B UE and external support statically                           ADL either performed or assessed with clinical judgement   ADL Overall ADL's : Needs assistance/impaired     Grooming: Supervision/safety;Set up;Sitting   Upper Body Bathing: Set up;Sitting   Lower Body Bathing: Maximal assistance;Sit to/from stand  Upper Body Dressing : Minimal assistance;Sitting   Lower Body Dressing: Total assistance;Sit to/from stand   Toilet Transfer: Moderate  assistance;BSC;RW;Cueing for sequencing;Cueing for safety;Stand-pivot   Toileting- Clothing Manipulation and Hygiene: Moderate assistance;+2 for safety/equipment;Sit to/from stand;+2 for physical assistance Toileting - Clothing Manipulation Details (indicate cue type and reason): +2 to manage clothing while therapist mainatins balance     Functional mobility during ADLs: Moderate assistance;Rolling walker;Cueing for safety;Cueing for sequencing General ADL Comments: Pt limited by L inattention, L sided weakness, impaired cognition, decreased initation and motor planning.      Vision Baseline Vision/History: Wears glasses Wears Glasses: At all times Patient Visual Report: Blurring of vision(denies visual changes initally ) Vision Assessment?: Yes Tracking/Visual Pursuits: Impaired - to be further tested in functional context Additional Comments: pt unable to attend to tracking testing     Perception Perception Perception Tested?: Yes Perception Deficits: Inattention/neglect Inattention/Neglect: Does not attend to left side of body Comments: slight inattention   Praxis      Pertinent Vitals/Pain Pain Assessment: Faces Faces Pain Scale: Hurts little more Pain Location: headache  Pain Descriptors / Indicators: Discomfort;Constant Pain Intervention(s): Monitored during session(RN aware, pt only mentioned to therapist after RN questioned)     Hand Dominance Right   Extremity/Trunk Assessment Upper Extremity Assessment Upper Extremity Assessment: LUE deficits/detail LUE Deficits / Details: Grossly 3+/5 MMT  LUE Sensation: (to be assessed) LUE Coordination: decreased fine motor;decreased gross motor   Lower Extremity Assessment Lower Extremity Assessment: Defer to PT evaluation       Communication Communication Communication: No difficulties   Cognition Arousal/Alertness: Awake/alert Behavior During Therapy: Flat affect;WFL for tasks assessed/performed Overall Cognitive  Status: History of cognitive impairments - at baseline Area of Impairment: Attention;Memory;Following commands;Safety/judgement;Awareness;Problem solving                   Current Attention Level: Sustained Memory: Decreased recall of precautions;Decreased short-term memory Following Commands: Follows one step commands consistently;Follows one step commands with increased time Safety/Judgement: Decreased awareness of safety;Decreased awareness of deficits Awareness: Emergent Problem Solving: Slow processing;Decreased initiation;Difficulty sequencing;Requires verbal cues;Requires tactile cues General Comments: pt with slow responses and limited verbalizations throughout session; patients husband reports increased difficulty with memory and problem sovling since current admission   General Comments  VSS throughout session, RN aware of IV site leaking     Exercises     Shoulder Instructions      Home Living Family/patient expects to be discharged to:: Private residence Living Arrangements: Children;Spouse/significant other Available Help at Discharge: Family;Available PRN/intermittently Type of Home: House Home Access: Stairs to enter [employer] of Steps: 4 STE  Entrance Stairs-Rails: Left Home Layout: One level     Bathroom Shower/Tub: Walk-in Hydrologist: Standard     Home Equipment: Environmental consultant - 2 wheels;Grab bars - toilet;Grab bars - tub/shower      Lives With: Spouse    Prior Functioning/Environment Level of Independence: Independent        Comments: pt reports prior to first CVA independent, after previous admission requires some assist but husband works and was using RW for mobility during day independently         OT Problem List: Decreased strength;Decreased knowledge of use of DME or AE;Impaired balance (sitting and/or standing);Decreased knowledge of precautions;Obesity;Decreased activity tolerance;Impaired  vision/perception;Decreased coordination;Decreased cognition;Decreased safety awareness;Pain;Impaired UE functional use      OT Treatment/Interventions: Self-care/ADL training;Therapeutic activities;Neuromuscular education;Patient/family education;Cognitive remediation/compensation;Balance training;Energy conservation;DME and/or AE instruction;Visual/perceptual remediation/compensation    OT Goals(Current goals  can be found in the care plan section) Acute Rehab OT Goals Patient Stated Goal: to get stronger OT Goal Formulation: With patient Time For Goal Achievement: 03/23/18 Potential to Achieve Goals: Good  OT Frequency: Min 3X/week   Barriers to D/C:            Co-evaluation              AM-PAC PT "6 Clicks" Daily Activity     Outcome Measure Help from another person eating meals?: A Little Help from another person taking care of personal grooming?: A Little Help from another person toileting, which includes using toliet, bedpan, or urinal?: Total Help from another person bathing (including washing, rinsing, drying)?: A Lot Help from another person to put on and taking off regular upper body clothing?: A Little Help from another person to put on and taking off regular lower body clothing?: Total 6 Click Score: 13   End of Session Equipment Utilized During Treatment: Rolling walker;Gait belt Nurse Communication: Mobility status  Activity Tolerance: Patient tolerated treatment well Patient left: in bed;with call bell/phone within reach;with bed alarm set;with family/visitor present  OT Visit Diagnosis: Unsteadiness on feet (R26.81);Other abnormalities of gait and mobility (R26.89);Muscle weakness (generalized) (M62.81);History of falling (Z91.81);Cognitive communication deficit (R41.841);Other symptoms and signs involving cognitive function;Hemiplegia and hemiparesis Symptoms and signs involving cognitive functions: Cerebral infarction Hemiplegia - Right/Left:  Left Hemiplegia - dominant/non-dominant: Non-Dominant Hemiplegia - caused by: Cerebral infarction                Time: 7034-0352 OT Time Calculation (min): 36 min Charges:  OT General Charges $OT Visit: 1 Visit OT Evaluation $OT Eval Moderate Complexity: 1 Mod OT Treatments $Self Care/Home Management : 8-22 mins  Delight Stare, OT Acute Rehabilitation Services Pager [phone] Office [phone]   Delight Stare 03/09/2018, 10:27 AM

## 2018-03-09 NOTE — Sedation Documentation (Signed)
13 cc of air was introduced to the right radial TR band by Radiologist. Sheath was pulled at 1445

## 2018-03-10 ENCOUNTER — Inpatient Hospital Stay (HOSPITAL_COMMUNITY): Payer: 59

## 2018-03-10 DIAGNOSIS — I63321 Cerebral infarction due to thrombosis of right anterior cerebral artery: Secondary | ICD-10-CM

## 2018-03-10 LAB — GLUCOSE, CAPILLARY
GLUCOSE-CAPILLARY: 202 mg/dL — AB (ref 70–99)
Glucose-Capillary: 143 mg/dL — ABNORMAL HIGH (ref 70–99)
Glucose-Capillary: 195 mg/dL — ABNORMAL HIGH (ref 70–99)
Glucose-Capillary: 234 mg/dL — ABNORMAL HIGH (ref 70–99)

## 2018-03-10 LAB — URINALYSIS, ROUTINE W REFLEX MICROSCOPIC
Bilirubin Urine: NEGATIVE
Glucose, UA: >=500 mg/dL — AB
Hgb urine dipstick: NEGATIVE
Ketones, ur: NEGATIVE mg/dL
LEUKOCYTES UA: NEGATIVE
Nitrite: NEGATIVE
SPECIFIC GRAVITY, URINE: 1.015 (ref 1.005–1.030)
pH: 8 (ref 5.0–8.0)

## 2018-03-10 LAB — CBC
HCT: 41.9 % (ref 36.0–46.0)
Hemoglobin: 13.8 g/dL (ref 12.0–15.0)
MCH: 28.5 pg (ref 26.0–34.0)
MCHC: 32.9 g/dL (ref 30.0–36.0)
MCV: 86.6 fL (ref 80.0–100.0)
NRBC: 0 % (ref 0.0–0.2)
PLATELETS: 262 10*3/uL (ref 150–400)
RBC: 4.84 MIL/uL (ref 3.87–5.11)
RDW: 12.8 % (ref 11.5–15.5)
WBC: 8.9 10*3/uL (ref 4.0–10.5)

## 2018-03-10 LAB — BASIC METABOLIC PANEL
ANION GAP: 10 (ref 5–15)
BUN: 20 mg/dL (ref 6–20)
CALCIUM: 8.7 mg/dL — AB (ref 8.9–10.3)
CHLORIDE: 108 mmol/L (ref 98–111)
CO2: 19 mmol/L — AB (ref 22–32)
Creatinine, Ser: 1.67 mg/dL — ABNORMAL HIGH (ref 0.44–1.00)
GFR calc non Af Amer: 35 mL/min — ABNORMAL LOW (ref >60)
GFR, EST AFRICAN AMERICAN: 40 mL/min — AB (ref >60)
Glucose, Bld: 155 mg/dL — ABNORMAL HIGH (ref 70–99)
Potassium: 4.1 mmol/L (ref 3.5–5.1)
SODIUM: 137 mmol/L (ref 135–145)

## 2018-03-10 LAB — SODIUM, URINE, RANDOM: SODIUM UR: 104 mmol/L

## 2018-03-10 LAB — CREATININE, URINE, RANDOM: Creatinine, Urine: 70.87 mg/dL

## 2018-03-10 NOTE — Progress Notes (Signed)
[facility] Progress Note  Subjective: no new c/o, no change in LUE/ LLE weakness/ numbness.  No SOB or cough.   Vitals:   03/09/18 2326 03/10/18 0359 03/10/18 0813 03/10/18 1200  BP: (!) 165/102 (!) 171/91 (!) 163/97 (!) 168/91  Pulse: (!) 106 94 87 96  Resp: (!) 23 18 20 18   Temp: 98 F (36.7 C) 98.7 F (37.1 C) 98 F (36.7 C) 98.2 F (36.8 C)  TempSrc: Oral Oral Oral Oral  SpO2: 92% 93% 96% 96%  Weight:      Height:        Inpatient medications: . aspirin EC  81 mg Oral Daily  . atorvastatin  80 mg Oral QHS  . bisacodyl  10 mg Oral Once  . DULoxetine  60 mg Oral Daily  . insulin aspart  0-20 Units Subcutaneous TID WC  . insulin glargine  30 Units Subcutaneous QHS  . levETIRAcetam  500 mg Oral BID  . QUEtiapine  50 mg Oral QHS   . sodium chloride 75 mL/hr at 03/10/18 0411   acetaminophen **OR** acetaminophen, bisacodyl, ondansetron **OR** ondansetron (ZOFRAN) IV, senna-docusate  Exam: Constitutional: NAD, calm, comfortable Eyes: PERRL, lids and conjunctivae normal ENMT: Mucous membranes are moist. Posterior pharynx clear of any exudate or lesions. Neck: normal, supple, no masses, no thyromegaly Respiratory: clear to auscultation bilaterally, no wheezing, no crackles. Normal respiratory effort. No accessory muscle use.  Cardiovascular: Regular rate and rhythm, no murmurs / rubs / gallops. No extremity edema. 2+ pedal pulses. No carotid bruits.  Abdomen: no tenderness, no masses palpated. No hepatosplenomegaly. Bowel sounds positive.  Musculoskeletal: no clubbing / cyanosis.  Good ROM, no contractures. Normal muscle tone.  Skin: no rashes, lesions, ulcers on limited dermatological examination. Neurologic: CN 2-12 grossly intact. Sensation intact, DTR normal.  +4/5 LLE and LUE weakness. Speech is a little slowed.  Psychiatric: Normal judgment and insight. Alert and oriented x 3. Normal mood.    Presentation Summary: [name] is a 52 y.o. female with medical  history significant of anxiety, chronic kidney disease, type 2 diabetes, fibromyalgia, headache, history of urolithiasis, hyperlipidemia, hypertension, hypothyroidism, insomnia chronic pancreatitis, sleep apnea, recent CVA who is coming from [facility] due to recrudescence of recent CVA, which presented with worsening weakness of LLE.  The patient was recently admitted from 03/01/2018-03/02/18 for left-sided weakness, numbness, and blurred vision found to have acute patchy infarcts in the posterior right ACA and posterior ACA/MCA watershed territory.  She had associated new onset seizures and was started on Keppra.  She was transferred to [facility] on 03/02/2018 for TEE.  TEE was unable to be completed.  She was seen by the stroke team and was eventually discharged (on 03/04/2018) on aspirin 81 mg and clopidogrel 75 mg daily (plan for 3 months then switched to clopidogrel alone).  She had some improvement in her left-sided weakness but not back to baseline.  PT/OT evaluations recommended skilled nursing facility on discharged however she declined and was discharged home with home health.  She had reportedly not been taking any of her medications for several months prior to this recent admission.   Around 12:30 p.m. on 03/07/18 she notice worsening left lower extremity weakness with difficulty ambulating.  She had decreased sensation her left arm and left leg.  She has noticed worsening blurry vision.  She denied any associated chest pain, palpitations, dyspnea, nausea, vomiting.  She said she has been able to take obtain and take her medications the last 2  days.  Emer Room:  initial vitals showed BP 146/80, pulse 80, RR 20, temp 97.8 F, SpO2 100% on room air.  Code stroke was called and the CT head without contrast showed known subacute infarct in the right ACA territory and remote right occipital and inferior cerebellar infarcts without intracranial hemorrhage or other acute finding.  Tele  neurology were consulted and recommended MRI head.  MRI head showed slight interval expansion/worsening of right ACA territory infarct compared to a prior from 03/01/2018 with new patchy involvement of the right corpus callosum.  Lab work was notable for creatinine 1.7 (compared to 1.48 on 03/03/2018), AST 57, ALT 61, alkaline phosphatase 259, troponin I < 0.01.  CBC was within normal limits.  Previous CTA reviewed in concerning for significant right MCA disease.  Discussed with Neuro Interventional radiologist Dr. Corliss Skainseveshwar who recommended [facility] so patient can undergo diagnostic arteriogram to determine if intervention indicated.  Patient will [facility].  Neuro interventional Radiology needs to be contacted upon patient arrival.  Neuro Hospitals also need to be consulted in the event patient does undergo interventional procedure and would require postprocedure ICU care.   Hospital Problems/ Course:  Acute CVA (cerebrovascular accident) [facility](HCC): hx prior old CVA's, admitted 11/12 - 03/04/18 here w/ posterior right ACA and posterior ACA/MCA watershed territory CVA's by imaging and  Mild LUE and LLE deficits. DC'd to home 11/15. Represented to [facility] ED on 11/19 w/ worsening of L sided weakness. CT and MRI results below, + small evolution of prior CVA on MRI.  They discussed w/ Dr Titus Dubinevashwar who rec'd transfer to [facility] for arteriogram. Arteriogram was done here 11/20 results below. No intervention.  Per IR continue ASA and Plavix.  - cont ASA/ Plavix - CIR consulting for poss rehab admission, awaiting insurance reply - pt clinically stable, working w PT    Type 2 diabetes mellitus (HCC) Carb modified diet. CBG monitoring with RI SS.    Hypothyroidism Continue levothyroxine. Check TSH as needed,    HTN (hypertension) Allowing permissive hypertension. Monitor blood pressure    Dyslipidemia Continue atorvastatin.    Seizures (HCC) Continue Keppra.     Renal insufficiency - creat last admit was 1.4- 1.5  Creat 1.6 here, stable. Old creat from 2015 was 1.30. Suspect has CKD III. Check UA and renal US.    DNR - pt's request   DVT prophylaxis: SQ heparin. Code Status: DNR, pt request Family Communication: husband at bedside Disposition Plan: CIR accepted pt , awaiting insurance Consults called: IR (Dr. Brenton Grillsevishar) consulted Admission status: Inpatient, transfer to telemetry     Vinson Moselleob Jerriah Ines MD [facility] pgr [phone] 03/10/2018, 12:40 PM  Imaging: [facility] 03/08/18 > CT head without contrast showed known subacute infarct in the right ACA territory and remote right occipital and inferior cerebellar infarcts without intracranial hemorrhage or other acute findings.  MRI head showed slight interval expansion/worsening of right ACA territory infarct compared to a prior from 03/01/2018 with new patchy involvement of the right corpus callosum.   Procedures: S/P  4 vessel cerebral arteriogram 11/20 Dr Corliss Skainseveshwar RT radial approach. Findings. 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 3.Severe stenosis of RT MCA  dominant inf division at its origin. 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

## 2018-03-10 NOTE — Progress Notes (Signed)
Patient ID: [name], female   DOB: 09/20/1965, 52 y.o.   MRN: [id number]   11/20: S/P  4 vessel cerebral arteriogram RT radial approach. Findings. 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 3.Severe stenosis of RT MCA  dominant inf division at its origin. 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

## 2018-03-10 NOTE — Progress Notes (Signed)
Inpatient Rehabilitation-Admissions Coordinator    Met with patient at the bedside to discuss team's recommendation for inpatient rehabilitation. Shared booklets, expectations while in CIR, expected length of stay, and anticipated functional level at DC. Pt wants to pursue CIR at this time. With permission, AC called pt's spouse to confirm social support and plan to pursue CIR; husband in agreement for CIR and reports that between him, his grown children, and friends, she will have 24/7 A if needed.   AC has begun insurance authorization process for possible admit.   Please call if questions.   Jhonnie Garner, OTR/L  Rehab Admissions Coordinator  [phone] 03/10/2018 3:32 PM

## 2018-03-10 NOTE — Progress Notes (Signed)
Physical Therapy Treatment Patient Details Name: [name] MRN: [id number] DOB: Sep 02, 1965 Today's Date: 03/10/2018    History of Present Illness Pt is a 52 y.o. female with recent admission for ACA/MCA stroke (admitted 11/13-11/15/19), now admitted 03/08/18 with worsening LLE weakness and blurry vision. MRI showed slight interval expansion/worsening of R ACA territory infarct and new patchy involvement of R corpus callosum. CTA concerning for R MCA disease. 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 Neurology workup ongoing. PMH includes HTN, HLD, CKD, DM2, fibromyalgia, OSA, anxiety.   PT Comments    Pt progressing with mobility and motivated to participate. Today's session focused on standing balance; pt with significant L-lateral lean requiring min-maxA+2 to maintain balance, shift weight to R-side and initiate steps. Poor forward progression of L foot, at times requiring maxA to assist weight shift and LLE stepping. Pt demonstrates decreased initiation and poor motor planning, exacerbated with fatigue. Continue to recommend intensive CIR-level therapies to maximize functional mobility and return to independent PLOF. Pt's husband present throughout and supportive.   Follow Up Recommendations  CIR;Supervision for mobility/OOB     Equipment Recommendations  (TBD next venue)    Recommendations for Other Services Rehab consult     Precautions / Restrictions Precautions Precautions: Fall Precaution Comments: L sided weakness, blurry vision Restrictions Weight Bearing Restrictions: No    Mobility  Bed Mobility Overal bed mobility: Needs Assistance Bed Mobility: Sit to Supine       Sit to supine: Min assist   General bed mobility comments: MinA to manage LEs and pad placement  Transfers Overall transfer level: Needs assistance Equipment used: 1 person hand held assist;2 person hand held assist Transfers: Sit  to/from Stand Sit to Stand: Mod assist;+2 safety/equipment         General transfer comment: ModA+2 to assist trunk elevation and maintain balance; pt reaching for RUE support to keep balance. Decreased initiation and motor planning requiring cues  Ambulation/Gait Ambulation/Gait assistance: Mod assist;Max assist;+2 physical assistance;+2 safety/equipment Gait Distance (Feet): 20 Feet Assistive device: 1 person hand held assist;2 person hand held assist Gait Pattern/deviations: Step-to pattern;Decreased step length - left;Decreased dorsiflexion - left;Decreased weight shift to right;Scissoring;Narrow base of support Gait velocity: decreased Gait velocity interpretation: <1.31 ft/sec, indicative of household ambulator General Gait Details: Prolonged time spent standing at EOB working on standing and dynamic balance prior to walking. Amb 10' forwards and 10' backwards, requiring mod-maxA+2 and bilateral HHA to maintain balance; pt did better initiating steps with LLE after performing partial squat. Multimodal cues for sequencing throughout. Difficulty taking complete step with LLE, at times requiring maxA to assist step. With fatigue, began to shuffle bilateral feet. L lateral lean   Stairs             Wheelchair Mobility    Modified Rankin (Stroke Patients Only) Modified Rankin (Stroke Patients Only) Pre-Morbid Rankin Score: No symptoms Modified Rankin: Moderately severe disability     Balance Overall balance assessment: Needs assistance Sitting-balance support: No upper extremity supported;Feet supported Sitting balance-Leahy Scale: Fair     Standing balance support: Bilateral upper extremity supported;During functional activity Standing balance-Leahy Scale: Poor Standing balance comment: Reliant on UE support and external assist for static and dynamic standing                            Cognition Arousal/Alertness: Awake/alert Behavior During Therapy: WFL  for tasks assessed/performed;Flat affect Overall Cognitive  Status: Impaired/Different from baseline Area of Impairment: Attention;Following commands;Safety/judgement;Awareness;Problem solving                   Current Attention Level: Sustained   Following Commands: Follows one step commands with increased time Safety/Judgement: Decreased awareness of safety;Decreased awareness of deficits Awareness: Emergent Problem Solving: Slow processing;Decreased initiation;Difficulty sequencing;Requires verbal cues;Requires tactile cues General Comments: Slow responses and limited verbalizations, especially when focused on single task. Required frequent, multimodal cues for completing tasks with L-side. Very pleasant and willing to participate      Exercises      General Comments General comments (skin integrity, edema, etc.): Husband present and supportive. 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 PT Goal Formulation: With patient Time For Goal Achievement: 03/23/18 Potential to Achieve Goals: Good Progress towards PT goals: Progressing toward goals    Frequency    Min 4X/week      PT Plan Current plan remains appropriate    Co-evaluation PT/OT/SLP Co-Evaluation/Treatment: Yes Reason for Co-Treatment: Complexity of the patient's impairments (multi-system involvement);For patient/therapist safety;To address functional/ADL transfers PT goals addressed during session: Mobility/safety with mobility;Balance        AM-PAC PT "6 Clicks" Daily Activity  Outcome Measure  Difficulty turning over in bed (including adjusting bedclothes, sheets and blankets)?: A Little Difficulty moving from lying on back  to sitting on the side of the bed? : Unable Difficulty sitting down on and standing up from a chair with arms (e.g., wheelchair, bedside commode, etc,.)?: Unable Help needed moving to and from a bed to chair (including a wheelchair)?: A Lot Help needed walking in hospital room?: A Lot Help needed climbing 3-5 steps with a railing? : A Lot 6 Click Score: 11    End of Session Equipment Utilized During Treatment: Gait belt Activity Tolerance: Patient tolerated treatment well Patient left: in bed;with call bell/phone within reach;with bed alarm set;with family/visitor present Nurse Communication: Mobility status PT Visit Diagnosis: Other abnormalities of gait and mobility (R26.89);Hemiplegia and hemiparesis Hemiplegia - Right/Left: Left Hemiplegia - dominant/non-dominant: Non-dominant Hemiplegia - caused by: Cerebral infarction     Time: 1300-1326 PT Time Calculation (min) (ACUTE ONLY): 26 min  Charges:  $Neuromuscular Re-education: 8-22 mins                    [name], PT, DPT Acute Rehabilitation Services  Pager [phone] Office [phone]  [name] 03/10/2018, 3:06 PM

## 2018-03-10 NOTE — H&P (Signed)
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 She was discharged to home with home health services after declining skilled nursing facility ambulating minimal assistance 80 feet with a rolling walker. She lives with her husband who works during the day. Patient presented [facility] 03/08/2018 with increasing left lower extremity weakness and blurred vision. Cranial CT scan completed in the ED showing subacute infarction right ACA territory remote right occipital and inferior cerebellar infarct without intracranial hemorrhage. MRI showed slight interval expansion worsening of the right ACA territory infarction as compared to previous studies. 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 Neurology follow-up currently planned is to continue aspirin and Plavix therapy and follow-up 3 months with interventional radiology for repeat arteriogram. Therapy evaluations completed with recommendations of physical medicine rehabilitation consult. Patient was admitted for a comprehensive rehabilitation program.  Review of Systems  Constitutional:  Negative for chills and fever.  HENT: Negative for hearing loss.   Eyes: Positive for blurred vision.  Respiratory: Negative for cough and shortness of breath.   Cardiovascular: Positive for leg swelling.  Gastrointestinal: Positive for constipation. Negative for nausea and vomiting.  Genitourinary: Negative for dysuria, flank pain and hematuria.  Musculoskeletal: Positive for joint pain and myalgias.  Skin: Negative for rash.  Neurological: Positive for focal weakness and headaches.  Psychiatric/Behavioral: The patient has insomnia.        Anxiety  All other systems reviewed and are negative.  Past Medical History:  Diagnosis Date  . Anxiety   . Chronic kidney disease   . Diabetes mellitus without complication ([place])   . Fibromyalgia   . Headache   . History of kidney stones   . Hyperlipemia   . Hypertension   . Hypothyroidism   . Insomnia   . Pancreatitis    chronic  . Sleep apnea   . Stroke (cerebrum) [facility])    Past Surgical History:  Procedure Laterality Date  . ABDOMINAL HYSTERECTOMY    . CHOLECYSTECTOMY    . ROTATOR CUFF REPAIR Right   . SHOULDER SURGERY Left    Family History  Problem Relation Age of Onset  . Diabetes Neg Hx    Social History:  reports that she has never smoked. She has never used smokeless tobacco. She reports that she does not drink alcohol or use drugs. Allergies: No Known Allergies Medications Prior to Admission  Medication Sig Dispense Refill  . acetaminophen (TYLENOL) 500 MG tablet Take 1,000 mg by mouth every 6 (six) hours as needed for mild pain or headache.    Marland Kitchen amLODipine (NORVASC) 10 MG tablet Take 10 mg by mouth daily.     Marland Kitchen aspirin EC 81 MG EC tablet Take 1 tablet (81 mg total) by mouth daily. 30 tablet 0  . atorvastatin (  LIPITOR) 80 MG tablet Take 1 tablet (80 mg total) by mouth at bedtime. 30 tablet 0  . carvedilol (COREG) 25 MG tablet Take 25 mg by mouth 2 (two) times daily with a meal.     . chlorthalidone (HYGROTON) 25 MG  tablet Take 25 mg by mouth daily.    . clopidogrel (PLAVIX) 75 MG tablet Take 1 tablet (75 mg total) by mouth daily. 30 tablet 0  . DULoxetine (CYMBALTA) 60 MG capsule Take 60 mg by mouth daily.    . hydrALAZINE (APRESOLINE) 50 MG tablet Take 50 mg by mouth 3 (three) times daily.    . insulin aspart (NOVOLOG FLEXPEN) 100 UNIT/ML FlexPen Inject 1-15 Units into the skin 3 (three) times daily with meals. CBG 121 - 150: 2 units  CBG 151 - 200: 3 units  CBG 201 - 250: 5 units  CBG 251 - 300: 8 units  CBG 301 - 350: 11 units  CBG 351 - 400: 15 units 15 mL 11  . insulin aspart (NOVOLOG) 100 UNIT/ML injection Inject 5 Units into the skin 3 (three) times daily with meals. 10 mL 11  . Insulin Glargine (LANTUS SOLOSTAR) 100 UNIT/ML Solostar Pen Inject 30 Units into the skin at bedtime. 15 mL 11  . Insulin Pen Needle 31G X 4 MM MISC 1 each by Does not apply route 4 (four) times daily -  before meals and at bedtime. 120 each 0  . levETIRAcetam (KEPPRA) 500 MG tablet Take 1 tablet (500 mg total) by mouth 2 (two) times daily. 60 tablet 0  . lisinopril (PRINIVIL,ZESTRIL) 40 MG tablet Take 40 mg by mouth daily.    . QUEtiapine (SEROQUEL) 50 MG tablet Take 50 mg by mouth at bedtime.    . senna-docusate (SENOKOT-S) 8.6-50 MG tablet Take 1 tablet by mouth at bedtime as needed for moderate constipation. 30 tablet 0    Drug Regimen Review Drug regimen was reviewed and remains appropriate with no significant issues identified  Home: Home Living Family/patient expects to be discharged to:: Private residence Living Arrangements: Children, Spouse/significant other Available Help at Discharge: Family, Available PRN/intermittently Type of Home: House Home Access: Stairs to enter [employer] of Steps: 4 STE  Entrance Stairs-Rails: Left Home Layout: One level Bathroom Shower/Tub: Gaffer, Curtain Biochemist, clinical: Standard Home Equipment: Environmental consultant - 2 wheels, Grab bars - toilet, Grab bars -  tub/shower  Lives With: Spouse   Functional History: Prior Function Level of Independence: Independent Comments: pt reports prior to first CVA independent, after previous admission requires some assist but husband works and was using RW for mobility during day independently   Functional Status:  Mobility: Bed Mobility Overal bed mobility: Needs Assistance Bed Mobility: Supine to Sit, Sit to Supine Supine to sit: Mod assist Sit to supine: Min guard General bed mobility comments: min to manage L LE, Initiation and follow through a problem. Cues to redirect to task. Transfers Overall transfer level: Needs assistance Equipment used: Rolling walker (2 wheeled) Transfers: Sit to/from Stand, [employer] Transfers Sit to Stand: Mod assist Stand pivot transfers: Mod assist General transfer comment: mod assist to power up into standing with cueing for hand placement and safety, decreased initation and motor planning with decreased awareness to mgmt of L side Ambulation/Gait Ambulation/Gait assistance: Mod assist Gait Distance (Feet): 80 Feet Assistive device: Rolling walker (2 wheeled) Gait Pattern/deviations: Step-to pattern General Gait Details: Gait cadence slow with pt having trouble initiating toe off with follow through labored..  pt needing  cues for posture, approp approximation to the RW and to keep focus.  Stability assist Gait velocity: decreased Gait velocity interpretation: <1.31 ft/sec, indicative of household ambulator    ADL: ADL Overall ADL's : Needs assistance/impaired Grooming: Supervision/safety, Set up, Sitting Upper Body Bathing: Set up, Sitting Lower Body Bathing: Maximal assistance, Sit to/from stand Upper Body Dressing : Minimal assistance, Sitting Lower Body Dressing: Total assistance, Sit to/from stand Toilet Transfer: Moderate assistance, BSC, RW, Cueing for sequencing, Cueing for safety, Stand-pivot Toileting- Clothing Manipulation and Hygiene: Moderate  assistance, +2 for safety/equipment, Sit to/from stand, +2 for physical assistance Toileting - Clothing Manipulation Details (indicate cue type and reason): +2 to manage clothing while therapist mainatins balance Functional mobility during ADLs: Moderate assistance, Rolling walker, Cueing for safety, Cueing for sequencing General ADL Comments: Pt limited by L inattention, L sided weakness, impaired cognition, decreased initation and motor planning.   Cognition: Cognition Overall Cognitive Status: History of cognitive impairments - at baseline Arousal/Alertness: Awake/alert Orientation Level: Oriented X4 Attention: Sustained Sustained Attention: Appears intact Memory: Impaired(history of baseline impairments) Memory Impairment: Decreased short term memory Awareness: Appears intact Executive Function: (at baseline per chart review) Cognition Arousal/Alertness: Awake/alert Behavior During Therapy: Flat affect, WFL for tasks assessed/performed Overall Cognitive Status: History of cognitive impairments - at baseline Area of Impairment: Attention, Memory, Following commands, Safety/judgement, Awareness, Problem solving Current Attention Level: Sustained Memory: Decreased recall of precautions, Decreased short-term memory Following Commands: Follows one step commands consistently, Follows one step commands with increased time Safety/Judgement: Decreased awareness of safety, Decreased awareness of deficits Awareness: Emergent Problem Solving: Slow processing, Decreased initiation, Difficulty sequencing, Requires verbal cues, Requires tactile cues General Comments: pt with slow responses and limited verbalizations throughout session; patients husband reports increased difficulty with memory and problem sovling since current admission  Physical Exam: Blood pressure (!) 163/97, pulse 87, temperature 98 F (36.7 C), temperature source Oral, resp. rate 20, height 5' 5"  (1.651 m), weight 102.4 kg,  SpO2 96 %. Physical Exam  Constitutional:  obese  HENT:  Head: Normocephalic and atraumatic.  Eyes: Pupils are equal, round, and reactive to light. EOM are normal.  Neck: Normal range of motion. No tracheal deviation present. No thyromegaly present.  Cardiovascular: Normal rate and regular rhythm. Exam reveals no friction rub.  No murmur heard. Respiratory: Effort normal. No respiratory distress. She has no wheezes. She has no rales.  GI: Soft. She exhibits no distension. There is no tenderness.  Musculoskeletal:  Pt alert, follows simple commands. Slow to process and speak however. Able to answer basic biographical questions. Oriented to person, place, reason she's here. Mild left inattention, no field cuts.  RUE and RLE 4 to 4+/5 prox to distal. LUE: 3+ to 4/5 deltoid, biceps, triceps, wrist and hand. LLE: 4/5 HF, KE and ADF/PF. DTR's 1+ throughout, no resting tone.  Neurological: She is alert.  Flat but appropriate. Slow to process with word finding difficulty  Skin: Skin is warm and dry.  Psychiatric:  Anxious but cooperative    Results for orders placed or performed during the hospital encounter of 03/08/18 (from the past 48 hour(s))  Glucose, capillary     Status: Abnormal   Collection Time: 03/08/18  3:19 PM  Result Value Ref Range   Glucose-Capillary 238 (H) 70 - 99 mg/dL   Comment 1 Notify RN    Comment 2 Document in Chart   Protime-INR     Status: None   Collection Time: 03/08/18  4:11 PM  Result Value Ref Range  Prothrombin Time 12.3 11.4 - 15.2 seconds   INR 0.92     Comment: Performed at [facility], [place] [address], [place], [place] [address]  Glucose, capillary     Status: Abnormal   Collection Time: 03/08/18  5:12 PM  Result Value Ref Range   Glucose-Capillary 182 (H) 70 - 99 mg/dL   Comment 1 Notify RN    Comment 2 Document in Chart   CBC WITH DIFFERENTIAL     Status: None   Collection Time: 03/09/18  4:48 AM  Result Value Ref Range   WBC 7.9 4.0 -  10.5 K/uL   RBC 4.95 3.87 - 5.11 MIL/uL   Hemoglobin 13.6 12.0 - 15.0 g/dL   HCT 43.5 36.0 - 46.0 %   MCV 87.9 80.0 - 100.0 fL   MCH 27.5 26.0 - 34.0 pg   MCHC 31.3 30.0 - 36.0 g/dL   RDW 12.8 11.5 - 15.5 %   Platelets 323 150 - 400 K/uL   nRBC 0.0 0.0 - 0.2 %   Neutrophils Relative % 56 %   Neutro Abs 4.4 1.7 - 7.7 K/uL   Lymphocytes Relative 30 %   Lymphs Abs 2.4 0.7 - 4.0 K/uL   Monocytes Relative 8 %   Monocytes Absolute 0.6 0.1 - 1.0 K/uL   Eosinophils Relative 5 %   Eosinophils Absolute 0.4 0.0 - 0.5 K/uL   Basophils Relative 1 %   Basophils Absolute 0.1 0.0 - 0.1 K/uL   Immature Granulocytes 0 %   Abs Immature Granulocytes 0.02 0.00 - 0.07 K/uL    Comment: Performed at [facility], [address] [place], [place] [address]  Hemoglobin A1c     Status: Abnormal   Collection Time: 03/09/18  4:48 AM  Result Value Ref Range   Hgb A1c MFr Bld 12.9 (H) 4.8 - 5.6 %    Comment: (NOTE) Pre diabetes:          5.7%-6.4% Diabetes:              >6.4% Glycemic control for   <7.0% adults with diabetes    Mean Plasma Glucose 323.53 mg/dL    Comment: Performed at [facility] [address], [place], [place] [address]  Lipid panel     Status: Abnormal   Collection Time: 03/09/18  4:48 AM  Result Value Ref Range   Cholesterol 313 (H) 0 - 200 mg/dL   Triglycerides 529 (H) <150 mg/dL   HDL 36 (L) >40 mg/dL   Total CHOL/HDL Ratio 8.7 RATIO   VLDL UNABLE TO CALCULATE IF TRIGLYCERIDE OVER 400 mg/dL 0 - 40 mg/dL   LDL Cholesterol UNABLE TO CALCULATE IF TRIGLYCERIDE OVER 400 mg/dL 0 - 99 mg/dL    Comment:        Total Cholesterol/HDL:CHD Risk Coronary Heart Disease Risk Table                     Men   Women  1/2 Average Risk   3.4   3.3  Average Risk       5.0   4.4  2 X Average Risk   9.6   7.1  3 X Average Risk  23.4   11.0        Use the calculated Patient Ratio above and the CHD Risk Table to determine the patient's CHD Risk.        ATP III CLASSIFICATION  (LDL):  <100  mg/dL   Optimal  100-129  mg/dL   Near or Above                    Optimal  130-159  mg/dL   Borderline  160-189  mg/dL   High  >190     mg/dL   Very High Performed at [facility] [address] [place], [place] [address]   Glucose, capillary     Status: Abnormal   Collection Time: 03/09/18  6:44 AM  Result Value Ref Range   Glucose-Capillary 244 (H) 70 - 99 mg/dL   Comment 1 Notify RN    Comment 2 Document in Chart   APTT     Status: None   Collection Time: 03/09/18  7:43 AM  Result Value Ref Range   aPTT 27 24 - 36 seconds    Comment: Performed at [facility] [address] [place], [place] [address]  Comprehensive metabolic panel     Status: Abnormal   Collection Time: 03/09/18 10:22 AM  Result Value Ref Range   Sodium 135 135 - 145 mmol/L   Potassium 4.7 3.5 - 5.1 mmol/L   Chloride 107 98 - 111 mmol/L   CO2 19 (L) 22 - 32 mmol/L   Glucose, Bld 199 (H) 70 - 99 mg/dL   BUN 24 (H) 6 - 20 mg/dL   Creatinine, Ser 1.59 (H) 0.44 - 1.00 mg/dL   Calcium 8.7 (L) 8.9 - 10.3 mg/dL   Total Protein QUANTITY NOT SUFFICIENT, UNABLE TO PERFORM TEST 6.5 - 8.1 g/dL    Comment: K.GETZ,RN 03/09/18 1250 DAVISB   Albumin 2.6 (L) 3.5 - 5.0 g/dL   AST 59 (H) 15 - 41 U/L   ALT 63 (H) 0 - 44 U/L   Alkaline Phosphatase QUANTITY NOT SUFFICIENT, UNABLE TO PERFORM TEST 38 - 126 U/L    Comment: K.GETZ,RN 03/09/18 1250 DAVISB   Total Bilirubin 0.4 0.3 - 1.2 mg/dL   GFR calc non Af Amer 37 (L) >60 mL/min   GFR calc Af Amer 43 (L) >60 mL/min    Comment: (NOTE) The eGFR has been calculated using the CKD EPI equation. This calculation has not been validated in all clinical situations. eGFR's persistently <60 mL/min signify possible Chronic Kidney Disease.    Anion gap 9 5 - 15    Comment: Performed at [facility] [address], [place], [place] [address]  Glucose, capillary     Status: Abnormal   Collection Time: 03/09/18 11:14 AM  Result Value Ref Range     Glucose-Capillary 206 (H) 70 - 99 mg/dL  Glucose, capillary     Status: Abnormal   Collection Time: 03/09/18  4:47 PM  Result Value Ref Range   Glucose-Capillary 186 (H) 70 - 99 mg/dL  Glucose, capillary     Status: Abnormal   Collection Time: 03/09/18  9:49 PM  Result Value Ref Range   Glucose-Capillary 221 (H) 70 - 99 mg/dL   Comment 1 Notify RN    Comment 2 Document in Chart   Basic metabolic panel     Status: Abnormal   Collection Time: 03/10/18  4:42 AM  Result Value Ref Range   Sodium 137 135 - 145 mmol/L   Potassium 4.1 3.5 - 5.1 mmol/L   Chloride 108 98 - 111 mmol/L   CO2 19 (L) 22 - 32 mmol/L   Glucose, Bld 155 (H) 70 - 99 mg/dL   BUN 20 6 -  20 mg/dL   Creatinine, Ser 1.67 (H) 0.44 - 1.00 mg/dL   Calcium 8.7 (L) 8.9 - 10.3 mg/dL   GFR calc non Af Amer 35 (L) >60 mL/min   GFR calc Af Amer 40 (L) >60 mL/min    Comment: (NOTE) The eGFR has been calculated using the CKD EPI equation. This calculation has not been validated in all clinical situations. eGFR's persistently <60 mL/min signify possible Chronic Kidney Disease.    Anion gap 10 5 - 15    Comment: Performed at [facility] [address], [place] [address]  CBC     Status: None   Collection Time: 03/10/18  4:42 AM  Result Value Ref Range   WBC 8.9 4.0 - 10.5 K/uL   RBC 4.84 3.87 - 5.11 MIL/uL   Hemoglobin 13.8 12.0 - 15.0 g/dL   HCT 41.9 36.0 - 46.0 %   MCV 86.6 80.0 - 100.0 fL   MCH 28.5 26.0 - 34.0 pg   MCHC 32.9 30.0 - 36.0 g/dL   RDW 12.8 11.5 - 15.5 %   Platelets 262 150 - 400 K/uL   nRBC 0.0 0.0 - 0.2 %    Comment: Performed at [facility], [place] [address], [place], [place] [address]  Glucose, capillary     Status: Abnormal   Collection Time: 03/10/18  6:26 AM  Result Value Ref Range   Glucose-Capillary 143 (H) 70 - 99 mg/dL   Comment 1 Notify RN    Comment 2 Document in Chart   Glucose, capillary     Status: Abnormal   Collection Time: 03/10/18 11:01 AM  Result Value  Ref Range   Glucose-Capillary 195 (H) 70 - 99 mg/dL   No results found.     Medical Problem List and Plan: 1.  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  DVT Prophylaxis/Anticoagulation: SCDs. 3. Pain Management:  Cymbalta 60 mg daily 4. Mood:  Seroquel 50 mg daily at bedtime 5. Neuropsych: This patient is capable of making decisions on her own behalf. 6. Skin/Wound Care:  Routine skin checks 7. Fluids/Electrolytes/Nutrition:  Routine and out's with follow-up chemistries 8. Seizure disorder. Keppra 500 mg twice a day 9.Diabetes mellitus with peripheral neuropathy. Hemoglobin A1c 12.9.Lantus insulin 30 units daily at bedtime. Check blood sugars before meals and at bedtime. Diabetic teaching 10. CKD stage II. Follow-up chemistries 11. Hypertension. Permissive hypertension. Patient on Norvasc 10 mg daily, Coreg 25 mg twice a day,hygroton 25 mg daily, hydralazine 50 mg 3 times a day, lisinopril 40 mg daily prior to admission. Resume as needed 12. Hyperlipidemia. Lipitor  Post Admission Physician Evaluation: 1. Functional deficits secondary  to right ACA infarct(s) with left hemiparesis. 2. Patient is admitted to receive collaborative, interdisciplinary care between the physiatrist, rehab nursing staff, and therapy team. 3. Patient's level of medical complexity and substantial therapy needs in context of that medical necessity cannot be provided at a lesser intensity of care such as a SNF. 4. Patient has experienced substantial functional loss from his/her baseline which was documented above under the "Functional History" and "Functional Status" headings.  Judging by the patient's diagnosis, physical exam, and functional history, the patient has potential for functional progress which will result in  measurable gains while on inpatient rehab.  These gains will be of substantial and practical use upon discharge  in facilitating mobility and  self-care at the household level. 5. Physiatrist will provide 24 hour management of medical needs as well as oversight of the therapy plan/treatment and provide guidance as appropriate regarding the interaction of the two. 6. The Preadmission Screening has been reviewed and patient status is unchanged unless otherwise stated above. 7. 24 hour rehab nursing will assist with bladder management, bowel management, safety, skin/wound care, disease management, medication administration, pain management and patient education  and help integrate therapy concepts, techniques,education, etc. 8. PT will assess and treat for/with: Lower extremity strength, range of motion, stamina, balance, functional mobility, safety, adaptive techniques and equipment, NMR, visual-spatial awareness, ego support, family ed.   Goals are: supervision. 9. OT will assess and treat for/with: ADL's, functional mobility, safety, upper extremity strength, adaptive techniques and equipment, NMR, visual-spatial awareness, family ed.   Goals are: supervision to min assist. Therapy may proceed with showering this patient. 10. SLP will assess and treat for/with: cognition, communication.  Goals are: supervision. 11. Case Management and Social Worker will assess and treat for psychological issues and discharge planning. 12. Team conference will be held weekly to assess progress toward goals and to determine barriers to discharge. 13. Patient will receive at least 3 hours of therapy per day at least 5 days per week. 14. ELOS: 15-22 days       15. Prognosis:  excellent   I have personally performed a face to face diagnostic evaluation of this patient and formulated the key components of the plan.  Additionally, I have personally reviewed laboratory data, imaging studies, as well as relevant notes and concur  with the physician assistant's documentation above.  Meredith Staggers, MD, FAAPMR    Lavon Paganini [place], PA-C 03/10/2018

## 2018-03-10 NOTE — Consult Note (Signed)
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 Patient lives with husband and he works during the day.Presented [facility] 03/08/2018 with increasing left lower extremity weakness and blurred vision.cranial CT scan completed in the ED showing subacute infarct right ACA territory remote right occipital and inferior cerebellar infarct without intracranial hemorrhage. MRI showed slight interval expansion worsening of right ACA territory infarction as compared to previous study.previous CTA concerning for significant right MCA disease with neurology consulted as well as interventional radiology recommendations transferred to [facility] for further evaluation.Cerebral angiogram completed 03/09/2018 showing occluded right ACA A1/2 junction. 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 Neurology follow-up with workup currently ongoing. Tolerating a regular diet. Therapy evaluation completed with recommendations of physical medicine rehabilitation consult.   Review of Systems  Constitutional: Negative for chills and fever.  HENT: Negative for hearing loss.   Eyes: Positive for blurred vision.  Respiratory: Negative for cough and shortness of breath.   Cardiovascular: Positive for leg swelling. Negative  for chest pain and palpitations.  Gastrointestinal: Positive for constipation. Negative for nausea and vomiting.  Genitourinary: Negative for dysuria, flank pain and hematuria.  Musculoskeletal: Positive for back pain and joint pain.  Skin: Negative for rash.  Neurological: Positive for focal weakness and headaches.  Psychiatric/Behavioral: The patient has insomnia.        Anxiety  All other systems reviewed and are negative.  Past Medical History:  Diagnosis Date  . Anxiety   . Chronic kidney disease   . Diabetes mellitus without complication (HCC)   . Fibromyalgia   . Headache   . History of kidney stones   . Hyperlipemia   . Hypertension   . Hypothyroidism   . Insomnia   . Pancreatitis    chronic  . Sleep apnea   . Stroke (cerebrum) [facility])    Past Surgical History:  Procedure Laterality Date  . ABDOMINAL HYSTERECTOMY    . CHOLECYSTECTOMY    . ROTATOR CUFF REPAIR Right   . SHOULDER SURGERY Left    Family History  Problem Relation Age of Onset  . Diabetes Neg Hx    Social History:  reports that she has never smoked. She has never used smokeless tobacco. She reports that she does not drink alcohol or use drugs. Allergies: No Known Allergies Medications Prior to Admission  Medication Sig Dispense Refill  . acetaminophen (TYLENOL) 500 MG tablet Take 1,000 mg by mouth every 6 (six) hours as needed for mild pain or headache.    Marland Kitchen amLODipine (NORVASC) 10 MG tablet Take 10 mg by mouth daily.     Marland Kitchen aspirin EC 81 MG EC tablet Take 1 tablet (81 mg total) by mouth daily. 30 tablet 0  . atorvastatin (LIPITOR) 80 MG tablet Take 1 tablet (80 mg total) by mouth at bedtime. 30 tablet 0  . carvedilol (COREG) 25 MG tablet Take 25 mg by mouth 2 (two) times  daily with a meal.     . chlorthalidone (HYGROTON) 25 MG tablet Take 25 mg by mouth daily.    . clopidogrel (PLAVIX) 75 MG tablet Take 1 tablet (75 mg total) by mouth daily. 30 tablet 0  . DULoxetine (CYMBALTA) 60 MG capsule Take 60  mg by mouth daily.    . hydrALAZINE (APRESOLINE) 50 MG tablet Take 50 mg by mouth 3 (three) times daily.    . insulin aspart (NOVOLOG FLEXPEN) 100 UNIT/ML FlexPen Inject 1-15 Units into the skin 3 (three) times daily with meals. CBG 121 - 150: 2 units  CBG 151 - 200: 3 units  CBG 201 - 250: 5 units  CBG 251 - 300: 8 units  CBG 301 - 350: 11 units  CBG 351 - 400: 15 units 15 mL 11  . insulin aspart (NOVOLOG) 100 UNIT/ML injection Inject 5 Units into the skin 3 (three) times daily with meals. 10 mL 11  . Insulin Glargine (LANTUS SOLOSTAR) 100 UNIT/ML Solostar Pen Inject 30 Units into the skin at bedtime. 15 mL 11  . Insulin Pen Needle 31G X 4 MM MISC 1 each by Does not apply route 4 (four) times daily -  before meals and at bedtime. 120 each 0  . levETIRAcetam (KEPPRA) 500 MG tablet Take 1 tablet (500 mg total) by mouth 2 (two) times daily. 60 tablet 0  . lisinopril (PRINIVIL,ZESTRIL) 40 MG tablet Take 40 mg by mouth daily.    . QUEtiapine (SEROQUEL) 50 MG tablet Take 50 mg by mouth at bedtime.    . senna-docusate (SENOKOT-S) 8.6-50 MG tablet Take 1 tablet by mouth at bedtime as needed for moderate constipation. 30 tablet 0    Home: Home Living Family/patient expects to be discharged to:: Private residence Living Arrangements: Children, Spouse/significant other Available Help at Discharge: Family, Available PRN/intermittently Type of Home: House Home Access: Stairs to enter [employer] of Steps: 4 STE  Entrance Stairs-Rails: Left Home Layout: One level Bathroom Shower/Tub: Psychologist, counselling, Curtain Firefighter: Standard Home Equipment: Environmental consultant - 2 wheels, Grab bars - toilet, Grab bars - tub/shower  Lives With: Spouse  Functional History: Prior Function Level of Independence: Independent Comments: pt reports prior to first CVA independent, after previous admission requires some assist but husband works and was using RW for mobility during day independently  Functional  Status:  Mobility: Bed Mobility Overal bed mobility: Needs Assistance Bed Mobility: Supine to Sit, Sit to Supine Supine to sit: Mod assist Sit to supine: Min guard General bed mobility comments: min to manage L LE, Initiation and follow through a problem. Cues to redirect to task. Transfers Overall transfer level: Needs assistance Equipment used: Rolling walker (2 wheeled) Transfers: Sit to/from Stand, [employer] Transfers Sit to Stand: Mod assist Stand pivot transfers: Mod assist General transfer comment: mod assist to power up into standing with cueing for hand placement and safety, decreased initation and motor planning with decreased awareness to mgmt of L side Ambulation/Gait Ambulation/Gait assistance: Mod assist Gait Distance (Feet): 80 Feet Assistive device: Rolling walker (2 wheeled) Gait Pattern/deviations: Step-to pattern General Gait Details: Gait cadence slow with pt having trouble initiating toe off with follow through labored..  pt needing cues for posture, approp approximation to the RW and to keep focus.  Stability assist Gait velocity: decreased Gait velocity interpretation: <1.31 ft/sec, indicative of household ambulator    ADL: ADL Overall ADL's : Needs assistance/impaired Grooming: Supervision/safety, Set up, Sitting Upper Body Bathing: Set up, Sitting Lower Body  Bathing: Maximal assistance, Sit to/from stand Upper Body Dressing : Minimal assistance, Sitting Lower Body Dressing: Total assistance, Sit to/from stand Toilet Transfer: Moderate assistance, BSC, RW, Cueing for sequencing, Cueing for safety, Stand-pivot Toileting- Clothing Manipulation and Hygiene: Moderate assistance, +2 for safety/equipment, Sit to/from stand, +2 for physical assistance Toileting - Clothing Manipulation Details (indicate cue type and reason): +2 to manage clothing while therapist mainatins balance Functional mobility during ADLs: Moderate assistance, Rolling walker, Cueing for  safety, Cueing for sequencing General ADL Comments: Pt limited by L inattention, L sided weakness, impaired cognition, decreased initation and motor planning.   Cognition: Cognition Overall Cognitive Status: History of cognitive impairments - at baseline Arousal/Alertness: Awake/alert Orientation Level: Oriented X4 Attention: Sustained Sustained Attention: Appears intact Memory: Impaired(history of baseline impairments) Memory Impairment: Decreased short term memory Awareness: Appears intact Executive Function: (at baseline per chart review) Cognition Arousal/Alertness: Awake/alert Behavior During Therapy: Flat affect, WFL for tasks assessed/performed Overall Cognitive Status: History of cognitive impairments - at baseline Area of Impairment: Attention, Memory, Following commands, Safety/judgement, Awareness, Problem solving Current Attention Level: Sustained Memory: Decreased recall of precautions, Decreased short-term memory Following Commands: Follows one step commands consistently, Follows one step commands with increased time Safety/Judgement: Decreased awareness of safety, Decreased awareness of deficits Awareness: Emergent Problem Solving: Slow processing, Decreased initiation, Difficulty sequencing, Requires verbal cues, Requires tactile cues General Comments: pt with slow responses and limited verbalizations throughout session; patients husband reports increased difficulty with memory and problem sovling since current admission  Blood pressure (!) 171/91, pulse 94, temperature 98.7 F (37.1 C), temperature source Oral, resp. rate 18, height 5\' 5"  (1.651 m), weight 102.4 kg, SpO2 93 %. Physical Exam  Constitutional:  obese  HENT:  Head: Normocephalic.  Eyes: Pupils are equal, round, and reactive to light.  Neck: Normal range of motion.  Cardiovascular: Normal rate.  Respiratory: Effort normal.  GI: Soft.  Musculoskeletal: She exhibits edema.  Neurological:  Patient is  alert complaints of headache. Follow simple commands. Provides her name and age. Slow to process. Word finding deficits. RUE 4/5. LUE 3+ to 4/5. RLE 4/5. LLE 2-3/5---inconsistent effort. Decreased sensation to LT/pain on left. Mild left inattention  Psychiatric:  anxious    Results for orders placed or performed during the hospital encounter of 03/08/18 (from the past 24 hour(s))  Glucose, capillary     Status: Abnormal   Collection Time: 03/09/18  6:44 AM  Result Value Ref Range   Glucose-Capillary 244 (H) 70 - 99 mg/dL   Comment 1 Notify RN    Comment 2 Document in Chart   APTT     Status: None   Collection Time: 03/09/18  7:43 AM  Result Value Ref Range   aPTT 27 24 - 36 seconds  Comprehensive metabolic panel     Status: Abnormal   Collection Time: 03/09/18 10:22 AM  Result Value Ref Range   Sodium 135 135 - 145 mmol/L   Potassium 4.7 3.5 - 5.1 mmol/L   Chloride 107 98 - 111 mmol/L   CO2 19 (L) 22 - 32 mmol/L   Glucose, Bld 199 (H) 70 - 99 mg/dL   BUN 24 (H) 6 - 20 mg/dL   Creatinine, Ser 9.14 (H) 0.44 - 1.00 mg/dL   Calcium 8.7 (L) 8.9 - 10.3 mg/dL   Total Protein QUANTITY NOT SUFFICIENT, UNABLE TO PERFORM TEST 6.5 - 8.1 g/dL   Albumin 2.6 (L) 3.5 - 5.0 g/dL   AST 59 (H) 15 - 41 U/L  ALT 63 (H) 0 - 44 U/L   Alkaline Phosphatase QUANTITY NOT SUFFICIENT, UNABLE TO PERFORM TEST 38 - 126 U/L   Total Bilirubin 0.4 0.3 - 1.2 mg/dL   GFR calc non Af Amer 37 (L) >60 mL/min   GFR calc Af Amer 43 (L) >60 mL/min   Anion gap 9 5 - 15  Glucose, capillary     Status: Abnormal   Collection Time: 03/09/18 11:14 AM  Result Value Ref Range   Glucose-Capillary 206 (H) 70 - 99 mg/dL  Glucose, capillary     Status: Abnormal   Collection Time: 03/09/18  4:47 PM  Result Value Ref Range   Glucose-Capillary 186 (H) 70 - 99 mg/dL  Glucose, capillary     Status: Abnormal   Collection Time: 03/09/18  9:49 PM  Result Value Ref Range   Glucose-Capillary 221 (H) 70 - 99 mg/dL   Comment 1 Notify  RN    Comment 2 Document in Chart   CBC     Status: None   Collection Time: 03/10/18  4:42 AM  Result Value Ref Range   WBC 8.9 4.0 - 10.5 K/uL   RBC 4.84 3.87 - 5.11 MIL/uL   Hemoglobin 13.8 12.0 - 15.0 g/dL   HCT 16.1 09.6 - 04.5 %   MCV 86.6 80.0 - 100.0 fL   MCH 28.5 26.0 - 34.0 pg   MCHC 32.9 30.0 - 36.0 g/dL   RDW 40.9 81.1 - 91.4 %   Platelets 262 150 - 400 K/uL   nRBC 0.0 0.0 - 0.2 %   No results found.   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 Does the need for close, 24 hr/day medical supervision in concert with the patient's rehab needs make it unreasonable for this patient to be served in a less intensive setting? Yes 2. Co-Morbidities requiring supervision/potential complications: HTN, OSA, DM, CKDII, FMS, prior CVA 3. Due to bladder management, bowel management, safety, skin/wound care, disease management, medication administration, pain management and patient education, does the patient require 24 hr/day rehab nursing? Yes 4. Does the patient require coordinated care of a physician, rehab nurse, PT (1-2 hrs/day, 5 days/week), OT (1-2 hrs/day, 5 days/week) and SLP (1-2 hrs/day, 5 days/week) to address physical and functional deficits in the context of the above medical diagnosis(es)? Yes Addressing deficits in the following areas: balance, endurance, locomotion, strength, transferring, bowel/bladder control, bathing, dressing, feeding, grooming, toileting, cognition, language and psychosocial support 5. Can the patient actively participate in an intensive therapy program of at least 3 hrs of therapy per day at least 5 days per week? Yes 6. The potential for patient to make measurable gains while on inpatient rehab is excellent 7. Anticipated functional outcomes upon discharge from inpatient rehab are supervision  with PT, supervision and min assist with OT, supervision with SLP. 8. Estimated rehab length  of stay to reach the above functional goals is: 15-23 days 9. Anticipated D/C setting: Home 10. Anticipated post D/C treatments: HH therapy 11. Overall Rehab/Functional Prognosis: excellent  RECOMMENDATIONS: This patient's condition is appropriate for continued rehabilitative care in the following setting: CIR Patient has agreed to participate in recommended program. Yes Note that insurance prior authorization may be required for reimbursement for recommended care.  Comment: Rehab Admissions Coordinator to follow up.  Thanks,  Ranelle Oyster, MD, [place] Dom  I have personally performed a face to face diagnostic evaluation of this patient. Additionally, I have reviewed and concur with the physician  assistant's documentation above.    Mcarthur RossettiDaniel J Angiulli, PA-C 03/10/2018

## 2018-03-11 ENCOUNTER — Other Ambulatory Visit: Payer: Self-pay

## 2018-03-11 ENCOUNTER — Inpatient Hospital Stay (HOSPITAL_COMMUNITY)
Admission: RE | Admit: 2018-03-11 | Discharge: 2018-03-23 | DRG: 057 | Disposition: A | Discharge status: Home Health | Payer: 59 | Source: Intra-hospital | Attending: Physical Medicine & Rehabilitation | Admitting: Physical Medicine & Rehabilitation

## 2018-03-11 ENCOUNTER — Encounter (HOSPITAL_COMMUNITY): Payer: Self-pay | Admitting: Physician Assistant

## 2018-03-11 DIAGNOSIS — I6602 Occlusion and stenosis of left middle cerebral artery: Secondary | ICD-10-CM | POA: Diagnosis present

## 2018-03-11 DIAGNOSIS — I69354 Hemiplegia and hemiparesis following cerebral infarction affecting left non-dominant side: Secondary | ICD-10-CM | POA: Diagnosis present

## 2018-03-11 DIAGNOSIS — I129 Hypertensive chronic kidney disease with stage 1 through stage 4 chronic kidney disease, or unspecified chronic kidney disease: Secondary | ICD-10-CM | POA: Diagnosis present

## 2018-03-11 DIAGNOSIS — E785 Hyperlipidemia, unspecified: Secondary | ICD-10-CM | POA: Diagnosis present

## 2018-03-11 DIAGNOSIS — I63311 Cerebral infarction due to thrombosis of right middle cerebral artery: Secondary | ICD-10-CM | POA: Diagnosis not present

## 2018-03-11 DIAGNOSIS — G4733 Obstructive sleep apnea (adult) (pediatric): Secondary | ICD-10-CM | POA: Diagnosis present

## 2018-03-11 DIAGNOSIS — E1142 Type 2 diabetes mellitus with diabetic polyneuropathy: Secondary | ICD-10-CM | POA: Diagnosis present

## 2018-03-11 DIAGNOSIS — I10 Essential (primary) hypertension: Secondary | ICD-10-CM | POA: Diagnosis not present

## 2018-03-11 DIAGNOSIS — I6611 Occlusion and stenosis of right anterior cerebral artery: Secondary | ICD-10-CM | POA: Diagnosis present

## 2018-03-11 DIAGNOSIS — Z794 Long term (current) use of insulin: Secondary | ICD-10-CM

## 2018-03-11 DIAGNOSIS — Z7902 Long term (current) use of antithrombotics/antiplatelets: Secondary | ICD-10-CM

## 2018-03-11 DIAGNOSIS — Z9989 Dependence on other enabling machines and devices: Secondary | ICD-10-CM

## 2018-03-11 DIAGNOSIS — K5909 Other constipation: Secondary | ICD-10-CM | POA: Diagnosis present

## 2018-03-11 DIAGNOSIS — E1122 Type 2 diabetes mellitus with diabetic chronic kidney disease: Secondary | ICD-10-CM | POA: Diagnosis present

## 2018-03-11 DIAGNOSIS — E039 Hypothyroidism, unspecified: Secondary | ICD-10-CM | POA: Diagnosis present

## 2018-03-11 DIAGNOSIS — M797 Fibromyalgia: Secondary | ICD-10-CM | POA: Diagnosis present

## 2018-03-11 DIAGNOSIS — R1110 Vomiting, unspecified: Secondary | ICD-10-CM | POA: Diagnosis present

## 2018-03-11 DIAGNOSIS — Z87442 Personal history of urinary calculi: Secondary | ICD-10-CM

## 2018-03-11 DIAGNOSIS — Z7982 Long term (current) use of aspirin: Secondary | ICD-10-CM

## 2018-03-11 DIAGNOSIS — N182 Chronic kidney disease, stage 2 (mild): Secondary | ICD-10-CM | POA: Diagnosis present

## 2018-03-11 DIAGNOSIS — Z8673 Personal history of transient ischemic attack (TIA), and cerebral infarction without residual deficits: Secondary | ICD-10-CM

## 2018-03-11 DIAGNOSIS — I6330 Cerebral infarction due to thrombosis of unspecified cerebral artery: Secondary | ICD-10-CM

## 2018-03-11 DIAGNOSIS — G40909 Epilepsy, unspecified, not intractable, without status epilepticus: Secondary | ICD-10-CM | POA: Diagnosis present

## 2018-03-11 DIAGNOSIS — E119 Type 2 diabetes mellitus without complications: Secondary | ICD-10-CM

## 2018-03-11 DIAGNOSIS — I639 Cerebral infarction, unspecified: Secondary | ICD-10-CM

## 2018-03-11 DIAGNOSIS — I63321 Cerebral infarction due to thrombosis of right anterior cerebral artery: Secondary | ICD-10-CM | POA: Diagnosis not present

## 2018-03-11 HISTORY — DX: Cerebral infarction due to thrombosis of unspecified cerebral artery: I63.30

## 2018-03-11 LAB — GLUCOSE, CAPILLARY
GLUCOSE-CAPILLARY: 192 mg/dL — AB (ref 70–99)
GLUCOSE-CAPILLARY: 163 mg/dL — AB (ref 70–99)
Glucose-Capillary: 178 mg/dL — ABNORMAL HIGH (ref 70–99)
Glucose-Capillary: 196 mg/dL — ABNORMAL HIGH (ref 70–99)

## 2018-03-11 MED ORDER — ACETAMINOPHEN 325 MG PO TABS
650.0000 mg | ORAL_TABLET | Freq: Four times a day (QID) | ORAL | Status: DC | PRN
  Administered 2018-03-16 – 2018-03-22 (×2): 650 mg via ORAL
  Filled 2018-03-11 (×2): qty 2

## 2018-03-11 MED ORDER — BISACODYL 10 MG RE SUPP: 10.0000 mg | Freq: Every day | RECTAL | Status: DC | PRN

## 2018-03-11 MED ORDER — INSULIN GLARGINE 100 UNIT/ML ~~LOC~~ SOLN
30.0000 [IU] | Freq: Every day | SUBCUTANEOUS | Status: DC
  Administered 2018-03-11 – 2018-03-14 (×4): 30 [IU] via SUBCUTANEOUS
  Filled 2018-03-11 (×5): qty 0.3

## 2018-03-11 MED ORDER — SENNOSIDES-DOCUSATE SODIUM 8.6-50 MG PO TABS: 1.0000 | ORAL_TABLET | Freq: Every evening | ORAL | Status: DC | PRN

## 2018-03-11 MED ORDER — ACETAMINOPHEN 650 MG RE SUPP: 650.0000 mg | Freq: Four times a day (QID) | RECTAL | Status: DC | PRN

## 2018-03-11 MED ORDER — LEVETIRACETAM 500 MG PO TABS
500.0000 mg | ORAL_TABLET | Freq: Two times a day (BID) | ORAL | Status: DC
  Administered 2018-03-11 – 2018-03-23 (×24): 500 mg via ORAL
  Filled 2018-03-11 (×24): qty 1

## 2018-03-11 MED ORDER — ATORVASTATIN CALCIUM 80 MG PO TABS
80.0000 mg | ORAL_TABLET | Freq: Every day | ORAL | Status: DC
  Administered 2018-03-11 – 2018-03-22 (×12): 80 mg via ORAL
  Filled 2018-03-11 (×12): qty 1

## 2018-03-11 MED ORDER — CLOPIDOGREL BISULFATE 75 MG PO TABS
75.0000 mg | ORAL_TABLET | Freq: Every day | ORAL | Status: DC
  Administered 2018-03-11 – 2018-03-23 (×13): 75 mg via ORAL
  Filled 2018-03-11 (×13): qty 1

## 2018-03-11 MED ORDER — ONDANSETRON HCL 4 MG PO TABS
4.0000 mg | ORAL_TABLET | Freq: Four times a day (QID) | ORAL | Status: DC | PRN
  Administered 2018-03-14: 4 mg via ORAL
  Filled 2018-03-11: qty 1

## 2018-03-11 MED ORDER — INSULIN GLARGINE 100 UNIT/ML SOLOSTAR PEN: 34.0000 [IU] | PEN_INJECTOR | Freq: Every day | SUBCUTANEOUS | 11 refills | Status: DC

## 2018-03-11 MED ORDER — SODIUM CHLORIDE 0.9 % IV SOLN: INTRAVENOUS | Status: DC

## 2018-03-11 MED ORDER — DULOXETINE HCL 60 MG PO CPEP
60.0000 mg | ORAL_CAPSULE | Freq: Every day | ORAL | Status: DC
  Administered 2018-03-12 – 2018-03-23 (×12): 60 mg via ORAL
  Filled 2018-03-11 (×12): qty 1

## 2018-03-11 MED ORDER — INSULIN ASPART 100 UNIT/ML ~~LOC~~ SOLN
0.0000 [IU] | Freq: Three times a day (TID) | SUBCUTANEOUS | Status: DC
  Administered 2018-03-11: 4 [IU] via SUBCUTANEOUS
  Administered 2018-03-12: 3 [IU] via SUBCUTANEOUS
  Administered 2018-03-12 – 2018-03-13 (×3): 4 [IU] via SUBCUTANEOUS
  Administered 2018-03-13: 7 [IU] via SUBCUTANEOUS
  Administered 2018-03-13 – 2018-03-14 (×2): 4 [IU] via SUBCUTANEOUS
  Administered 2018-03-14 – 2018-03-15 (×2): 7 [IU] via SUBCUTANEOUS
  Administered 2018-03-15 – 2018-03-16 (×2): 4 [IU] via SUBCUTANEOUS
  Administered 2018-03-16: 3 [IU] via SUBCUTANEOUS
  Administered 2018-03-17 (×3): 4 [IU] via SUBCUTANEOUS
  Administered 2018-03-18: 3 [IU] via SUBCUTANEOUS
  Administered 2018-03-19: 4 [IU] via SUBCUTANEOUS
  Administered 2018-03-19: 11 [IU] via SUBCUTANEOUS
  Administered 2018-03-20 (×2): 4 [IU] via SUBCUTANEOUS
  Administered 2018-03-21: 3 [IU] via SUBCUTANEOUS
  Administered 2018-03-21 – 2018-03-22 (×2): 4 [IU] via SUBCUTANEOUS
  Administered 2018-03-22: 11 [IU] via SUBCUTANEOUS
  Administered 2018-03-23: 3 [IU] via SUBCUTANEOUS

## 2018-03-11 MED ORDER — CLOPIDOGREL BISULFATE 75 MG PO TABS: 75.0000 mg | ORAL_TABLET | Freq: Every day | ORAL | 11 refills | Status: DC

## 2018-03-11 MED ORDER — ONDANSETRON HCL 4 MG/2ML IJ SOLN: 4.0000 mg | Freq: Four times a day (QID) | INTRAMUSCULAR | Status: DC | PRN

## 2018-03-11 MED ORDER — QUETIAPINE FUMARATE 50 MG PO TABS
50.0000 mg | ORAL_TABLET | Freq: Every day | ORAL | Status: DC
  Administered 2018-03-11 – 2018-03-22 (×12): 50 mg via ORAL
  Filled 2018-03-11 (×12): qty 1

## 2018-03-11 MED ORDER — ASPIRIN EC 81 MG PO TBEC
81.0000 mg | DELAYED_RELEASE_TABLET | Freq: Every day | ORAL | Status: DC
  Administered 2018-03-12 – 2018-03-23 (×12): 81 mg via ORAL
  Filled 2018-03-11 (×12): qty 1

## 2018-03-11 NOTE — Progress Notes (Signed)
Inpatient Diabetes Program Recommendations  AACE/ADA: New Consensus Statement on Inpatient Glycemic Control (2019)  Target Ranges:  Prepandial:   less than 140 mg/dL      Peak postprandial:   less than 180 mg/dL (1-2 hours)      Critically ill patients:  140 - 180 mg/dL   Results for Minus BreedingCARDIN, Aviona (MRN [id number]) as of 03/11/2018 10:12  Ref. Range 03/10/2018 06:26 03/10/2018 11:01 03/10/2018 16:38 03/10/2018 21:17 03/11/2018 06:17  Glucose-Capillary Latest Ref Range: 70 - 99 mg/dL 782143 (H) 956195 (H) 213234 (H) 202 (H) 178 (H)   Review of Glycemic Control  Outpatient Diabetes medications: Lantus 30 units QHS, Novolog 5 units TID with meals Current orders for Inpatient glycemic control: Lantus 30 units QHS, Novolog 0-20 units TID with meals  Inpatient Diabetes Program Recommendations:  Insulin - Meal Coverage: Please consider ordering Novolog 3 units TID with meals for meal coverage if patient is eating at least 50% of meals.  Thanks, Orlando PennerMarie Quade Ramirez, RN, MSN, CDE Diabetes Coordinator Inpatient Diabetes Program [phone] (Team Pager from 8am to 5pm)

## 2018-03-11 NOTE — Progress Notes (Signed)
Deatra Inaan Angiulli PAC was contacted to clarify some orders that show on the active orders regarding a procedure,orders were discontinue after verification.

## 2018-03-11 NOTE — Care Management Note (Signed)
Case Management Note  Patient Details  Name: [name] MRN: [id number] Date of Birth: 11/28/1965  Subjective/Objective:                    Action/Plan: Pt discharging to CIR today. CM signing off.   Expected Discharge Date:  03/11/18               Expected Discharge Plan:  IP Rehab Facility  In-House Referral:     Discharge planning Services  CM Consult  Post Acute Care Choice:    Choice offered to:     DME Arranged:    DME Agency:     HH Arranged:    HH Agency:     Status of Service:  Completed, signed off  If discussed at [employer] Meetings, dates discussed:    Additional Comments:  [name], RN 03/11/2018, 12:21 PM

## 2018-03-11 NOTE — Progress Notes (Signed)
Occupational Therapy Progress Note (late entry for 03/10/18)  Pt seen with PT.  She requires mod A - mod A +2 for balance activities needed for LB ADLs and functional transfers.  She demonstrates impaired ability to initiate activity, appears possibly apraxic and also question if there is a component of Lt inattention.  She demonstrates a significant delay in processing time.   Spouse is supportive.  Feel she would benefit from the intensity and consistency of CIR.  Due to the severity of her deficits, she would benefit from a treatment team who specializes in CVA rehab in order to maximize her independence and safety.   Will follow acutely.    03/10/18 1329  OT Visit Information  Last OT Received On 03/11/18  Assistance Needed +2  PT/OT/SLP Co-Evaluation/Treatment Yes  Reason for Co-Treatment Complexity of the patient's impairments (multi-system involvement);For patient/therapist safety;To address functional/ADL transfers  OT goals addressed during session ADL's and self-care  History of Present Illness Pt is a 52 y.o. female with medical history significant of anxiety, chronic kidney disease, type 2 diabetes, fibromyalgia, headache, history of urolithiasis, hyperlipidemia, hypertension, hypothyroidism, insomnia chronic pancreatitis, sleep apnea, recent CVA who [facility] to recrudescence of recent CVA, which presented with worsening weakness of LLE. Admitted 03/01/18 and found to have acute patchy infarcts in the posterior R ACA and posterior ACA/MCA watershed territory, new onset seizures and started on Keppra. Utimately dc'd home on 03/04/18. On 03/07/18 re-admitted due to worsening L LE weakness and blurry vision. MRI shows slight interval expansion/worsening of R ACA territory infarct as well as new patchy invovelment of R corpous callosum. Concern for R MCA disease.  Continued workup.   Precautions  Precautions Fall  Precaution Comments L sided weakness, blurry vision   Pain Assessment  Pain Assessment No/denies pain  Cognition  Arousal/Alertness Awake/alert  Behavior During Therapy Flat affect;WFL for tasks assessed/performed  Overall Cognitive Status Impaired/Different from baseline  Area of Impairment Attention;Problem solving;Following commands  Current Attention Level Sustained;Selective  Following Commands Follows one step commands consistently;Follows one step commands with increased time  Safety/Judgement Decreased awareness of safety;Decreased awareness of deficits  Problem Solving Slow processing;Decreased initiation;Difficulty sequencing;Requires verbal cues;Requires tactile cues  General Comments Pt requires a significant amount of time to process and initiate activity - appears apraxic   Difficult to assess due to Impaired communication  Upper Extremity Assessment  LUE Deficits / Details grossly 4-/5  Brusnnstrom stage V   Lower Extremity Assessment  Lower Extremity Assessment Defer to PT evaluation  ADL  Overall ADL's  Needs assistance/impaired  Toilet Transfer Moderate assistance;Stand-pivot;BSC  Toilet Transfer Details (indicate cue type and reason) Lt knee buckled with attempt to transfer to Lt.  Pt also with signficiant dificulty initiating movement on Lt - ? apraxia possibly compounded by inattention (difficult to determine   Functional mobility during ADLs Moderate assistance;+2 for physical assistance;+2 for safety/equipment  Bed Mobility  Overal bed mobility Needs Assistance  Bed Mobility Sit to Supine  Sit to supine Min guard  General bed mobility comments min to manage L LE, Initiation and follow through a problem. Cues to redirect to task.  Balance  Overall balance assessment Needs assistance  Sitting-balance support No upper extremity supported;Feet supported  Sitting balance-Leahy Scale Fair  Standing balance support Bilateral upper extremity supported;During functional activity  Standing balance-Leahy Scale Poor   Standing balance comment reliant on B UE and external support statically  Vision- Assessment  Additional Comments difficult to fully assess due  to difficulty sustaining attention and initiating activity   Transfers  Overall transfer level Needs assistance  Equipment used 2 person hand held assist;1 person hand held assist  Transfers Sit to/from [employer] Transfers  Sit to Stand Mod assist;+2 physical assistance;+2 safety/equipment  Stand pivot transfers Mod assist;+2 safety/equipment;+2 physical assistance  General transfer comment mod assist to power up into standing with cueing for hand placement and safety, decreased initation and motor planning with decreased awareness to mgmt of L side  General Comments  General comments (skin integrity, edema, etc.) husband present during session   OT - End of Session  Equipment Utilized During Treatment Gait belt  Activity Tolerance Patient tolerated treatment well  Patient left in bed;with call bell/phone within reach;with family/visitor present  Nurse Communication Mobility status  OT Assessment/Plan  OT Plan Discharge plan remains appropriate  OT Visit Diagnosis Unsteadiness on feet (R26.81);Hemiplegia and hemiparesis  Symptoms and signs involving cognitive functions Cerebral infarction  Hemiplegia - Right/Left Left  Hemiplegia - dominant/non-dominant Non-Dominant  Hemiplegia - caused by Cerebral infarction  OT Frequency (ACUTE ONLY) Min 3X/week  Recommendations for Other Services Rehab consult  Follow Up Recommendations CIR  OT Equipment None recommended by OT  AM-PAC OT "6 Clicks" Daily Activity Outcome Measure  Help from another person eating meals? 3  Help from another person taking care of personal grooming? 3  Help from another person toileting, which includes using toliet, bedpan, or urinal? 1  Help from another person bathing (including washing, rinsing, drying)? 2  Help from another person to put on and taking off regular  upper body clothing? 3  Help from another person to put on and taking off regular lower body clothing? 1  6 Click Score 13  ADL G Code Conversion CL  OT Goal Progression  Progress towards OT goals Progressing toward goals  OT Time Calculation  OT Start Time (ACUTE ONLY) 1251  OT Stop Time (ACUTE ONLY) 1328  OT Time Calculation (min) 37 min  OT General Charges  $OT Visit 1 Visit  OT Treatments  $Neuromuscular Re-education 8-22 mins  Jeani Hawking, OTR/L Acute Rehabilitation Services Pager [phone] Office [phone]

## 2018-03-11 NOTE — Progress Notes (Signed)
PMR Admission Coordinator Pre-Admission Assessment  Patient: [name] is an 52 y.o., female MRN: [id number] DOB: 1965/10/19 Height: 5' 5"  (165.1 cm) Weight: 102.4 kg                                                                                                                                                  Insurance Information HMO:     PPO: Yes     PCP:      IPA:      80/20:      OTHER:  PRIMARY: [employer]      Policy#: [id number]      Subscriber: [place] CM Name: Abigail Butts       Phone#: [phone]     Fax#: [phone] Pre-Cert#: [id number]      Employer:  Josem Kaufmann provided by Lattie Haw at [facility] for admit to CIR on 03/11/18; Clinical updates are due to Abigail Butts in 5 days (03/15/18). Benefits:  Phone #: [phone]     Name: Ozzie Hoyle. Date: 04/20/17     Deduct: $3,500 (met: $3,500)      Out of Pocket Max: $7,350 (met $5,358.72) includes deductible     Life Max:  CIR: 80%/20%      SNF: 80%/20%; 60 days Outpatient: 80% (60 visits comb PT/OT/ST)     Co-Pay: 20% Home Health: 80%; per necessity      Co-Pay: 20% DME: 80%     Co-Pay: 20% Providers:  SECONDARY:       Policy#:       Subscriber:  CM Name:       Phone#:      Fax#:  Pre-Cert#:       Employer:  Benefits:  Phone #:      Name:  Eff. 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 She was discharged to home with home health services after declining skilled nursing facility ambulating minimal assistance 80 feet with a rolling walker. She lives with her husband who works during the day. Patient [facility]/08/2018 with increasing left lower extremity weakness and blurred vision. Cranial CT scan completed in the ED showing subacute infarction right ACA territory remote right occipital and inferior cerebellar infarct without intracranial hemorrhage. MRI showed slight interval expansion worsening of the right ACA territory infarction as compared to previous studies. CTA concerning for significant right MCA disease with neurology consulted as well as interventional radiology and patient was [facility] evaluation.Cerebral angiogram completed 03/09/2018 showing occluded right ACA A1/2 junction. 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 Neurology follow-up currently planned is to continue aspirin and Plavix therapy and follow-up 3 months with interventional radiology for repeat arteriogram. Therapy evaluations completed with recommendations of physical medicine rehabilitation consult. Patient is to be admitted for a comprehensive rehabilitation program on 03/11/18. Complete NIHSS TOTAL: 6  Past Medical History      Past Medical History:  Diagnosis Date  . Anxiety   . Chronic kidney disease   . Diabetes mellitus without complication ([place])   . Fibromyalgia   . Headache   . History of  kidney stones   . Hyperlipemia   . Hypertension   . Hypothyroidism   . Insomnia   . Pancreatitis    chronic  . Sleep apnea   . Stroke (cerebrum) [facility])     Family History  family history is not on file.  Prior Rehab/Hospitalizations:  Has the patient had major surgery during 100 days prior to admission? No  Current Medications   Current Facility-Administered Medications:  .  acetaminophen (TYLENOL) tablet 650 mg, 650 mg, Oral, Q6H PRN, 650 mg at 03/09/18 1715 **OR** acetaminophen (TYLENOL) suppository 650 mg, 650 mg, Rectal, Q6H PRN, Reubin Milan, MD .  aspirin EC tablet 81 mg, 81 mg, Oral, Daily, Reubin Milan, MD, 81 mg at 03/11/18 0831 .  atorvastatin (LIPITOR) tablet 80 mg, 80 mg, Oral, QHS, Reubin Milan, MD, 80 mg at 03/10/18 2156 .  bisacodyl (DULCOLAX) suppository 10 mg, 10 mg, Rectal, Daily PRN, Reubin Milan, MD .  DULoxetine (CYMBALTA) DR capsule 60 mg, 60 mg, Oral, Daily, Reubin Milan, MD, 60 mg at 03/11/18 0831 .  insulin aspart (novoLOG) injection 0-20 Units, 0-20 Units, Subcutaneous, TID WC, Reubin Milan, MD, 4 Units at 03/11/18 [phone] .  insulin glargine (LANTUS) injection 30 Units, 30 Units, Subcutaneous, QHS, Reubin Milan, MD, 30 Units at 03/10/18 2311 .  levETIRAcetam (KEPPRA) tablet 500 mg, 500 mg, Oral, BID, Reubin Milan, MD, 500 mg at 03/11/18 0831 .  ondansetron (ZOFRAN) tablet 4 mg, 4 mg, Oral, Q6H PRN **OR** ondansetron (ZOFRAN) injection 4 mg, 4 mg, Intravenous, Q6H PRN, Reubin Milan, MD .  QUEtiapine (SEROQUEL) tablet 50 mg, 50 mg, Oral, QHS, Reubin Milan, MD, 50 mg at 03/10/18 2156 .  senna-docusate (Senokot-S) tablet 1 tablet, 1 tablet, Oral, QHS PRN, Reubin Milan, MD  Patients Current Diet:     Diet Order                  Diet Carb Modified Fluid consistency: Thin;  Room service appropriate? Yes  Diet effective now               Precautions /  Restrictions Precautions Precautions: Fall Precaution Comments: L sided weakness, blurry vision Restrictions Weight Bearing Restrictions: No   Has the patient had 2 or more falls or a fall with injury in the past year?Yes  Prior Activity Level Community (5-7x/wk): worked full time at [employer]; drove PTA; Independent PTA  Home Assistive Devices / Equipment Home Assistive Devices/Equipment: Gilford Rile (specify type) Home Equipment: Walker - 2 wheels, Grab bars - toilet, Grab bars - tub/shower  Prior Device Use: Indicate devices/aids used by the patient prior to current illness, exacerbation or injury? None of the above  Prior Functional Level Prior Function Level of Independence: Independent Comments: pt reports prior to first CVA independent, after previous admission requires some assist but husband works and was using RW for mobility during day independently   Self Care: Did the patient need help bathing, dressing, using the toilet or eating?  Independent  Indoor Mobility: Did the patient need assistance with walking from room to room (with or without device)? Independent  Stairs: Did the patient need assistance with internal or external stairs (with or without device)? Independent  Functional Cognition: Did the patient need help planning regular tasks such as shopping or remembering to take medications? Independent  Current Functional Level Cognition  Arousal/Alertness: Awake/alert Overall Cognitive Status: Impaired/Different from baseline Current Attention Level: Sustained Orientation Level: Oriented X4 Following Commands: Follows one step commands with increased time Safety/Judgement: Decreased awareness of safety, Decreased awareness of deficits General Comments: Slow responses and limited verbalizations, especially when focused on single task. Required frequent, multimodal cues for completing tasks with L-side. Very pleasant and willing to  participate Attention: Sustained Sustained Attention: Appears intact Memory: Impaired(history of baseline impairments) Memory Impairment: Decreased short term memory Awareness: Appears intact Executive Function: (at baseline per chart review)    Extremity Assessment (includes Sensation/Coordination)  Upper Extremity Assessment: LUE deficits/detail LUE Deficits / Details: Grossly 3+/5 MMT  LUE Sensation: (to be assessed) LUE Coordination: decreased fine motor, decreased gross motor  Lower Extremity Assessment: LLE deficits/detail LLE Deficits / Details: Slow to initiate resistance, hip flexion, hams 3+, quads >3+ LLE Sensation: decreased light touch LLE Coordination: decreased fine motor    ADLs  Overall ADL's : Needs assistance/impaired Grooming: Supervision/safety, Set up, Sitting Upper Body Bathing: Set up, Sitting Lower Body Bathing: Maximal assistance, Sit to/from stand Upper Body Dressing : Minimal assistance, Sitting Lower Body Dressing: Total assistance, Sit to/from stand Toilet Transfer: Moderate assistance, BSC, RW, Cueing for sequencing, Cueing for safety, Stand-pivot Toileting- Clothing Manipulation and Hygiene: Moderate assistance, +2 for safety/equipment, Sit to/from stand, +2 for physical assistance Toileting - Clothing Manipulation Details (indicate cue type and reason): +2 to manage clothing while therapist mainatins balance Functional mobility during ADLs: Moderate assistance, Rolling walker, Cueing for safety, Cueing for sequencing General ADL Comments: Pt limited by L inattention, L sided weakness, impaired cognition, decreased initation and motor planning.     Mobility  Overal bed mobility: Needs Assistance Bed Mobility: Sit to Supine Supine to sit: Mod assist Sit to supine: Min assist General bed mobility comments: MinA to manage LEs and pad placement    Transfers  Overall transfer level: Needs assistance Equipment used: 1 person hand held assist,  2 person hand held assist Transfers: Sit to/from Stand Sit to Stand: Mod assist, +2 safety/equipment Stand pivot transfers: Mod assist General transfer comment: ModA+2 to assist trunk  elevation and maintain balance; pt reaching for RUE support to keep balance. Decreased initiation and motor planning requiring cues    Ambulation / Gait / Stairs / Wheelchair Mobility  Ambulation/Gait Ambulation/Gait assistance: Mod assist, Max assist, +2 physical assistance, +2 safety/equipment Gait Distance (Feet): 20 Feet Assistive device: 1 person hand held assist, 2 person hand held assist Gait Pattern/deviations: Step-to pattern, Decreased step length - left, Decreased dorsiflexion - left, Decreased weight shift to right, Scissoring, Narrow base of support General Gait Details: Prolonged time spent standing at EOB working on standing and dynamic balance prior to walking. Amb 10' forwards and 10' backwards, requiring mod-maxA+2 and bilateral HHA to maintain balance; pt did better initiating steps with LLE after performing partial squat. Multimodal cues for sequencing throughout. Difficulty taking complete step with LLE, at times requiring maxA to assist step. With fatigue, began to shuffle bilateral feet. L lateral lean Gait velocity: decreased Gait velocity interpretation: <1.31 ft/sec, indicative of household ambulator    Posture / Balance Balance Overall balance assessment: Needs assistance Sitting-balance support: No upper extremity supported, Feet supported Sitting balance-Leahy Scale: Fair Standing balance support: Bilateral upper extremity supported, During functional activity Standing balance-Leahy Scale: Poor Standing balance comment: Reliant on UE support and external assist for static and dynamic standing    Special needs/care consideration BiPAP/CPAP: yes, CPAP at night per pt report CPM: no Continuous Drip IV: no Dialysis: no        Days: no Life Vest: no Oxygen: no Special Bed:  no Trach Size: no Wound Vac (area): no      Location: no Skin: ecchymosis BUEs                            Bowel mgmt:continent, last BM: 03/08/18 Bladder mgmt:pt required In & Out Cath on 03/11/18 as pt was unable to void despite full bladder Diabetic mgmt: yes     Previous Home Environment Living Arrangements: Children, Spouse/significant other  Lives With: Spouse Available Help at Discharge: Family, Available PRN/intermittently Type of Home: House Home Layout: One level Home Access: Stairs to enter Entrance Stairs-Rails: Left Entrance Stairs-Number of Steps: 4 STE  Bathroom Shower/Tub: Gaffer, Architectural technologist: [place]: Yes Type of Home Care Services: Homehealth aide, Home OT, Home PT  Discharge Living Setting Plans for Discharge Living Setting: Patient's home, Lives with (comment)(husband and two grown children) Type of Home at Discharge: House Discharge Home Layout: One level Discharge Home Access: Stairs to enter Entrance Stairs-Rails: Can reach both Entrance Stairs-Number of Steps: 4 Discharge Bathroom Shower/Tub: Walk-in shower Discharge Bathroom Toilet: Standard Discharge Bathroom Accessibility: Yes How Accessible: Accessible via walker Does the patient have any problems obtaining your medications?: No  Social/Family/Support Systems Patient Roles: Spouse, Parent, Other (Comment)(worked full time. ) Contact Information: husband Mitzi Hansen): [phone] Anticipated Caregiver: husband, two children (grown), and chuch friends Anticipated Ambulance person Information: see above Ability/Limitations of Caregiver: Min A Caregiver Availability: Other (Comment)(close to 24/7 A) Discharge Plan Discussed with Primary Caregiver: Yes Is Caregiver In Agreement with Plan?: Yes Does Caregiver/Family have Issues with Lodging/Transportation while Pt is in Rehab?: No   Goals/Additional Needs Patient/Family Goal for Rehab: PT:  Supervision; OT: Supervision/Min A; SLP: Supervision Expected length of stay: 15-23 days Cultural Considerations: NA Dietary Needs: Carb Modified; thin liquids (medium calorie level 1600-2000) Equipment Needs: TBD Pt/Family Agrees to Admission and willing to participate: Yes Program Orientation Provided & Reviewed with Pt/Caregiver Including Roles  & Responsibilities:  Yes(with pt and husband)  Barriers to Discharge: Home environment access/layout, Lack of/limited family support  Barriers to Discharge Comments: pt should have close to 24/7 A. has steps to enter;    Decrease burden of Care through IP rehab admission: NA    Possible need for SNF placement upon discharge: Not anticipated; pt has good social support at DC and has excellent prognosis for further progress.    Patient Condition: This patient's medical and functional status has changed since the consult dated: 03/10/18 in which the Rehabilitation Physician determined and documented that the patient's condition is appropriate for intensive rehabilitative care in an inpatient rehabilitation facility. Medical changes are: no new changes; see HPI for information.  Functional changes are: progression from Mod A supine to sit EOB to Min A EOB, decline in functional transfers from  Mod A transfers to mod A x2, and a decrease in functional mobility from Mod A 80 feet on evaluation to Mod/Max A x 2 for 20 feet . After evaluating the patient today and speaking with the Rehabilitation physician and acute team, the patient remains appropriate for inpatient rehab. Will admit to inpatient rehab today.  Preadmission Screen Completed By:  Jhonnie Garner, 03/11/2018 10:34 AM ______________________________________________________________________   Discussed status with Dr. Naaman Plummer on 03/11/18 at 11:20AM and received telephone approval for admission today.  Admission Coordinator:  Jhonnie Garner, time 11:20AM/Date 03/11/18.           Cosigned  by: Meredith Staggers, MD at 03/11/2018 11:32 AM  Revision History

## 2018-03-11 NOTE — Discharge Summary (Signed)
Physician Discharge Summary  Byrd Terrero TDH:[id number] DOB: 01-01-1966 DOA: 03/08/2018  PCP: Lillard Anes, MD  Admit date: 03/08/2018 Discharge date: 03/11/2018  Time spent: 35 minutes  Recommendations for Outpatient Follow-up:  1. Recommend slight titration of Lantus to 34 units daily and sliding scale coverage 2. Needs to be on aspirin Plavix for the next 3 months and then needs follow-up with Dr. Abram Sander 3. 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), fibromyalgia, severe hyperlipidemia and hypertriglyceridemia, seizure disorder, morbid obesity BMI 39, noncompliance with meds, Recent admission 1113-03/04/2018 with acute R ACA infarct secondary to large vessel disease-work-up at the time was unable to be completed with TEE patient was sent home on aspirin Plavix and then changed to Plavix alone after 3 months-was started on Keppra as well because she had new onset seizures-declined skilled placement  Presented back to hospital on 11/19 from [facility] with worsening of left lower extremity weakness-code stroke called CT showed worsening of our ACA infarct compared to prior and new patchy involvement right corpus callosum  On admission creatinine up from 1.4-1.7 AST ALT slightly elevated alk phos 259 troponin less than 1  Patient was transferred over to [facility] for IR evaluation performed on  11/20 IR performed cerebral angiogram as per below with as per results below  Patient stabilized after angiogram and it was felt that patient was a candidate for aspirin Plavix without any acute large intervention Patient will be discharging to inpatient rehab for further therapies and management  On discharge in about 1 week I would recommend a CBC as well as a complete metabolic panel   Because of her uncontrolled hyperglycemia with increased her Lantus from 30 to 34 units She presented with mild renal insufficiency and I have discontinued her hypertension in addition to her lisinopril labs as above   Procedures: Labs:  BUN/creatinine up from 1.4--20/1.6, CO2 19, CBC normal  Imaging studies: Cerebral angiogram1.Occluded RT ACA A1/2 junction. 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 3.Severe stenosis of RT MCA dominant inf division at its origin.  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 Studies not automatically included, echos, thoracentesis, etc; not x-rays)  Consultations:  Interventional radiology  Discharge Exam: Vitals:   03/11/18 0437 03/11/18 0747  BP: (!) 175/97 (!) 163/96  Pulse: 82   Resp: 20   Temp: 98.4 F (36.9 C) 97.6 F (36.4 C)  SpO2: 97%     General: Awake alert pleasant no distress moving all 4 limbs however little weaker on the left side Cardiovascular: S1-S2 no murmur rub or gallop Respiratory: Chest is clinically clear no added sound Neurologically intact power 5/5 slightly decreased sensation left side with decreased power as well   Discharge Instructions   Discharge Instructions    Diet - low sodium heart healthy   Complete by:  As directed    Increase activity slowly   Complete by:  As directed  Allergies as of 03/11/2018   No Known Allergies     Medication List    STOP taking these medications   chlorthalidone 25 MG tablet Commonly known as:  HYGROTON   Insulin Pen Needle 31G X 4 MM Misc    lisinopril 40 MG tablet Commonly known as:  PRINIVIL,ZESTRIL     TAKE these medications   acetaminophen 500 MG tablet Commonly known as:  TYLENOL Take 1,000 mg by mouth every 6 (six) hours as needed for mild pain or headache.   amLODipine 10 MG tablet Commonly known as:  NORVASC Take 10 mg by mouth daily.   aspirin 81 MG EC tablet Take 1 tablet (81 mg total) by mouth daily.   atorvastatin 80 MG tablet Commonly known as:  LIPITOR Take 1 tablet (80 mg total) by mouth at bedtime.   carvedilol 25 MG tablet Commonly known as:  COREG Take 25 mg by mouth 2 (two) times daily with a meal.   clopidogrel 75 MG tablet Commonly known as:  PLAVIX Take 1 tablet (75 mg total) by mouth daily.   DULoxetine 60 MG capsule Commonly known as:  CYMBALTA Take 60 mg by mouth daily.   hydrALAZINE 50 MG tablet Commonly known as:  APRESOLINE Take 50 mg by mouth 3 (three) times daily.   insulin aspart 100 UNIT/ML injection Commonly known as:  novoLOG Inject 5 Units into the skin 3 (three) times daily with meals.   insulin aspart 100 UNIT/ML FlexPen Commonly known as:  NOVOLOG Inject 1-15 Units into the skin 3 (three) times daily with meals. CBG 121 - 150: 2 units  CBG 151 - 200: 3 units  CBG 201 - 250: 5 units  CBG 251 - 300: 8 units  CBG 301 - 350: 11 units  CBG 351 - 400: 15 units   Insulin Glargine 100 UNIT/ML Solostar Pen Commonly known as:  LANTUS Inject 34 Units into the skin at bedtime. What changed:  how much to take   levETIRAcetam 500 MG tablet Commonly known as:  KEPPRA Take 1 tablet (500 mg total) by mouth 2 (two) times daily.   QUEtiapine 50 MG tablet Commonly known as:  SEROQUEL Take 50 mg by mouth at bedtime.   senna-docusate 8.6-50 MG tablet Commonly known as:  Senokot-S Take 1 tablet by mouth at bedtime as needed for moderate constipation.      No Known Allergies    The results of significant diagnostics from this hospitalization (including imaging,  microbiology, ancillary and laboratory) are listed below for reference.    Significant Diagnostic Studies: US Renal  Result Date: 03/10/2018 CLINICAL DATA:  Chronic kidney disease. History of kidney stones. Stroke, hypertension, diabetes. EXAM: RENAL / URINARY TRACT ULTRASOUND COMPLETE COMPARISON:  CT abdomen and pelvis 01/17/2014 FINDINGS: Right Kidney: Renal measurements: 11.2 x 5.5 x 5.5 cm = volume: 182 mL mL. Diffusely increased parenchymal echotexture consistent with chronic medical renal disease. Simple appearing cyst in the upper pole measuring 4.2 cm diameter. Cyst in the midpole measuring 3.1 cm diameter. Tiny cyst in the lower pole measuring 1.5 cm diameter. Cysts were present on previous CT. No hydronephrosis. Left Kidney: Renal measurements: 11.4 x 6.8 x 4.4 cm = volume: 182 mL mL. Diffusely increased parenchymal echotexture consistent with chronic medical renal disease. Small cyst in the midpole measuring 1.5 cm diameter. No hydronephrosis. Bladder: No wall thickening or filling defects identified. IMPRESSION: Bilateral increased renal parenchymal echotexture suggesting chronic medical renal disease. Small bilateral renal cysts, unchanged since previous CT.  No hydronephrosis. Electronically Signed   By: Lucienne Capers M.D.   On: 03/10/2018 22:14    Microbiology: No results found for this or any previous visit (from the past 240 hour(s)).   Labs: Basic Metabolic Panel: Recent Labs  Lab 03/09/18 1022 03/10/18 0442  NA 135 137  K 4.7 4.1  CL 107 108  CO2 19* 19*  GLUCOSE 199* 155*  BUN 24* 20  CREATININE 1.59* 1.67*  CALCIUM 8.7* 8.7*   Liver Function Tests: Recent Labs  Lab 03/09/18 1022  AST 59*  ALT 63*  ALKPHOS QUANTITY NOT SUFFICIENT, UNABLE TO PERFORM TEST  BILITOT 0.4  PROT QUANTITY NOT SUFFICIENT, UNABLE TO PERFORM TEST  ALBUMIN 2.6*   No results for input(s): LIPASE, AMYLASE in the last 168 hours. No results for input(s): AMMONIA in the last 168  hours. CBC: Recent Labs  Lab 03/09/18 0448 03/10/18 0442  WBC 7.9 8.9  NEUTROABS 4.4  --   HGB 13.6 13.8  HCT 43.5 41.9  MCV 87.9 86.6  PLT 323 262   Cardiac Enzymes: No results for input(s): CKTOTAL, CKMB, CKMBINDEX, TROPONINI in the last 168 hours. BNP: BNP (last 3 results) No results for input(s): BNP in the last 8760 hours.  ProBNP (last 3 results) No results for input(s): PROBNP in the last 8760 hours.  CBG: Recent Labs  Lab 03/10/18 0626 03/10/18 1101 03/10/18 1638 03/10/18 2117 03/11/18 0617  GLUCAP 143* 195* 234* 202* 178*       Signed:  Nita Sells MD   [facility] 03/11/2018, 10:46 AM

## 2018-03-11 NOTE — Progress Notes (Signed)
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 Patient lives with husband and he works during the day.Presented [facility] 03/08/2018 with increasing left lower extremity weakness and blurred vision.cranial CT scan completed in the ED showing subacute infarct right ACA territory remote right occipital and inferior cerebellar infarct without intracranial hemorrhage. MRI showed slight interval expansion worsening of right ACA territory infarction as compared to previous study.previous CTA concerning for significant right MCA disease with neurology consulted as well as interventional radiology recommendations transferred to [facility] for further evaluation.Cerebral angiogram completed 03/09/2018 showing occluded right ACA A1/2 junction. 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 Neurology follow-up with workup currently ongoing. Tolerating a regular diet. Therapy evaluation completed with recommendations of physical  medicine rehabilitation consult.     Review of Systems  Constitutional: Negative for chills and fever.  HENT: Negative for hearing loss.   Eyes: Positive for blurred vision.  Respiratory: Negative for cough and shortness of breath.   Cardiovascular: Positive for leg swelling. Negative for chest pain and palpitations.  Gastrointestinal: Positive for constipation. Negative for nausea and vomiting.  Genitourinary: Negative for dysuria, flank pain and hematuria.  Musculoskeletal: Positive for back pain and joint pain.  Skin: Negative for rash.  Neurological: Positive for focal weakness and headaches.  Psychiatric/Behavioral: The patient has insomnia.        Anxiety  All other systems reviewed and are negative.       Past Medical History:  Diagnosis Date  . Anxiety    . Chronic kidney disease    . Diabetes mellitus without complication (HCC)    . Fibromyalgia    . Headache    . History of kidney stones    . Hyperlipemia    . Hypertension    . Hypothyroidism    . Insomnia    . Pancreatitis      chronic  . Sleep apnea    . Stroke (cerebrum) [facility])           Past Surgical History:  Procedure Laterality Date  . ABDOMINAL HYSTERECTOMY      . CHOLECYSTECTOMY      . ROTATOR CUFF REPAIR Right    . SHOULDER SURGERY Left           Family History  Problem Relation Age of Onset  . Diabetes Neg Hx      Social History:  reports that she has never smoked. She has never used smokeless tobacco. She reports that she does not drink alcohol or use drugs. Allergies:  No Known Allergies       Medications Prior to Admission  Medication Sig Dispense Refill  . acetaminophen (TYLENOL) 500 MG tablet Take 1,000 mg by mouth every 6 (six) hours as needed for mild pain or headache.      Marland Kitchen. amLODipine (NORVASC) 10 MG tablet Take 10 mg by mouth daily.       Marland Kitchen. aspirin EC 81 MG EC tablet Take 1 tablet (81 mg total) by mouth daily. 30 tablet 0  . atorvastatin (LIPITOR) 80 MG tablet Take 1 tablet (80 mg  total) by mouth at bedtime. 30 tablet 0  . carvedilol (COREG) 25 MG tablet Take 25 mg by mouth 2 (two) times daily with a meal.       . chlorthalidone (HYGROTON) 25 MG tablet Take 25 mg by mouth daily.      . clopidogrel (PLAVIX) 75 MG tablet Take 1 tablet (75 mg total) by mouth daily. 30 tablet 0  . DULoxetine (CYMBALTA) 60 MG capsule Take 60 mg by mouth daily.      . hydrALAZINE (APRESOLINE) 50 MG tablet Take 50 mg by mouth 3 (three) times daily.      . insulin aspart (NOVOLOG FLEXPEN) 100 UNIT/ML FlexPen Inject 1-15 Units into the skin 3 (three) times daily with meals. CBG 121 - 150:   2 units     CBG 151 - 200:       3 units     CBG 201 - 250:       5 units     CBG 251 - 300:       8 units     CBG 301 - 350:       11 units     CBG 351 - 400:       15 units 15 mL 11  . insulin aspart (NOVOLOG) 100 UNIT/ML injection Inject 5 Units into the skin 3 (three) times daily with meals. 10 mL 11  . Insulin Glargine (LANTUS SOLOSTAR) 100 UNIT/ML Solostar Pen Inject 30 Units into the skin at bedtime. 15 mL 11  . Insulin Pen Needle 31G X 4 MM MISC 1 each by Does not apply route 4 (four) times daily -  before meals and at bedtime. 120 each 0  . levETIRAcetam (KEPPRA) 500 MG tablet Take 1 tablet (500 mg total) by mouth 2 (two) times daily. 60 tablet 0  . lisinopril (PRINIVIL,ZESTRIL) 40 MG tablet Take 40 mg by mouth daily.      . QUEtiapine (SEROQUEL) 50 MG tablet Take 50 mg by mouth at bedtime.      . senna-docusate (SENOKOT-S) 8.6-50 MG tablet Take 1 tablet by mouth at bedtime as needed for moderate constipation. 30 tablet 0      Home: Home Living Family/patient expects to be discharged to:: Private residence Living Arrangements: Children, Spouse/significant other Available Help at Discharge: Family, Available PRN/intermittently Type of Home: House Home Access: Stairs to enter Entergy CorporationEntrance Stairs-Number of Steps: 4 STE  Entrance Stairs-Rails: Left Home Layout: One level Bathroom Shower/Tub: English as a second language teacherWalk-in  shower, Curtain FirefighterBathroom Toilet: Standard Home Equipment: Environmental consultantWalker - 2 wheels, Grab bars - toilet, Grab bars - tub/shower  Lives With: Spouse  Functional History: Prior Function Level of Independence: Independent Comments: pt reports prior to first CVA independent, after previous admission requires some assist but husband works and was using RW for mobility during day independently  Functional Status:  Mobility: Bed Mobility Overal bed mobility: Needs Assistance Bed Mobility: Supine to Sit, Sit to Supine  Supine to sit: Mod assist Sit to supine: Min guard General bed mobility comments: min to manage L LE, Initiation and follow through a problem. Cues to redirect to task. Transfers Overall transfer level: Needs assistance Equipment used: Rolling walker (2 wheeled) Transfers: Sit to/from Stand, [employer] Transfers Sit to Stand: Mod assist Stand pivot transfers: Mod assist General transfer comment: mod assist to power up into standing with cueing for hand placement and safety, decreased initation and motor planning with decreased awareness to mgmt of L side Ambulation/Gait Ambulation/Gait assistance: Mod assist Gait Distance (Feet): 80 Feet Assistive device: Rolling walker (2 wheeled) Gait Pattern/deviations: Step-to pattern General Gait Details: Gait cadence slow with pt having trouble initiating toe off with follow through labored..  pt needing cues for posture, approp approximation to the RW and to keep focus.  Stability assist Gait velocity: decreased Gait velocity interpretation: <1.31 ft/sec, indicative of household ambulator   ADL: ADL Overall ADL's : Needs assistance/impaired Grooming: Supervision/safety, Set up, Sitting Upper Body Bathing: Set up, Sitting Lower Body Bathing: Maximal assistance, Sit to/from stand Upper Body Dressing : Minimal assistance, Sitting Lower Body Dressing: Total assistance, Sit to/from stand Toilet Transfer: Moderate assistance, BSC, RW, Cueing  for sequencing, Cueing for safety, Stand-pivot Toileting- Clothing Manipulation and Hygiene: Moderate assistance, +2 for safety/equipment, Sit to/from stand, +2 for physical assistance Toileting - Clothing Manipulation Details (indicate cue type and reason): +2 to manage clothing while therapist mainatins balance Functional mobility during ADLs: Moderate assistance, Rolling walker, Cueing for safety, Cueing for sequencing General ADL Comments: Pt limited by L inattention, L sided weakness, impaired cognition, decreased initation and motor planning.    Cognition: Cognition Overall Cognitive Status: History of cognitive impairments - at baseline Arousal/Alertness: Awake/alert Orientation Level: Oriented X4 Attention: Sustained Sustained Attention: Appears intact Memory: Impaired(history of baseline impairments) Memory Impairment: Decreased short term memory Awareness: Appears intact Executive Function: (at baseline per chart review) Cognition Arousal/Alertness: Awake/alert Behavior During Therapy: Flat affect, WFL for tasks assessed/performed Overall Cognitive Status: History of cognitive impairments - at baseline Area of Impairment: Attention, Memory, Following commands, Safety/judgement, Awareness, Problem solving Current Attention Level: Sustained Memory: Decreased recall of precautions, Decreased short-term memory Following Commands: Follows one step commands consistently, Follows one step commands with increased time Safety/Judgement: Decreased awareness of safety, Decreased awareness of deficits Awareness: Emergent Problem Solving: Slow processing, Decreased initiation, Difficulty sequencing, Requires verbal cues, Requires tactile cues General Comments: pt with slow responses and limited verbalizations throughout session; patients husband reports increased difficulty with memory and problem sovling since current admission   Blood pressure (!) 171/91, pulse 94, temperature 98.7 F  (37.1 C), temperature source Oral, resp. rate 18, height 5\' 5"  (1.651 m), weight 102.4 kg, SpO2 93 %. Physical Exam  Constitutional:  obese  HENT:  Head: Normocephalic.  Eyes: Pupils are equal, round, and reactive to light.  Neck: Normal range of motion.  Cardiovascular: Normal rate.  Respiratory: Effort normal.  GI: Soft.  Musculoskeletal: She exhibits edema.  Neurological:  Patient is alert complaints of headache. Follow simple commands. Provides her name and age. Slow to process. Word finding deficits. RUE 4/5. LUE 3+ to 4/5. RLE 4/5. LLE 2-3/5---inconsistent effort. Decreased sensation to LT/pain on left. Mild left inattention  Psychiatric:  anxious      Lab Results Last 24 Hours  Results for orders placed or performed during the hospital encounter of 03/08/18 (from the past 24 hour(s))  Glucose, capillary     Status: Abnormal    Collection Time: 03/09/18  6:44 AM  Result Value Ref Range    Glucose-Capillary 244 (H) 70 - 99 mg/dL    Comment 1 Notify RN      Comment 2 Document in Chart    APTT     Status: None    Collection Time: 03/09/18  7:43 AM  Result Value Ref Range    aPTT 27 24 - 36 seconds  Comprehensive metabolic panel     Status: Abnormal    Collection Time: 03/09/18 10:22 AM  Result Value Ref Range    Sodium 135 135 - 145 mmol/L    Potassium 4.7 3.5 - 5.1 mmol/L    Chloride 107 98 - 111 mmol/L    CO2 19 (L) 22 - 32 mmol/L    Glucose, Bld 199 (H) 70 - 99 mg/dL    BUN 24 (H) 6 - 20 mg/dL    Creatinine, Ser 0.98 (H) 0.44 - 1.00 mg/dL    Calcium 8.7 (L) 8.9 - 10.3 mg/dL    Total Protein QUANTITY NOT SUFFICIENT, UNABLE TO PERFORM TEST 6.5 - 8.1 g/dL    Albumin 2.6 (L) 3.5 - 5.0 g/dL    AST 59 (H) 15 - 41 U/L    ALT 63 (H) 0 - 44 U/L    Alkaline Phosphatase QUANTITY NOT SUFFICIENT, UNABLE TO PERFORM TEST 38 - 126 U/L    Total Bilirubin 0.4 0.3 - 1.2 mg/dL    GFR calc non Af Amer 37 (L) >60 mL/min    GFR calc Af Amer 43 (L) >60 mL/min    Anion gap 9 5 - 15    Glucose, capillary     Status: Abnormal    Collection Time: 03/09/18 11:14 AM  Result Value Ref Range    Glucose-Capillary 206 (H) 70 - 99 mg/dL  Glucose, capillary     Status: Abnormal    Collection Time: 03/09/18  4:47 PM  Result Value Ref Range    Glucose-Capillary 186 (H) 70 - 99 mg/dL  Glucose, capillary     Status: Abnormal    Collection Time: 03/09/18  9:49 PM  Result Value Ref Range    Glucose-Capillary 221 (H) 70 - 99 mg/dL    Comment 1 Notify RN      Comment 2 Document in Chart    CBC     Status: None    Collection Time: 03/10/18  4:42 AM  Result Value Ref Range    WBC 8.9 4.0 - 10.5 K/uL    RBC 4.84 3.87 - 5.11 MIL/uL    Hemoglobin 13.8 12.0 - 15.0 g/dL    HCT 11.9 14.7 - 82.9 %    MCV 86.6 80.0 - 100.0 fL    MCH 28.5 26.0 - 34.0 pg    MCHC 32.9 30.0 - 36.0 g/dL    RDW 56.2 13.0 - 86.5 %    Platelets 262 150 - 400 K/uL    nRBC 0.0 0.0 - 0.2 %      Imaging Results (Last 48 hours)  No results found.       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 Does the need for close, 24 hr/day medical supervision in concert with the patient's rehab needs make it unreasonable for this patient to be served in a less intensive setting? Yes 2. Co-Morbidities requiring supervision/potential complications: HTN, OSA, DM, CKDII, FMS, prior CVA 3. Due to bladder management, bowel management, safety, skin/wound care, disease management, medication administration, pain management and patient education, does the  patient require 24 hr/day rehab nursing? Yes 4. Does the patient require coordinated care of a physician, rehab nurse, PT (1-2 hrs/day, 5 days/week), OT (1-2 hrs/day, 5 days/week) and SLP (1-2 hrs/day, 5 days/week) to address physical and functional deficits in the context of the above medical diagnosis(es)? Yes Addressing deficits in the following areas: balance, endurance, locomotion, strength, transferring,  bowel/bladder control, bathing, dressing, feeding, grooming, toileting, cognition, language and psychosocial support 5. Can the patient actively participate in an intensive therapy program of at least 3 hrs of therapy per day at least 5 days per week? Yes 6. The potential for patient to make measurable gains while on inpatient rehab is excellent 7. Anticipated functional outcomes upon discharge from inpatient rehab are supervision  with PT, supervision and min assist with OT, supervision with SLP. 8. Estimated rehab length of stay to reach the above functional goals is: 15-23 days 9. Anticipated D/C setting: Home 10. Anticipated post D/C treatments: HH therapy 11. Overall Rehab/Functional Prognosis: excellent   RECOMMENDATIONS: This patient's condition is appropriate for continued rehabilitative care in the following setting: CIR Patient has agreed to participate in recommended program. Yes Note that insurance prior authorization may be required for reimbursement for recommended care.   Comment: Rehab Admissions Coordinator to follow up.   Thanks,   Ranelle Oyster, MD, [place] [facility]   I have personally performed a face to face diagnostic evaluation of this patient. Additionally, I have reviewed and concur with the physician assistant's documentation above.     Mcarthur Rossetti Angiulli, PA-C 03/10/2018        Revision History                       Routing History

## 2018-03-11 NOTE — Progress Notes (Signed)
Inpatient Rehabilitation-Admissions Coordinator   [facility]. AC has updated CM/SW, RN, and family regarding plan.   Please call if questions.   Nanine MeansKelly Reniah Cottingham, OTR/L  Rehab Admissions Coordinator  [phone] 03/11/2018 11:20 AM

## 2018-03-11 NOTE — Evaluation (Signed)
Speech Language Pathology Assessment and Plan  Patient Details  Name: [name] MRN: [id number] Date of Birth: 1965-04-22  SLP Diagnosis: Cognitive Impairments  Rehab Potential: Fair ELOS: 1-2 additional visits    Today's Date: 03/11/2018 SLP Individual Time: 14-1540 SLP Individual Time Calculation (min): 10 min   Problem List:  Patient Active Problem List   Diagnosis Date Noted  . Thrombotic cerebral infarction ([place]) 03/11/2018  . Seizures ([place]) 03/08/2018  . Non compliance w medication regimen   . Dyslipidemia 03/03/2018  . Acute CVA (cerebrovascular accident) ([place]) 03/02/2018  . Type 2 diabetes mellitus ([place]) 03/25/2015  . Hypothyroidism 03/25/2015  . HTN (hypertension) 03/25/2015  . RLS (restless legs syndrome) 03/25/2015  . History of stroke 11/30/2014  . Obesity, morbid, BMI 40.0-49.9 ([place]) 11/30/2014  . Pure hypertriglyceridemia 11/30/2014   Past Medical History:  Past Medical History:  Diagnosis Date  . Anxiety   . Chronic kidney disease   . Diabetes mellitus without complication ([place])   . Fibromyalgia   . Headache   . History of kidney stones   . Hyperlipemia   . Hypertension   . Hypothyroidism   . Insomnia   . Pancreatitis    chronic  . Sleep apnea   . Stroke (cerebrum) ([place])   . Thrombotic cerebral infarction [facility]) 03/11/2018   Past Surgical History:  Past Surgical History:  Procedure Laterality Date  . ABDOMINAL HYSTERECTOMY    . CHOLECYSTECTOMY    . ROTATOR CUFF REPAIR Right   . SHOULDER SURGERY Left     Assessment / Plan / Recommendation Clinical Impression   [name] is a 52 year old right-handed female with history of hypertension, OSA with CPAP, diabetes mellitus, hyperlipidemia, CKD stage II, fibromyalgia, previous CVA 2014 with residual left-sided weakness and noncompliant with medications as well as recent admission 03/02/2018 to 03/04/2018 for increasing left side weakness as well as reported new onset seizure and was started  on Keppra.Marland Kitchen MRI at that time showed right posterior ACA territory infarctions and maintain on aspirin and Plavix.EEG was negative for seizure. She was discharged to home with home health services after declining skilled nursing facility ambulating minimal assistance 80 feet with a rolling walker. She lives with her husband who works during the day. Patient presented [facility] 03/08/2018 with increasing left lower extremity weakness and blurred vision. Cranial CT scan completed in the ED showing subacute infarction right ACA territory remote right occipital and inferior cerebellar infarct without intracranial hemorrhage. MRI showed slight interval expansion worsening of the right ACA territory infarction as compared to previous studies. CTA concerning for significant right MCA disease with neurology consulted as well as interventional radiology and patient was transferred to [facility] for further evaluation.Cerebral angiogram completed 03/09/2018 showing occluded right ACA A1/2 junction. 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 Neurology follow-up currently planned is to continue aspirin and Plavix therapy and follow-up 3 months with interventional radiology for repeat arteriogram. Therapy evaluations completed with recommendations of physical medicine rehabilitation consult. Patient was admitted for a comprehensive rehabilitation program. SLP evaluation was completed on 03/11/2018 with the following results:  Pt is a questionable historian regarding baseline level of function; however, PMR pre-admission paperwork indicates that pt was able to stay home by herself and was otherwise independent for most household ADLs.  Pt indicates that her husband would set up her pill box for her every week but she would take her medications.  She also endorses that  husband managed their finances.  Currently, pt has slowed processing, possible  left inattention (had decreased awareness of holding objects in her left hand, likely due to decreased sensation), and decreased recall of new information which could pose safety concerns in her home environment if left alone.  Pt is likely not far from her baseline based on reports from previous ST evaluation but may benefit from a couple of additional sessions for education and ongoing diagnostic treatment of cognition.  Do not anticipate ST needs at discharge.     Skilled Therapeutic Interventions          Cognitive-linguistic evaluation completed with results and recommendations reviewed with patient    SLP Assessment  Patient will need skilled [place] [facility] during CIR admission    Recommendations  Patient destination: Home Follow up Recommendations: None Equipment Recommended: None recommended by SLP    SLP Frequency 1 to 3 out of 7 days   SLP Duration  SLP Intensity  SLP Treatment/Interventions 1-2 additional visits  Minumum of 1-2 x/day, 30 to 90 minutes  Cognitive remediation/compensation;Cueing hierarchy;Functional tasks;Internal/external aids;Environmental controls;Patient/family education    Pain Pain Assessment Pain Scale: 0-10 Pain Score: 0-No pain  Prior Functioning Cognitive/Linguistic Baseline: Within functional limits Type of Home: House  Lives With: Spouse Available Help at Discharge: Family;Available PRN/intermittently Vocation: Unemployed  Short Term Goals: Week 1: SLP Short Term Goal 1 (Week 1): STG=LTG due to ELOS for ST  Refer to Care Plan for Long Term Goals  Recommendations for other services: None   Discharge Criteria: Patient will be discharged from SLP if patient refuses treatment 3 consecutive times without medical reason, if treatment goals not met, if there is a change in medical status, if patient makes no progress towards goals or if patient is discharged from hospital.  The above assessment, treatment plan,  treatment alternatives and goals were discussed and mutually agreed upon: by patient  Emilio Math 03/11/2018, 8:49 PM

## 2018-03-11 NOTE — Progress Notes (Signed)
Received pt. As a new admission.Pt. Was oriented to unit protocol and routine.Safety plan was explained,fall prevention plan was explained and sing.

## 2018-03-11 NOTE — Progress Notes (Signed)
Physical Therapy Treatment Patient Details Name: [name] MRN: [id number] DOB: Jan 08, 1966 Today's Date: 03/11/2018    History of Present Illness Pt is a 52 y.o. female with recent admission for ACA/MCA stroke (admitted 11/13-11/15/19), now admitted 03/08/18 with worsening LLE weakness and blurry vision. MRI showed slight interval expansion/worsening of R ACA territory infarct and new patchy involvement of R corpus callosum. CTA concerning for R MCA disease. 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 Neurology workup ongoing. PMH includes HTN, HLD, CKD, DM2, fibromyalgia, OSA, anxiety.    PT Comments    Pt performed gait training and functional mobility.  She remains to require moderate assistance to mobilize.  Pt continues to lack ability to perform mobility safety and requires max VCs throughout session.  I have discussed the patient's current level of function related to mobility and ambulation with the patient.  She acknowledges understanding of this and feel she cannot provide the level of care she patient will need at home.  Plan for CIR remains appropriate at this time with plan for admission today per charting.       Follow Up Recommendations  CIR;Supervision for mobility/OOB     Equipment Recommendations  (TBD at next venue)    Recommendations for Other Services Rehab consult     Precautions / Restrictions Precautions Precautions: Fall Precaution Comments: L sided weakness, blurry vision Restrictions Weight Bearing Restrictions: No    Mobility  Bed Mobility Overal bed mobility: Needs Assistance Bed Mobility: Supine to Sit     Supine to sit: Min assist     General bed mobility comments: Pt required min assistance to elevate trunk into sitting and advance B LEs to edge of bed.    Transfers Overall transfer level: Needs assistance Equipment used: Rolling walker (2 wheeled) Transfers: Sit to/from  Stand Sit to Stand: Mod assist;+2 safety/equipment         General transfer comment: Cues for R hand placement to push from seated surface,  Patient required cues to scoot forward to edge of bed pre-transfer.    Ambulation/Gait Ambulation/Gait assistance: Mod assist;+2 safety/equipment Gait Distance (Feet): 30 Feet Assistive device: Rolling walker (2 wheeled) Gait Pattern/deviations: Step-to pattern;Decreased step length - left;Decreased dorsiflexion - left;Scissoring;Narrow base of support;Decreased weight shift to right Gait velocity: decreased   General Gait Details: Pt required step by step VCs to progress gait training.  When not cued she would step forward with her RLE and drag LLE forward.  Pt required assistance to keep RW close to her person to improve stability and safety with use of RW.     Stairs             Wheelchair Mobility    Modified Rankin (Stroke Patients Only)       Balance Overall balance assessment: Needs assistance Sitting-balance support: No upper extremity supported;Feet supported Sitting balance-Leahy Scale: Fair       Standing balance-Leahy Scale: Poor                              Cognition Arousal/Alertness: Awake/alert Behavior During Therapy: WFL for tasks assessed/performed;Flat affect Overall Cognitive Status: Impaired/Different from baseline Area of Impairment: Attention;Following commands;Safety/judgement;Awareness;Problem solving                   Current Attention Level: Sustained Memory: Decreased recall of precautions;Decreased short-term memory Following Commands: Follows one step commands with increased time Safety/Judgement:  Decreased awareness of safety;Decreased awareness of deficits   Problem Solving: Slow processing;Decreased initiation;Difficulty sequencing;Requires verbal cues;Requires tactile cues General Comments: Slow responses and limited verbalizations, especially when focused on single  task. Required frequent, multimodal cues for completing tasks with L-side. Very pleasant and willing to participate      Exercises      General Comments        Pertinent Vitals/Pain Pain Assessment: No/denies pain Pain Location: headache  Pain Descriptors / Indicators: Discomfort;Constant Pain Intervention(s): Monitored during session    Home Living                      Prior Function            PT Goals (current goals can now be found in the care plan section) Acute Rehab PT Goals Patient Stated Goal: "To go to rehab today.  " Potential to Achieve Goals: Good Progress towards PT goals: Progressing toward goals    Frequency    Min 4X/week      PT Plan Current plan remains appropriate    Co-evaluation              AM-PAC PT "6 Clicks" Daily Activity  Outcome Measure  Difficulty turning over in bed (including adjusting bedclothes, sheets and blankets)?: A Little Difficulty moving from lying on back to sitting on the side of the bed? : Unable Difficulty sitting down on and standing up from a chair with arms (e.g., wheelchair, bedside commode, etc,.)?: Unable Help needed moving to and from a bed to chair (including a wheelchair)?: A Lot Help needed walking in hospital room?: A Lot Help needed climbing 3-5 steps with a railing? : A Lot 6 Click Score: 11    End of Session Equipment Utilized During Treatment: Gait belt Activity Tolerance: Patient tolerated treatment well Patient left: with call bell/phone within reach;with family/visitor present;in chair;with chair alarm set Nurse Communication: Mobility status PT Visit Diagnosis: Other abnormalities of gait and mobility (R26.89);Hemiplegia and hemiparesis Hemiplegia - Right/Left: Left Hemiplegia - dominant/non-dominant: Non-dominant Hemiplegia - caused by: Cerebral infarction     Time: 1034-1101 PT Time Calculation (min) (ACUTE ONLY): 27 min  Charges:  $Gait Training: 8-22 mins $Therapeutic  Activity: 8-22 mins                     Joycelyn RuaAimee [place], PTA Acute Rehabilitation Services Pager [phone] Office [phone]     Betul Brisky Artis DelayJ Augie Vane 03/11/2018, 11:56 AM

## 2018-03-11 NOTE — Progress Notes (Signed)
Ultrasound tech called RN to inform her pt bladder was noted to be full during scan; pt attempted to void in bedpan but unsuccessful; pt upon arrival to the unit was bladder scanned for 531ml and pt voiced discomfort from bladder. NP on-call notified and order for In & Out cath was received. Pt cath for 450ml with NT assistance; UA sent; pt in bed comfortably with call light within reach; will continue to closely monitor pt. Dionne BucyP. Amo Tatym Schermer RN

## 2018-03-11 NOTE — H&P (Signed)
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 She was discharged to home with home health services after declining skilled nursing facility ambulating minimal assistance 80 feet with a rolling walker. She lives with her husband who works during the day. Patient presented [facility] 03/08/2018 with increasing left lower extremity weakness and blurred vision. Cranial CT scan completed in the ED showing subacute infarction right ACA territory remote right occipital and inferior cerebellar infarct without intracranial hemorrhage. MRI showed slight interval expansion worsening of the right ACA territory infarction as compared to previous studies. CTA concerning for significant right MCA disease with neurology consulted as well as interventional radiology and patient was transferred to [facility] 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 Neurology follow-up currently planned is to continue aspirin and Plavix therapy and follow-up 3 months with interventional radiology for repeat arteriogram. Therapy evaluations completed with recommendations of physical medicine rehabilitation consult. Patient was admitted for a comprehensive rehabilitation program.   Review of Systems  Constitutional:  Negative for chills and fever.  HENT: Negative for hearing loss.   Eyes: Positive for blurred vision.  Respiratory: Negative for cough and shortness of breath.   Cardiovascular: Positive for leg swelling.  Gastrointestinal: Positive for constipation. Negative for nausea and vomiting.  Genitourinary: Negative for dysuria, flank pain and hematuria.  Musculoskeletal: Positive for joint pain and myalgias.  Skin: Negative for rash.  Neurological: Positive for focal weakness and headaches.  Psychiatric/Behavioral: The patient has insomnia.        Anxiety  All other systems reviewed and are negative.   Past Medical History:  Diagnosis Date  . Anxiety    . Chronic kidney disease    . Diabetes mellitus without complication ([place])    . Fibromyalgia    . Headache    . History of kidney stones    . Hyperlipemia    . Hypertension    . Hypothyroidism    . Insomnia    . Pancreatitis      chronic  . Sleep apnea    . Stroke (cerebrum) [facility])           Past Surgical History:  Procedure Laterality Date  . ABDOMINAL HYSTERECTOMY      . CHOLECYSTECTOMY      . ROTATOR CUFF REPAIR Right    . SHOULDER SURGERY Left           Family History  Problem Relation Age of Onset  . Diabetes Neg Hx      Social History:  reports that she has never smoked. She has never used smokeless tobacco. She reports that she does not drink alcohol or use drugs. Allergies: No Known Allergies       Medications Prior to Admission  Medication Sig Dispense Refill  . acetaminophen (TYLENOL) 500 MG tablet Take 1,000 mg by mouth every 6 (six) hours as needed  for mild pain or headache.      Marland Kitchen amLODipine (NORVASC) 10 MG tablet Take 10 mg by mouth daily.       Marland Kitchen aspirin EC 81 MG EC tablet Take 1 tablet (81 mg total) by mouth daily. 30 tablet 0  . atorvastatin (LIPITOR) 80 MG tablet Take 1 tablet (80 mg total) by mouth at bedtime. 30 tablet 0  . carvedilol (COREG) 25 MG tablet Take 25 mg by mouth 2 (two) times daily with a  meal.       . chlorthalidone (HYGROTON) 25 MG tablet Take 25 mg by mouth daily.      . clopidogrel (PLAVIX) 75 MG tablet Take 1 tablet (75 mg total) by mouth daily. 30 tablet 0  . DULoxetine (CYMBALTA) 60 MG capsule Take 60 mg by mouth daily.      . hydrALAZINE (APRESOLINE) 50 MG tablet Take 50 mg by mouth 3 (three) times daily.      . insulin aspart (NOVOLOG FLEXPEN) 100 UNIT/ML FlexPen Inject 1-15 Units into the skin 3 (three) times daily with meals. CBG 121 - 150:   2 units     CBG 151 - 200:       3 units     CBG 201 - 250:       5 units     CBG 251 - 300:       8 units     CBG 301 - 350:       11 units     CBG 351 - 400:       15 units 15 mL 11  . insulin aspart (NOVOLOG) 100 UNIT/ML injection Inject 5 Units into the skin 3 (three) times daily with meals. 10 mL 11  . Insulin Glargine (LANTUS SOLOSTAR) 100 UNIT/ML Solostar Pen Inject 30 Units into the skin at bedtime. 15 mL 11  . Insulin Pen Needle 31G X 4 MM MISC 1 each by Does not apply route 4 (four) times daily -  before meals and at bedtime. 120 each 0  . levETIRAcetam (KEPPRA) 500 MG tablet Take 1 tablet (500 mg total) by mouth 2 (two) times daily. 60 tablet 0  . lisinopril (PRINIVIL,ZESTRIL) 40 MG tablet Take 40 mg by mouth daily.      . QUEtiapine (SEROQUEL) 50 MG tablet Take 50 mg by mouth at bedtime.      . senna-docusate (SENOKOT-S) 8.6-50 MG tablet Take 1 tablet by mouth at bedtime as needed for moderate constipation. 30 tablet 0      Drug Regimen Review Drug regimen was reviewed and remains appropriate with no significant issues identified   Home: Home Living Family/patient expects to be discharged to:: Private residence Living Arrangements: Children, Spouse/significant other Available Help at Discharge: Family, Available PRN/intermittently Type of Home: House Home Access: Stairs to enter [employer] of Steps: 4 STE  Entrance Stairs-Rails: Left Home Layout: One level Bathroom Shower/Tub: Gaffer,  Curtain Biochemist, clinical: Standard Home Equipment: Environmental consultant - 2 wheels, Grab bars - toilet, Grab bars - tub/shower  Lives With: Spouse   Functional History: Prior Function Level of Independence: Independent Comments: pt reports prior to first CVA independent, after previous admission requires some assist but husband works and was using RW for mobility during day independently    Functional Status:  Mobility: Bed Mobility Overal bed mobility: Needs Assistance Bed Mobility: Supine to Sit, Sit to Supine Supine to sit: Mod assist Sit to supine: Min guard General bed mobility comments: min to  manage L LE, Initiation and follow through a problem. Cues to redirect to task. Transfers Overall transfer level: Needs assistance Equipment used: Rolling walker (2 wheeled) Transfers: Sit to/from Stand, [employer] Transfers Sit to Stand: Mod assist Stand pivot transfers: Mod assist General transfer comment: mod assist to power up into standing with cueing for hand placement and safety, decreased initation and motor planning with decreased awareness to mgmt of L side Ambulation/Gait Ambulation/Gait assistance: Mod assist Gait Distance (Feet): 80 Feet Assistive device: Rolling walker (2 wheeled) Gait Pattern/deviations: Step-to pattern General Gait Details: Gait cadence slow with pt having trouble initiating toe off with follow through labored..  pt needing cues for posture, approp approximation to the RW and to keep focus.  Stability assist Gait velocity: decreased Gait velocity interpretation: <1.31 ft/sec, indicative of household ambulator   ADL: ADL Overall ADL's : Needs assistance/impaired Grooming: Supervision/safety, Set up, Sitting Upper Body Bathing: Set up, Sitting Lower Body Bathing: Maximal assistance, Sit to/from stand Upper Body Dressing : Minimal assistance, Sitting Lower Body Dressing: Total assistance, Sit to/from stand Toilet Transfer: Moderate assistance, BSC, RW, Cueing for  sequencing, Cueing for safety, Stand-pivot Toileting- Clothing Manipulation and Hygiene: Moderate assistance, +2 for safety/equipment, Sit to/from stand, +2 for physical assistance Toileting - Clothing Manipulation Details (indicate cue type and reason): +2 to manage clothing while therapist mainatins balance Functional mobility during ADLs: Moderate assistance, Rolling walker, Cueing for safety, Cueing for sequencing General ADL Comments: Pt limited by L inattention, L sided weakness, impaired cognition, decreased initation and motor planning.    Cognition: Cognition Overall Cognitive Status: History of cognitive impairments - at baseline Arousal/Alertness: Awake/alert Orientation Level: Oriented X4 Attention: Sustained Sustained Attention: Appears intact Memory: Impaired(history of baseline impairments) Memory Impairment: Decreased short term memory Awareness: Appears intact Executive Function: (at baseline per chart review) Cognition Arousal/Alertness: Awake/alert Behavior During Therapy: Flat affect, WFL for tasks assessed/performed Overall Cognitive Status: History of cognitive impairments - at baseline Area of Impairment: Attention, Memory, Following commands, Safety/judgement, Awareness, Problem solving Current Attention Level: Sustained Memory: Decreased recall of precautions, Decreased short-term memory Following Commands: Follows one step commands consistently, Follows one step commands with increased time Safety/Judgement: Decreased awareness of safety, Decreased awareness of deficits Awareness: Emergent Problem Solving: Slow processing, Decreased initiation, Difficulty sequencing, Requires verbal cues, Requires tactile cues General Comments: pt with slow responses and limited verbalizations throughout session; patients husband reports increased difficulty with memory and problem sovling since current admission   Physical Exam: Blood pressure (!) 163/97, pulse 87,  temperature 98 F (36.7 C), temperature source Oral, resp. rate 20, height 5' 5"  (1.651 m), weight 102.4 kg, SpO2 96 %. Physical Exam  Constitutional:  obese  HENT:  Head: Normocephalic and atraumatic.  Eyes: Pupils are equal, round, and reactive to light. EOM are normal.  Neck: Normal range of motion. No tracheal deviation present. No thyromegaly present.  Cardiovascular: Normal rate and regular rhythm. Exam reveals no friction rub.  No murmur heard. Respiratory: Effort normal. No respiratory distress. She has no wheezes. She has no rales.  GI: Soft. She exhibits no distension. There is no tenderness.  Musculoskeletal:  Pt alert, follows simple commands. Slow to process and speak however. Able to answer basic biographical questions. Oriented to person, place, reason she's here. Mild left inattention, no field cuts.  RUE and RLE 4 to 4+/5 prox to distal. LUE: 3+ to 4/5 deltoid, biceps, triceps, wrist and hand. LLE: 4/5 HF, KE and ADF/PF. DTR's 1+ throughout, no resting tone.  Neurological:  She is alert.  Flat but appropriate. Slow to process with word finding difficulty  Skin: Skin is warm and dry.  Psychiatric:  Anxious but cooperative      Lab Results Last 48 Hours        Results for orders placed or performed during the hospital encounter of 03/08/18 (from the past 48 hour(s))  Glucose, capillary     Status: Abnormal    Collection Time: 03/08/18  3:19 PM  Result Value Ref Range    Glucose-Capillary 238 (H) 70 - 99 mg/dL    Comment 1 Notify RN      Comment 2 Document in Chart    Protime-INR     Status: None    Collection Time: 03/08/18  4:11 PM  Result Value Ref Range    Prothrombin Time 12.3 11.4 - 15.2 seconds    INR 0.92        Comment: Performed at [facility], [place] [address] [place], [place] [address]  Glucose, capillary     Status: Abnormal    Collection Time: 03/08/18  5:12 PM  Result Value Ref Range    Glucose-Capillary 182 (H) 70 - 99 mg/dL    Comment 1  Notify RN      Comment 2 Document in Chart    CBC WITH DIFFERENTIAL     Status: None    Collection Time: 03/09/18  4:48 AM  Result Value Ref Range    WBC 7.9 4.0 - 10.5 K/uL    RBC 4.95 3.87 - 5.11 MIL/uL    Hemoglobin 13.6 12.0 - 15.0 g/dL    HCT 43.5 36.0 - 46.0 %    MCV 87.9 80.0 - 100.0 fL    MCH 27.5 26.0 - 34.0 pg    MCHC 31.3 30.0 - 36.0 g/dL    RDW 12.8 11.5 - 15.5 %    Platelets 323 150 - 400 K/uL    nRBC 0.0 0.0 - 0.2 %    Neutrophils Relative % 56 %    Neutro Abs 4.4 1.7 - 7.7 K/uL    Lymphocytes Relative 30 %    Lymphs Abs 2.4 0.7 - 4.0 K/uL    Monocytes Relative 8 %    Monocytes Absolute 0.6 0.1 - 1.0 K/uL    Eosinophils Relative 5 %    Eosinophils Absolute 0.4 0.0 - 0.5 K/uL    Basophils Relative 1 %    Basophils Absolute 0.1 0.0 - 0.1 K/uL    Immature Granulocytes 0 %    Abs Immature Granulocytes 0.02 0.00 - 0.07 K/uL      Comment: Performed at [facility], [address] [place], [place] [address]  Hemoglobin A1c     Status: Abnormal    Collection Time: 03/09/18  4:48 AM  Result Value Ref Range    Hgb A1c MFr Bld 12.9 (H) 4.8 - 5.6 %      Comment: (NOTE) Pre diabetes:          5.7%-6.4% Diabetes:              >6.4% Glycemic control for   <7.0% adults with diabetes      Mean Plasma Glucose 323.53 mg/dL      Comment: Performed at [facility] [address], [place], [place] [address]  Lipid panel     Status: Abnormal    Collection Time: 03/09/18  4:48 AM  Result Value Ref Range    Cholesterol 313 (H) 0 -  200 mg/dL    Triglycerides 529 (H) <150 mg/dL    HDL 36 (L) >40 mg/dL    Total CHOL/HDL Ratio 8.7 RATIO    VLDL UNABLE TO CALCULATE IF TRIGLYCERIDE OVER 400 mg/dL 0 - 40 mg/dL    LDL Cholesterol UNABLE TO CALCULATE IF TRIGLYCERIDE OVER 400 mg/dL 0 - 99 mg/dL      Comment:        Total Cholesterol/HDL:CHD Risk Coronary Heart Disease Risk Table                     Men   Women  1/2 Average Risk   3.4   3.3  Average Risk       5.0    4.4  2 X Average Risk   9.6   7.1  3 X Average Risk  23.4   11.0        Use the calculated Patient Ratio above and the CHD Risk Table to determine the patient's CHD Risk.        ATP III CLASSIFICATION (LDL):  <100     mg/dL   Optimal  100-129  mg/dL   Near or Above                    Optimal  130-159  mg/dL   Borderline  160-189  mg/dL   High  >190     mg/dL   Very High Performed at [facility] [address], [place], [place] [address]    Glucose, capillary     Status: Abnormal    Collection Time: 03/09/18  6:44 AM  Result Value Ref Range    Glucose-Capillary 244 (H) 70 - 99 mg/dL    Comment 1 Notify RN      Comment 2 Document in Chart    APTT     Status: None    Collection Time: 03/09/18  7:43 AM  Result Value Ref Range    aPTT 27 24 - 36 seconds      Comment: Performed at [facility] [address] [place],  [address]  Comprehensive metabolic panel     Status: Abnormal    Collection Time: 03/09/18 10:22 AM  Result Value Ref Range    Sodium 135 135 - 145 mmol/L    Potassium 4.7 3.5 - 5.1 mmol/L    Chloride 107 98 - 111 mmol/L    CO2 19 (L) 22 - 32 mmol/L    Glucose, Bld 199 (H) 70 - 99 mg/dL    BUN 24 (H) 6 - 20 mg/dL    Creatinine, Ser 1.59 (H) 0.44 - 1.00 mg/dL    Calcium 8.7 (L) 8.9 - 10.3 mg/dL    Total Protein QUANTITY NOT SUFFICIENT, UNABLE TO PERFORM TEST 6.5 - 8.1 g/dL      Comment: K.GETZ,RN 03/09/18 1250 DAVISB    Albumin 2.6 (L) 3.5 - 5.0 g/dL    AST 59 (H) 15 - 41 U/L    ALT 63 (H) 0 - 44 U/L    Alkaline Phosphatase QUANTITY NOT SUFFICIENT, UNABLE TO PERFORM TEST 38 - 126 U/L      Comment: K.GETZ,RN 03/09/18 1250 DAVISB    Total Bilirubin 0.4 0.3 - 1.2 mg/dL    GFR calc non Af Amer 37 (L) >60 mL/min    GFR calc Af Amer 43 (L) >60 mL/min      Comment: (NOTE) The eGFR has been calculated using the CKD EPI equation. This  calculation has not been validated in all clinical situations. eGFR's persistently <60 mL/min signify possible  Chronic Kidney Disease.      Anion gap 9 5 - 15      Comment: Performed at [facility] [address] [place], [place] [address]  Glucose, capillary     Status: Abnormal    Collection Time: 03/09/18 11:14 AM  Result Value Ref Range    Glucose-Capillary 206 (H) 70 - 99 mg/dL  Glucose, capillary     Status: Abnormal    Collection Time: 03/09/18  4:47 PM  Result Value Ref Range    Glucose-Capillary 186 (H) 70 - 99 mg/dL  Glucose, capillary     Status: Abnormal    Collection Time: 03/09/18  9:49 PM  Result Value Ref Range    Glucose-Capillary 221 (H) 70 - 99 mg/dL    Comment 1 Notify RN      Comment 2 Document in Chart    Basic metabolic panel     Status: Abnormal    Collection Time: 03/10/18  4:42 AM  Result Value Ref Range    Sodium 137 135 - 145 mmol/L    Potassium 4.1 3.5 - 5.1 mmol/L    Chloride 108 98 - 111 mmol/L    CO2 19 (L) 22 - 32 mmol/L    Glucose, Bld 155 (H) 70 - 99 mg/dL    BUN 20 6 - 20 mg/dL    Creatinine, Ser 1.67 (H) 0.44 - 1.00 mg/dL    Calcium 8.7 (L) 8.9 - 10.3 mg/dL    GFR calc non Af Amer 35 (L) >60 mL/min    GFR calc Af Amer 40 (L) >60 mL/min      Comment: (NOTE) The eGFR has been calculated using the CKD EPI equation. This calculation has not been validated in all clinical situations. eGFR's persistently <60 mL/min signify possible Chronic Kidney Disease.      Anion gap 10 5 - 15      Comment: Performed at [facility] [address] [place] [address]  CBC     Status: None    Collection Time: 03/10/18  4:42 AM  Result Value Ref Range    WBC 8.9 4.0 - 10.5 K/uL    RBC 4.84 3.87 - 5.11 MIL/uL    Hemoglobin 13.8 12.0 - 15.0 g/dL    HCT 41.9 36.0 - 46.0 %    MCV 86.6 80.0 - 100.0 fL    MCH 28.5 26.0 - 34.0 pg    MCHC 32.9 30.0 - 36.0 g/dL    RDW 12.8 11.5 - 15.5 %    Platelets 262 150 - 400 K/uL    nRBC 0.0 0.0 - 0.2 %      Comment: Performed at [facility], [place] [address] [place], [place] [address]  Glucose,  capillary     Status: Abnormal    Collection Time: 03/10/18  6:26 AM  Result Value Ref Range    Glucose-Capillary 143 (H) 70 - 99 mg/dL    Comment 1 Notify RN      Comment 2 Document in Chart    Glucose, capillary     Status: Abnormal    Collection Time: 03/10/18 11:01 AM  Result Value Ref Range    Glucose-Capillary 195 (H) 70 - 99 mg/dL      Imaging Results (Last 48 hours)  No results found.           Medical Problem List  and Plan: 1.  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  DVT Prophylaxis/Anticoagulation: SCDs. 3. Pain Management:  Cymbalta 60 mg daily 4. Mood:  Seroquel 50 mg daily at bedtime 5. Neuropsych: This patient is capable of making decisions on her own behalf. 6. Skin/Wound Care:  Routine skin checks 7. Fluids/Electrolytes/Nutrition:  Routine and out's with follow-up chemistries 8. Seizure disorder. Keppra 500 mg twice a day 9.Diabetes mellitus with peripheral neuropathy. Hemoglobin A1c 12.9.Lantus insulin 30 units daily at bedtime. Check blood sugars before meals and at bedtime. Diabetic teaching 10. CKD stage II. Follow-up chemistries 11. Hypertension. Permissive hypertension. Patient on Norvasc 10 mg daily, Coreg 25 mg twice a day,hygroton 25 mg daily, hydralazine 50 mg 3 times a day, lisinopril 40 mg daily prior to admission. Resume as needed 12. Hyperlipidemia. Lipitor   Post Admission Physician Evaluation: 1. Functional deficits secondary  to right ACA infarct(s) with left hemiparesis. 2. Patient is admitted to receive collaborative, interdisciplinary care between the physiatrist, rehab nursing staff, and therapy team. 3. Patient's level of medical complexity and substantial therapy needs in context of that medical necessity cannot be provided at a lesser intensity of  care such as a SNF. 4. Patient has experienced substantial functional loss from his/her baseline which was documented above under the "Functional History" and "Functional Status" headings.  Judging by the patient's diagnosis, physical exam, and functional history, the patient has potential for functional progress which will result in measurable gains while on inpatient rehab.  These gains will be of substantial and practical use upon discharge  in facilitating mobility and self-care at the household level. 5. Physiatrist will provide 24 hour management of medical needs as well as oversight of the therapy plan/treatment and provide guidance as appropriate regarding the interaction of the two. 6. The Preadmission Screening has been reviewed and patient status is unchanged unless otherwise stated above. 7. 24 hour rehab nursing will assist with bladder management, bowel management, safety, skin/wound care, disease management, medication administration, pain management and patient education  and help integrate therapy concepts, techniques,education, etc. 8. PT will assess and treat for/with: Lower extremity strength, range of motion, stamina, balance, functional mobility, safety, adaptive techniques and equipment, NMR, visual-spatial awareness, ego support, family ed.   Goals are: supervision. 9. OT will assess and treat for/with: ADL's, functional mobility, safety, upper extremity strength, adaptive techniques and equipment, NMR, visual-spatial awareness, family ed.   Goals are: supervision to min assist. Therapy may proceed with showering this patient. 10. SLP will assess and treat for/with: cognition, communication.  Goals are: supervision. 11. Case Management and Social Worker will assess and treat for psychological issues and discharge planning. 12. Team conference will be held weekly to assess progress toward goals and to determine barriers to discharge. 13. Patient will receive at least 3 hours of  therapy per day at least 5 days per week. 14. ELOS: 15-22 days       15. Prognosis:  excellent     I have personally performed a face to face diagnostic evaluation of this patient and formulated the key components of the plan.  Additionally, I have personally reviewed laboratory data, imaging studies, as well as relevant notes and concur with the physician assistant's documentation above.   Meredith Staggers, MD, FAAPMR     Lavon Paganini [place], PA-C 03/10/2018  The patient's status has not changed. The original post  admission physician evaluation remains appropriate, and any changes from the pre-admission screening or documentation from the acute chart are noted above.  Meredith Staggers, MD 03/11/2018

## 2018-03-11 NOTE — PMR Pre-admission (Signed)
PMR Admission Coordinator Pre-Admission Assessment  Patient: [name] is an 52 y.o., female MRN: [id number] DOB: 02-Dec-1965 Height: 5' 5"  (165.1 cm) Weight: 102.4 kg              Insurance Information HMO:     PPO: Yes     PCP:      IPA:      80/20:      OTHER:  PRIMARY: [employer]      Policy#: [id number]      Subscriber: Pateint CM Name: Abigail Butts       Phone#: [phone]     Fax#: [phone] Pre-Cert#: [id number]      Employer:  Josem Kaufmann provided by Lattie Haw at [place] for admit to CIR on 03/11/18; Clinical updates are due to Abigail Butts in 5 days (03/15/18). Benefits:  Phone #: [phone]     Name: Ozzie Hoyle. Date: 04/20/17     Deduct: $3,500 (met: $3,500)      Out of Pocket Max: $7,350 (met $5,358.72) includes deductible     Life Max:  CIR: 80%/20%      SNF: 80%/20%; 60 days Outpatient: 80% (60 visits comb PT/OT/ST)     Co-Pay: 20% Home Health: 80%; per necessity      Co-Pay: 20% DME: 80%     Co-Pay: 20% Providers:  SECONDARY:       Policy#:       Subscriber:  CM Name:       Phone#:      Fax#:  Pre-Cert#:       Employer:  Benefits:  Phone #:      Name:  Eff. 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 She was discharged to home with home health services after declining skilled nursing facility ambulating minimal assistance 80 feet with a rolling walker. She lives with her husband who works during the day. Patient presented [name] 03/08/2018 with increasing left lower extremity weakness and blurred vision. Cranial CT scan completed in the ED showing subacute infarction right ACA territory remote right occipital and inferior cerebellar infarct without intracranial hemorrhage. MRI showed slight interval expansion worsening of the right ACA territory infarction as compared to previous studies. CTA concerning for significant right MCA disease with neurology consulted as well as interventional radiology and patient was transferred to [name] for further evaluation.Cerebral angiogram completed 03/09/2018  showing occluded right ACA A1/2 junction. 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 Neurology follow-up currently planned is to continue aspirin and Plavix therapy and follow-up 3 months with interventional radiology for repeat arteriogram. Therapy evaluations completed with recommendations of physical medicine rehabilitation consult. Patient is to be admitted for a comprehensive rehabilitation program on 03/11/18. Complete NIHSS TOTAL: 6    Past Medical History  Past Medical History:  Diagnosis Date  . Anxiety   . Chronic kidney disease   . Diabetes mellitus without complication ([place])   . Fibromyalgia   . Headache   . History of kidney stones   . Hyperlipemia   . Hypertension   . Hypothyroidism   . Insomnia   . Pancreatitis    chronic  . Sleep apnea   . Stroke (cerebrum) [facility])      Family History  family history is not on file.  Prior Rehab/Hospitalizations:  Has the patient had major surgery during 100 days prior to admission? No  Current Medications   Current Facility-Administered Medications:  .  acetaminophen (TYLENOL) tablet 650 mg, 650 mg, Oral, Q6H PRN, 650 mg at 03/09/18 1715 **OR** acetaminophen (TYLENOL) suppository 650 mg, 650 mg, Rectal, Q6H PRN, Reubin Milan, MD .  aspirin EC tablet 81 mg, 81 mg, Oral, Daily, Reubin Milan, MD, 81 mg at 03/11/18 0831 .  atorvastatin (LIPITOR) tablet 80 mg, 80 mg, Oral, QHS, Reubin Milan, MD, 80 mg at 03/10/18 2156 .  bisacodyl (DULCOLAX) suppository 10 mg, 10 mg, Rectal, Daily PRN, Reubin Milan, MD .  DULoxetine (CYMBALTA) DR capsule 60 mg, 60 mg, Oral, Daily, Reubin Milan, MD, 60 mg at 03/11/18 0831 .  insulin aspart (novoLOG) injection 0-20 Units, 0-20 Units, Subcutaneous, TID WC, Reubin Milan, MD, 4 Units at 03/11/18 [phone] .  insulin glargine (LANTUS) injection 30 Units, 30 Units, Subcutaneous, QHS, Reubin Milan, MD, 30 Units at 03/10/18 2311 .  levETIRAcetam (KEPPRA) tablet 500 mg, 500 mg, Oral, BID, Reubin Milan, MD, 500 mg at 03/11/18 0831 .  ondansetron (ZOFRAN) tablet 4 mg, 4 mg, Oral, Q6H PRN **OR** ondansetron (ZOFRAN) injection 4 mg, 4 mg, Intravenous, Q6H PRN, Reubin Milan, MD .  QUEtiapine (SEROQUEL) tablet 50 mg, 50 mg, Oral, QHS, Reubin Milan, MD, 50 mg at 03/10/18 2156 .  senna-docusate (Senokot-S) tablet 1 tablet, 1 tablet, Oral, QHS PRN, Reubin Milan, MD  Patients Current Diet:  Diet Order            Diet Carb Modified Fluid consistency: Thin; Room service appropriate? Yes  Diet effective now              Precautions / Restrictions Precautions Precautions: Fall Precaution Comments: L sided weakness, blurry vision Restrictions Weight Bearing Restrictions: No   Has the patient had 2 or more falls or a fall with  injury in the past year?Yes  Prior Activity Level Community (5-7x/wk): worked full time at [employer]; drove PTA; Independent PTA  Home Assistive Devices / Equipment Home Assistive Devices/Equipment: Gilford Rile (specify type) Home Equipment: Walker - 2 wheels, Grab bars - toilet, Grab bars - tub/shower  Prior Device Use: Indicate devices/aids used by the patient prior to current illness, exacerbation or injury? None of the above  Prior Functional Level Prior Function Level of Independence: Independent Comments: pt reports prior to first CVA independent, after previous admission requires some assist but husband works and was  using RW for mobility during day independently   Self Care: Did the patient need help bathing, dressing, using the toilet or eating?  Independent  Indoor Mobility: Did the patient need assistance with walking from room to room (with or without device)? Independent  Stairs: Did the patient need assistance with internal or external stairs (with or without device)? Independent  Functional Cognition: Did the patient need help planning regular tasks such as shopping or remembering to take medications? Independent  Current Functional Level Cognition  Arousal/Alertness: Awake/alert Overall Cognitive Status: Impaired/Different from baseline Current Attention Level: Sustained Orientation Level: Oriented X4 Following Commands: Follows one step commands with increased time Safety/Judgement: Decreased awareness of safety, Decreased awareness of deficits General Comments: Slow responses and limited verbalizations, especially when focused on single task. Required frequent, multimodal cues for completing tasks with L-side. Very pleasant and willing to participate Attention: Sustained Sustained Attention: Appears intact Memory: Impaired(history of baseline impairments) Memory Impairment: Decreased short term memory Awareness: Appears intact Executive Function: (at baseline per  chart review)    Extremity Assessment (includes Sensation/Coordination)  Upper Extremity Assessment: LUE deficits/detail LUE Deficits / Details: Grossly 3+/5 MMT  LUE Sensation: (to be assessed) LUE Coordination: decreased fine motor, decreased gross motor  Lower Extremity Assessment: LLE deficits/detail LLE Deficits / Details: Slow to initiate resistance, hip flexion, hams 3+, quads >3+ LLE Sensation: decreased light touch LLE Coordination: decreased fine motor    ADLs  Overall ADL's : Needs assistance/impaired Grooming: Supervision/safety, Set up, Sitting Upper Body Bathing: Set up, Sitting Lower Body Bathing: Maximal assistance, Sit to/from stand Upper Body Dressing : Minimal assistance, Sitting Lower Body Dressing: Total assistance, Sit to/from stand Toilet Transfer: Moderate assistance, BSC, RW, Cueing for sequencing, Cueing for safety, Stand-pivot Toileting- Clothing Manipulation and Hygiene: Moderate assistance, +2 for safety/equipment, Sit to/from stand, +2 for physical assistance Toileting - Clothing Manipulation Details (indicate cue type and reason): +2 to manage clothing while therapist mainatins balance Functional mobility during ADLs: Moderate assistance, Rolling walker, Cueing for safety, Cueing for sequencing General ADL Comments: Pt limited by L inattention, L sided weakness, impaired cognition, decreased initation and motor planning.     Mobility  Overal bed mobility: Needs Assistance Bed Mobility: Sit to Supine Supine to sit: Mod assist Sit to supine: Min assist General bed mobility comments: MinA to manage LEs and pad placement    Transfers  Overall transfer level: Needs assistance Equipment used: 1 person hand held assist, 2 person hand held assist Transfers: Sit to/from Stand Sit to Stand: Mod assist, +2 safety/equipment Stand pivot transfers: Mod assist General transfer comment: ModA+2 to assist trunk elevation and maintain balance; pt reaching for RUE  support to keep balance. Decreased initiation and motor planning requiring cues    Ambulation / Gait / Stairs / Wheelchair Mobility  Ambulation/Gait Ambulation/Gait assistance: Mod assist, Max assist, +2 physical assistance, +2 safety/equipment Gait Distance (Feet): 20 Feet Assistive device: 1 person hand held assist, 2 person hand held assist Gait Pattern/deviations: Step-to pattern, Decreased step length - left, Decreased dorsiflexion - left, Decreased weight shift to right, Scissoring, Narrow base of support General Gait Details: Prolonged time spent standing at EOB working on standing and dynamic balance prior to walking. Amb 10' forwards and 10' backwards, requiring mod-maxA+2 and bilateral HHA to maintain balance; pt did better initiating steps with LLE after performing partial squat. Multimodal cues for sequencing throughout. Difficulty taking complete step with LLE, at times requiring maxA to assist step. With fatigue, began to shuffle bilateral feet. L  lateral lean Gait velocity: decreased Gait velocity interpretation: <1.31 ft/sec, indicative of household ambulator    Posture / Balance Balance Overall balance assessment: Needs assistance Sitting-balance support: No upper extremity supported, Feet supported Sitting balance-Leahy Scale: Fair Standing balance support: Bilateral upper extremity supported, During functional activity Standing balance-Leahy Scale: Poor Standing balance comment: Reliant on UE support and external assist for static and dynamic standing    Special needs/care consideration BiPAP/CPAP: yes, CPAP at night per pt report CPM: no Continuous Drip IV: no Dialysis: no        Days: no Life Vest: no Oxygen: no Special Bed: no Trach Size: no Wound Vac (area): no      Location: no Skin: ecchymosis BUEs                            Bowel mgmt:continent, last BM: 03/08/18 Bladder mgmt:pt required In & Out Cath on 03/11/18 as pt was unable to void despite full  bladder Diabetic mgmt: yes     Previous Home Environment Living Arrangements: Children, Spouse/significant other  Lives With: Spouse Available Help at Discharge: Family, Available PRN/intermittently Type of Home: House Home Layout: One level Home Access: Stairs to enter Entrance Stairs-Rails: Left Entrance Stairs-Number of Steps: 4 STE  Bathroom Shower/Tub: Gaffer, Architectural technologist: [place]: Yes Type of Home Care Services: Homehealth aide, Home OT, Home PT  Discharge Living Setting Plans for Discharge Living Setting: Patient's home, Lives with (comment)(husband and two grown children) Type of Home at Discharge: House Discharge Home Layout: One level Discharge Home Access: Stairs to enter Entrance Stairs-Rails: Can reach both Entrance Stairs-Number of Steps: 4 Discharge Bathroom Shower/Tub: Walk-in shower Discharge Bathroom Toilet: Standard Discharge Bathroom Accessibility: Yes How Accessible: Accessible via walker Does the patient have any problems obtaining your medications?: No  Social/Family/Support Systems Patient Roles: Spouse, Parent, Other (Comment)(worked full time. ) Contact Information: husband Mitzi Hansen): [phone] Anticipated Caregiver: husband, two children (grown), and chuch friends Anticipated Ambulance person Information: see above Ability/Limitations of Caregiver: Min A Caregiver Availability: Other (Comment)(close to 24/7 A) Discharge Plan Discussed with Primary Caregiver: Yes Is Caregiver In Agreement with Plan?: Yes Does Caregiver/Family have Issues with Lodging/Transportation while Pt is in Rehab?: No   Goals/Additional Needs Patient/Family Goal for Rehab: PT: Supervision; OT: Supervision/Min A; SLP: Supervision Expected length of stay: 15-23 days Cultural Considerations: NA Dietary Needs: Carb Modified; thin liquids (medium calorie level 1600-2000) Equipment Needs: TBD Pt/Family Agrees to Admission and  willing to participate: Yes Program Orientation Provided & Reviewed with Pt/Caregiver Including Roles  & Responsibilities: Yes(with pt and husband)  Barriers to Discharge: Home environment access/layout, Lack of/limited family support  Barriers to Discharge Comments: pt should have close to 24/7 A. has steps to enter;    Decrease burden of Care through IP rehab admission: NA    Possible need for SNF placement upon discharge: Not anticipated; pt has good social support at DC and has excellent prognosis for further progress.    Patient Condition: This patient's medical and functional status has changed since the consult dated: 03/10/18 in which the Rehabilitation Physician determined and documented that the patient's condition is appropriate for intensive rehabilitative care in an inpatient rehabilitation facility. Medical changes are: no new changes; see HPI for information.  Functional changes are: progression from Mod A supine to sit EOB to Min A EOB, decline in functional transfers from  Mod A transfers to mod A x2, and a decrease  in functional mobility from Mod A 80 feet on evaluation to Mod/Max A x 2 for 20 feet . After evaluating the patient today and speaking with the Rehabilitation physician and acute team, the patient remains appropriate for inpatient rehab. Will admit to inpatient rehab today.  Preadmission Screen Completed By:  Jhonnie Garner, 03/11/2018 10:34 AM ______________________________________________________________________   Discussed status with Dr. Naaman Plummer on 03/11/18 at 11:20AM and received telephone approval for admission today.  Admission Coordinator:  Jhonnie Garner, time 11:20AM/Date 03/11/18.

## 2018-03-11 NOTE — Progress Notes (Signed)
Pt transferred to 4W20. Report and [facility]. Belongings including CPAP, cell phone and charger, clothing transferred with the patient.   Leonidas Rombergaitlin S Bumbledare, RN

## 2018-03-12 ENCOUNTER — Inpatient Hospital Stay (HOSPITAL_COMMUNITY): Payer: 59 | Admitting: Physical Therapy

## 2018-03-12 ENCOUNTER — Inpatient Hospital Stay (HOSPITAL_COMMUNITY): Payer: 59 | Admitting: Occupational Therapy

## 2018-03-12 ENCOUNTER — Inpatient Hospital Stay (HOSPITAL_COMMUNITY): Payer: 59 | Admitting: Speech Pathology

## 2018-03-12 DIAGNOSIS — Z9989 Dependence on other enabling machines and devices: Secondary | ICD-10-CM

## 2018-03-12 DIAGNOSIS — I63321 Cerebral infarction due to thrombosis of right anterior cerebral artery: Secondary | ICD-10-CM

## 2018-03-12 DIAGNOSIS — G4733 Obstructive sleep apnea (adult) (pediatric): Secondary | ICD-10-CM

## 2018-03-12 DIAGNOSIS — E1142 Type 2 diabetes mellitus with diabetic polyneuropathy: Secondary | ICD-10-CM

## 2018-03-12 DIAGNOSIS — I10 Essential (primary) hypertension: Secondary | ICD-10-CM

## 2018-03-12 DIAGNOSIS — N182 Chronic kidney disease, stage 2 (mild): Secondary | ICD-10-CM

## 2018-03-12 DIAGNOSIS — Z794 Long term (current) use of insulin: Secondary | ICD-10-CM

## 2018-03-12 LAB — GLUCOSE, CAPILLARY
GLUCOSE-CAPILLARY: 125 mg/dL — AB (ref 70–99)
GLUCOSE-CAPILLARY: 176 mg/dL — AB (ref 70–99)
GLUCOSE-CAPILLARY: 160 mg/dL — AB (ref 70–99)
Glucose-Capillary: 179 mg/dL — ABNORMAL HIGH (ref 70–99)

## 2018-03-12 NOTE — Evaluation (Signed)
Occupational Therapy Assessment and Plan  Patient Details  Name: [name] MRN: [id number] Date of Birth: 10-Sep-1965  OT Diagnosis: abnormal posture, cognitive deficits, hemiplegia affecting non-dominant side and coordination disorder Rehab Potential: Rehab Potential (ACUTE ONLY): Good ELOS: 10-14 days   Today's Date: 03/12/2018 OT Individual Time: 1300-1415 OT Individual Time Calculation (min): 75 min     Problem List:  Patient Active Problem List   Diagnosis Date Noted  . CKD (chronic kidney disease) stage 2, GFR 60-89 ml/min 03/12/2018  . OSA on CPAP 03/12/2018  . Thrombotic cerebral infarction ([place]) 03/11/2018  . Seizures ([place]) 03/08/2018  . Non compliance w medication regimen   . Dyslipidemia 03/03/2018  . Acute CVA (cerebrovascular accident) ([place]) 03/02/2018  . Type 2 diabetes mellitus ([place]) 03/25/2015  . Hypothyroidism 03/25/2015  . HTN (hypertension) 03/25/2015  . RLS (restless legs syndrome) 03/25/2015  . History of stroke 11/30/2014  . Obesity, morbid, BMI 40.0-49.9 ([place]) 11/30/2014  . Pure hypertriglyceridemia 11/30/2014    Past Medical History:  Past Medical History:  Diagnosis Date  . Anxiety   . Chronic kidney disease   . Diabetes mellitus without complication ([place])   . Fibromyalgia   . Headache   . History of kidney stones   . Hyperlipemia   . Hypertension   . Hypothyroidism   . Insomnia   . Pancreatitis    chronic  . Sleep apnea   . Stroke (cerebrum) ([place])   . Thrombotic cerebral infarction [facility]) 03/11/2018   Past Surgical History:  Past Surgical History:  Procedure Laterality Date  . ABDOMINAL HYSTERECTOMY    . CHOLECYSTECTOMY    . ROTATOR CUFF REPAIR Right   . SHOULDER SURGERY Left     Assessment & Plan Clinical Impression: Patient is a 52 y.o. year old female with history of hypertension, OSA with CPAP, diabetes mellitus, hyperlipidemia, CKD stage II, fibromyalgia, previous CVA 2014 with residual left-sided weakness and noncompliant  with medications as well as recent admission 03/02/2018 to 03/04/2018 for increasing left side weakness as well as reported new onset seizure and was started on Keppra.Marland Kitchen MRI at that time showed right posterior ACA territory infarctions and maintain on aspirin and Plavix.EEG was negative for seizure. She was discharged to home with home health services after declining skilled nursing facility ambulating minimal assistance 80 feet with a rolling walker. She lives with her husband who works during the day. Patient presented [facility] 03/08/2018 with increasing left lower extremity weakness and blurred vision. Cranial CT scan completed in the ED showing subacute infarction right ACA territory remote right occipital and inferior cerebellar infarct without intracranial hemorrhage. MRI showed slight interval expansion worsening of the right ACA territory infarction as compared to previous studies. 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 Neurology follow-up currently planned is to continue aspirin and Plavix therapy and follow-up 3 months with interventional radiology for repeat arteriogram.  Patient transferred to [place] on 03/11/2018 .    Patient currently requires mod with basic self-care skills secondary to muscle weakness, decreased cardiorespiratoy endurance, decreased initiation, decreased attention, decreased awareness, decreased problem solving, decreased safety awareness, decreased memory and delayed processing and decreased sitting balance, decreased standing balance, hemiplegia and decreased balance strategies.  Prior to hospitalization, patient could  complete ADLs and IADLs  with supervision.  Patient will benefit from skilled intervention to decrease level of assist with basic self-care skills prior to discharge home with care partner.  Anticipate patient will require 24 hour supervision and follow up outpatient.  OT - End of Session Activity Tolerance: Decreased this session Endurance Deficit: Yes Endurance Deficit Description: decreased OT Assessment Rehab Potential (ACUTE ONLY): Good OT Barriers to Discharge: Decreased caregiver support OT Patient demonstrates impairments in the following area(s): Balance;Cognition;Endurance;Motor;Pain;Safety OT Basic ADL's Functional Problem(s): Grooming;Bathing;Dressing;Toileting OT Advanced ADL's Functional Problem(s): Simple Meal Preparation;Laundry OT Transfers Functional Problem(s): Toilet;Tub/Shower OT Additional Impairment(s): Fuctional Use of Upper Extremity OT Plan OT Intensity: Minimum of 1-2 x/day, 45 to 90 minutes OT Frequency: 5 out of 7 days OT Duration/Estimated Length of Stay: 10-14 days OT Treatment/Interventions: Medical illustrator training;Patient/family education;Therapeutic Activities;DME/adaptive equipment instruction;Cognitive remediation/compensation;Functional electrical stimulation;Psychosocial support;Therapeutic Exercise;Functional mobility training;Self Care/advanced ADL retraining;UE/LE Strength taining/ROM;Discharge planning;Neuromuscular re-education;UE/LE Coordination activities OT Self Feeding Anticipated Outcome(s): n/a OT Basic Self-Care Anticipated Outcome(s): supervision OT Toileting Anticipated Outcome(s): supervision OT Bathroom Transfers Anticipated Outcome(s): supervision OT Recommendation Recommendations for Other Services: Therapeutic Recreation consult;Neuropsych consult Therapeutic Recreation Interventions: Kitchen group;Outing/community reintergration Patient destination: Home Follow Up Recommendations: 24 hour supervision/assistance;Outpatient OT Equipment Recommended: To  be determined   Skilled Therapeutic Intervention Upon entering the room, pt seated in wheelchair with no c/o pain and requesting to shower. IV site covered on L UE for safety. Pt ambulating with hand held mod A into bathroom with mod verbal and tactile cuing for sequencing and initiation of movement. Pt seated on toilet to doff clothing items safely. Pt transferred onto TTB for bathing at shower level with mod A overall with use of lateral leans to wash buttocks and peri area. Pt returned to sit on commode to don clothing items with mod A for standing balance with LB clothing management. Pt needing cuing to utilize L UE in functional tasks. Pt returning to sit in wheelchair at end of session with min A and use of RW. Chair alarm donned and call bell within reach.  OT educated pt on OT purpose, POC, and goals with pt verbalizing understanding and agreement.   OT Evaluation Precautions/Restrictions  Precautions Precautions: Fall Restrictions Weight Bearing Restrictions: No Vital Signs Therapy Vitals Temp: (!) 97.5 F (36.4 C) Pulse Rate: 99 Resp: 18 BP: (!) 167/94 Patient Position (if appropriate): Sitting Oxygen Therapy SpO2: 97 % O2 Device: Room Air Pain Pain Assessment Pain Scale: 0-10 Pain Score: 0-No pain Home Living/Prior Functioning Home Living Family/patient expects to be discharged to:: Private residence Living Arrangements: Spouse/significant other Available Help at Discharge: Family, Available PRN/intermittently Type of Home: House Home Access: Stairs to enter Technical brewer of Steps: 4 steps Entrance Stairs-Rails: Left Home Layout: One level Bathroom Shower/Tub: Gaffer, Architectural technologist: Standard  Lives With: Spouse Prior Function Level of Independence: Independent with basic ADLs, Independent with transfers, Independent with homemaking with ambulation, Independent with gait  Able to Take Stairs?: Yes Driving: Yes Vocation:  Unemployed Vocation Requirements: 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, went home and was getting around independently w/ RW while husband was at work during the day, reports having a few falls in that time period Comments: pt reports prior to first CVA independent, after previous admission requires some assist but husband works and was using RW for mobility during day independently  Vision Baseline Vision/History: Wears glasses Wears Glasses: At all times Patient Visual Report: Blurring of vision Vision Assessment?: Yes  Eye Alignment: Within Functional Limits Ocular Range of Motion: Restricted on the left Alignment/Gaze Preference: Within Defined Limits Tracking/Visual Pursuits: Decreased smoothness of horizontal tracking;Requires cues, head turns, or add eye shifts to track;Decreased smoothness of vertical tracking;Impaired - to be further tested in functional context;Decreased smoothness of eye movement to LEFT superior field;Decreased smoothness of eye movement to LEFT inferior field Saccades: Additional head turns occurred during testing;Decreased speed of saccadic movement Visual Fields: No apparent deficits Perception  Inattention/Neglect: Does not attend to left side of body Cognition Overall Cognitive Status: Impaired/Different from baseline Arousal/Alertness: Awake/alert Orientation Level: Person;Place;Situation Person: Oriented Place: Oriented Situation: Oriented Year: 2019 Month: November Day of Week: Correct Memory: Impaired Memory Impairment: Decreased recall of new information Attention: Sustained Sustained Attention: Appears intact Awareness: Impaired Awareness Impairment: Emergent impairment Behaviors: Impulsive Safety/Judgment: Impaired Comments: mild impulsivity, L inattention, and decreased safety awareness Sensation Sensation Light Touch: Impaired by gross assessment Coordination Gross Motor Movements are Fluid  and Coordinated: No Fine Motor Movements are Fluid and Coordinated: No Finger Nose Finger Test: impaired LUE Motor  Motor Motor: Hemiplegia Motor - Skilled Clinical Observations: Mild L hemi  Mobility  Bed Mobility Bed Mobility: Rolling Right;Rolling Left;Supine to Sit;Sit to Supine Rolling Right: Supervision/verbal cueing Rolling Left: Supervision/Verbal cueing Supine to Sit: Supervision/Verbal cueing Sit to Supine: Supervision/Verbal cueing Transfers Sit to Stand: Minimal Assistance - Patient > 75% Stand to Sit: Minimal Assistance - Patient > 75%  Trunk/Postural Assessment  Cervical Assessment Cervical Assessment: Within Functional Limits Thoracic Assessment Thoracic Assessment: Exceptions to WFL(rounded shoulders) Lumbar Assessment Lumbar Assessment: Exceptions to WFL(posterior pelvic tilt) Postural Control Postural Control: Deficits on evaluation  Balance Balance Balance Assessed: Yes Static Sitting Balance Static Sitting - Balance Support: No upper extremity supported;Feet supported Static Sitting - Level of Assistance: 5: Stand by assistance Dynamic Sitting Balance Dynamic Sitting - Balance Support: No upper extremity supported;Feet supported Dynamic Sitting - Level of Assistance: 5: Stand by assistance Static Standing Balance Static Standing - Balance Support: No upper extremity supported;During functional activity Static Standing - Level of Assistance: 4: Min assist Dynamic Standing Balance Dynamic Standing - Balance Support: During functional activity;No upper extremity supported Dynamic Standing - Level of Assistance: 3: Mod assist Extremity/Trunk Assessment RUE Assessment RUE Assessment: Within Functional Limits LUE Assessment LUE Assessment: Exceptions to [facility] General Strength Comments: 3-/5 throughout      Refer to Care Plan for Long Term Goals  Recommendations for other services: Therapeutic Recreation  Kitchen group and Outing/community  reintegration   Discharge Criteria: Patient will be discharged from OT if patient refuses treatment 3 consecutive times without medical reason, if treatment goals not met, if there is a change in medical status, if patient makes no progress towards goals or if patient is discharged from hospital.  The above assessment, treatment plan, treatment alternatives and goals were discussed and mutually agreed upon: by patient  Gypsy Decant 03/12/2018, 4:51 PM

## 2018-03-12 NOTE — Progress Notes (Signed)
Physical Therapy Session Note  Patient Details  Name: [name] MRN: [id number] Date of Birth: Jul 19, 1965  Today's Date: 03/12/2018 PT Individual Time: [id number] PT Individual Time Calculation (min): 40 min    Skilled Therapeutic Interventions/Progress Updates: Pt received seated in w/c, denies pain and agreeable to treatment. Transported to/from gym totalA in w/c for energy conservation. Assessed TUG and Berg with results as below indicating high risk for falls; educated pt regarding results and goals to continue to work on balance and gait. Gait trial x90' with RW and minA; cues for attention to task and demonstrates inconsistent/irregular gait pattern with variable step length, poor RW management. Remained in w/c at end of session, lap belt alarm intact and all needs in reach.    Therapy Documentation Precautions:  Precautions Precautions: Fall Restrictions Weight Bearing Restrictions: No Balance: Balance Balance Assessed: Yes Standardized Balance Assessment Standardized Balance Assessment: Timed Up and Go Test;Berg Balance Test Berg Balance Test Sit to Stand: Able to stand  independently using hands Standing Unsupported: Able to stand 2 minutes with supervision Sitting with Back Unsupported but Feet Supported on Floor or Stool: Able to sit safely and securely 2 minutes Stand to Sit: Uses backs of legs against chair to control descent Transfers: Needs one person to assist Standing Unsupported with Eyes Closed: Able to stand 10 seconds with supervision Standing Ubsupported with Feet Together: Needs help to attain position and unable to hold for 15 seconds From Standing, Reach Forward with Outstretched Arm: Reaches forward but needs supervision From Standing Position, Pick up Object from Floor: Unable to try/needs assist to keep balance From Standing Position, Turn to Look Behind Over each Shoulder: Looks behind one side only/other side shows less weight shift Turn 360 Degrees:  Needs assistance while turning Standing Unsupported, Alternately Place Feet on Step/Stool: Needs assistance to keep from falling or unable to try Standing Unsupported, One Foot in Front: Loses balance while stepping or standing Standing on One Leg: Unable to try or needs assist to prevent fall Total Score: 20 Timed Up and Go Test TUG: Normal TUG Normal TUG (seconds): 94(RW minA/min guard) Static Sitting Balance Static Sitting - Balance Support: No upper extremity supported;Feet supported Static Sitting - Level of Assistance: 5: Stand by assistance Dynamic Sitting Balance Dynamic Sitting - Balance Support: No upper extremity supported;Feet supported Dynamic Sitting - Level of Assistance: 5: Stand by assistance Static Standing Balance Static Standing - Balance Support: No upper extremity supported;During functional activity Static Standing - Level of Assistance: 4: Min assist Dynamic Standing Balance Dynamic Standing - Balance Support: During functional activity;No upper extremity supported Dynamic Standing - Level of Assistance: 3: Mod assist(mod assist to correct L lateral lean w/ dynamic balance)    Therapy/Group: Individual Therapy  [name] 03/12/2018, 11:57 AM

## 2018-03-12 NOTE — Evaluation (Signed)
Physical Therapy Assessment and Plan  Patient Details  Name: [name] MRN: [id number] Date of Birth: 08-07-1965  PT Diagnosis: Abnormal posture, Abnormality of gait, Cognitive deficits, Coordination disorder, Difficulty walking, Hemiplegia non-dominant, Impaired cognition, Impaired sensation and Muscle weakness Rehab Potential: Good ELOS: 10-14 days   Today's Date: 03/12/2018 PT Individual Time: 0800-0900 PT Individual Time Calculation (min): 60 min    Problem List:  Patient Active Problem List   Diagnosis Date Noted  . Thrombotic cerebral infarction ([place]) 03/11/2018  . Seizures ([place]) 03/08/2018  . Non compliance w medication regimen   . Dyslipidemia 03/03/2018  . Acute CVA (cerebrovascular accident) ([place]) 03/02/2018  . Type 2 diabetes mellitus ([place]) 03/25/2015  . Hypothyroidism 03/25/2015  . HTN (hypertension) 03/25/2015  . RLS (restless legs syndrome) 03/25/2015  . History of stroke 11/30/2014  . Obesity, morbid, BMI 40.0-49.9 ([place]) 11/30/2014  . Pure hypertriglyceridemia 11/30/2014    Past Medical History:  Past Medical History:  Diagnosis Date  . Anxiety   . Chronic kidney disease   . Diabetes mellitus without complication ([place])   . Fibromyalgia   . Headache   . History of kidney stones   . Hyperlipemia   . Hypertension   . Hypothyroidism   . Insomnia   . Pancreatitis    chronic  . Sleep apnea   . Stroke (cerebrum) ([place])   . Thrombotic cerebral infarction [facility]) 03/11/2018   Past Surgical History:  Past Surgical History:  Procedure Laterality Date  . ABDOMINAL HYSTERECTOMY    . CHOLECYSTECTOMY    . ROTATOR CUFF REPAIR Right   . SHOULDER SURGERY Left     Assessment & Plan Clinical Impression: Patient is a 52 year old right-handed female with history of hypertension, OSA with CPAP, diabetes mellitus, hyperlipidemia, CKD stage II, fibromyalgia, previous CVA 2014 with residual left-sided weakness and noncompliant with medications as well as recent  admission 03/02/2018 to 03/04/2018 for increasing left side weakness as well as reported new onset seizure and was started on Keppra.[facility] Kitchen MRI at that time showed right posterior ACA territory infarctions and maintain on aspirin and Plavix.EEG was negative for seizure. She was discharged to home with home health services after declining skilled nursing facility ambulating minimal assistance 80 feet with a rolling walker. She lives with her husband who works during the day. Patient presented [facility] 03/08/2018 with increasing left lower extremity weakness and blurred vision. Cranial CT scan completed in the ED showing subacute infarction right ACA territory remote right occipital and inferior cerebellar infarct without intracranial hemorrhage. MRI showed slight interval expansion worsening of the right ACA territory infarction as compared to previous studies. CTA concerning for significant right MCA disease with neurology consulted as well as interventional radiology and patient was transferred to [facility] for further evaluation.Cerebral angiogram completed 03/09/2018 showing occluded right ACA A1/2 junction. 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 Neurology follow-up currently planned is to continue aspirin and Plavix therapy and follow-up 3 months with interventional radiology for repeat arteriogram. Therapy evaluations completed with recommendations of physical medicine rehabilitation consult. Patient transferred to [facility] on 03/11/2018 .   Patient currently requires mod with mobility secondary to muscle weakness, decreased cardiorespiratoy endurance, impaired timing and sequencing, unbalanced muscle activation, decreased coordination and decreased motor planning, decreased attention to left, decreased initiation, decreased attention, decreased awareness, decreased problem solving, decreased safety awareness, decreased memory and  delayed processing and decreased sitting balance, decreased standing balance, hemiplegia and decreased  balance strategies.  Prior to hospitalization, patient was modified independent  with mobility and lived with Spouse in a House home.  Home access is 4 stepsStairs to enter.  Patient will benefit from skilled PT intervention to maximize safe functional mobility, minimize fall risk and decrease caregiver burden for planned discharge home with 24 hour supervision.  Anticipate patient will benefit from follow up [place] at discharge.  PT - End of Session Activity Tolerance: Tolerates 10 - 20 min activity with multiple rests Endurance Deficit: Yes Endurance Deficit Description: decreased PT Assessment Rehab Potential (ACUTE/IP ONLY): Good PT Barriers to Discharge: Decreased caregiver support PT Barriers to Discharge Comments: husband works during the day  PT Patient demonstrates impairments in the following area(s): Balance;Behavior;Endurance;Motor;Perception;Safety;Sensory PT Transfers Functional Problem(s): Bed Mobility;Bed to Chair;Car;Furniture;Floor PT Locomotion Functional Problem(s): Stairs;Wheelchair Mobility;Ambulation PT Plan PT Intensity: Minimum of 1-2 x/day ,45 to 90 minutes PT Frequency: 5 out of 7 days PT Duration Estimated Length of Stay: 10-14 days PT Treatment/Interventions: Ambulation/gait training;Discharge planning;Functional mobility training;Psychosocial support;Therapeutic Activities;Visual/perceptual remediation/compensation;Wheelchair propulsion/positioning;Therapeutic Exercise;Skin care/wound management;Neuromuscular re-education;Balance/vestibular training;Disease management/prevention;Cognitive remediation/compensation;DME/adaptive equipment instruction;Splinting/orthotics;Pain management;UE/LE Strength taining/ROM;UE/LE Coordination activities;Stair training;Patient/family education;Functional electrical stimulation;Community reintegration PT Transfers Anticipated  Outcome(s): supervision  PT Locomotion Anticipated Outcome(s): supervision w/ LRAD, household ambulation  PT Recommendation Recommendations for Other Services: Neuropsych consult Follow Up Recommendations: Home health PT Patient destination: Home Equipment Recommended: To be determined Equipment Details: has RW from previous admission  Skilled Therapeutic Intervention  Pt in received in w/c and in care of NT, agreeable to therapy and denies pain. Requesting to don clothing prior to leaving room. Provided min assist to don upper and lower body clothing, mod assist to maintain balance w/o UE support on RW when bringing pants over hips. Performed functional mobility as detailed below, additionally performed car transfer w/ mod assist. Frequent verbal cues needed for attention, technique, sequencing, and safety. Instructed pt in results of PT evaluation as detailed below, PT POC, rehab potential, rehab goals, and discharge recommendations. Pt aware that the current recommendation is 24/7 supervision and will talk to her husband about the possibility of arranging this. Additionally discussed CIR's policies regarding fall safety and use of chair alarm and/or quick release belt. Pt verbalized understanding and in agreement. Ended session in w/c, all needs in reach.  PT Evaluation Precautions/Restrictions Precautions Precautions: Fall Restrictions Weight Bearing Restrictions: No Pain Pain Assessment Pain Scale: 0-10 Pain Score: 0-No pain Home Living/Prior Functioning Home Living Available Help at Discharge: Family;Available PRN/intermittently Type of Home: House Home Access: Stairs to enter [employer] of Steps: 4 steps Entrance Stairs-Rails: Left Home Layout: One level Bathroom Shower/Tub: Walk-in Sales promotion account executive: Standard  Lives With: Spouse Prior Function Level of Independence: Independent with basic ADLs;Independent with transfers;Independent with homemaking  with ambulation;Independent with gait(prior to hospital admission 11/13-11/15)  Able to Take Stairs?: Yes Driving: Yes Vocation Requirements: 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, went home and was getting around independently w/ RW while husband was at work during the day, reports having a few falls in that time period Vision/Perception  Vision - Assessment Eye Alignment: Within Functional Limits Ocular Range of Motion: Restricted on the left Alignment/Gaze Preference: Within Defined Limits Tracking/Visual Pursuits: Decreased smoothness of horizontal tracking;Requires cues, head turns, or add eye shifts to track;Decreased smoothness of vertical tracking;Impaired - to be further tested in functional context;Decreased smoothness of eye movement to LEFT superior field;Decreased smoothness of eye movement to LEFT inferior field Saccades: Additional head turns occurred during testing;Decreased  speed of saccadic movement Perception Perception: Impaired Inattention/Neglect: Does not attend to left side of body  Cognition Overall Cognitive Status: Impaired/Different from baseline Arousal/Alertness: Awake/alert Orientation Level: Oriented X4 Memory: Impaired Memory Impairment: Decreased recall of new information Awareness: Impaired Behaviors: Impulsive Safety/Judgment: Impaired Comments: mild impulsivity, L inattention, and decreased safety awareness Sensation Sensation Light Touch: Impaired by gross assessment(Diminished L side, was diminished at baseline, pt reports it is worse) Coordination Gross Motor Movements are Fluid and Coordinated: No Fine Motor Movements are Fluid and Coordinated: No Finger Nose Finger Test: impaired LUE Heel Shin Test: impaired, suspect 2/2 weakness  Motor  Motor Motor: Hemiplegia Motor - Skilled Clinical Observations: Mild L hemi   Mobility Bed Mobility Bed Mobility: Rolling Right;Rolling Left;Supine to Sit;Sit to  Supine Rolling Right: Supervision/verbal cueing Rolling Left: Supervision/Verbal cueing Supine to Sit: Supervision/Verbal cueing Sit to Supine: Supervision/Verbal cueing Transfers Transfers: Sit to Stand;Stand to Sit;Stand Pivot Transfers Sit to Stand: Minimal Assistance - Patient > 75% Stand to Sit: Minimal Assistance - Patient > 75% Stand Pivot Transfers: Moderate Assistance - Patient 50 - 74% Stand Pivot Transfer Details: Verbal cues for precautions/safety;Manual facilitation for weight shifting;Tactile cues for initiation;Tactile cues for sequencing;Tactile cues for posture;Manual facilitation for placement;Verbal cues for safe use of DME/AE Stand Pivot Transfer Details (indicate cue type and reason): Needed step-by-step cues for safe transfer, required mod assist to correct LOB 2/2 LLE not moving w/ body  Transfer (Assistive device): Rolling walker Locomotion  Gait Ambulation: Yes Gait Assistance: Minimal Assistance - Patient > 75% Gait Distance (Feet): 50 Feet Assistive device: Rolling walker Gait Assistance Details: Verbal cues for safe use of DME/AE;Manual facilitation for weight shifting;Verbal cues for precautions/safety;Manual facilitation for placement;Verbal cues for gait pattern;Verbal cues for sequencing;Verbal cues for technique;Tactile cues for posture Gait Gait: Yes Gait Pattern: Impaired Gait Pattern: Lateral trunk lean to left;Narrow base of support;Poor foot clearance - left Gait velocity: decreased Stairs / Additional Locomotion Stairs: Yes Stairs Assistance: Minimal Assistance - Patient > 75% Stair Management Technique: Two rails Number of Stairs: 4 Height of Stairs: 6 Wheelchair Mobility Wheelchair Mobility: Yes Wheelchair Assistance: Chartered loss adjuster: Both upper extremities Wheelchair Parts Management: Needs assistance Distance: 50'  Trunk/Postural Assessment  Cervical Assessment Cervical Assessment: Within Functional  Limits Thoracic Assessment Thoracic Assessment: Exceptions to WFL(rounded shoulders) Lumbar Assessment Lumbar Assessment: Exceptions to WFL(posterior pelvic tilt) Postural Control Postural Control: Deficits on evaluation(delayed/insufficient in standing)  Balance Balance Balance Assessed: Yes Standardized Balance Assessment Standardized Balance Assessment: Timed Up and Go Test;Berg Balance Test Berg Balance Test Sit to Stand: Able to stand  independently using hands Standing Unsupported: Able to stand 2 minutes with supervision Sitting with Back Unsupported but Feet Supported on Floor or Stool: Able to sit safely and securely 2 minutes Stand to Sit: Uses backs of legs against chair to control descent Transfers: Needs one person to assist Standing Unsupported with Eyes Closed: Able to stand 10 seconds with supervision Standing Ubsupported with Feet Together: Needs help to attain position and unable to hold for 15 seconds From Standing, Reach Forward with Outstretched Arm: Reaches forward but needs supervision From Standing Position, Pick up Object from Floor: Unable to try/needs assist to keep balance From Standing Position, Turn to Look Behind Over each Shoulder: Looks behind one side only/other side shows less weight shift Turn 360 Degrees: Needs assistance while turning Standing Unsupported, Alternately Place Feet on Step/Stool: Needs assistance to keep from falling or unable to try Standing Unsupported, One Foot in Front: Loses balance  while stepping or standing Standing on One Leg: Unable to try or needs assist to prevent fall Total Score: 20 Timed Up and Go Test TUG: Normal TUG Normal TUG (seconds): 94(RW minA/min guard) Static Sitting Balance Static Sitting - Balance Support: No upper extremity supported;Feet supported Static Sitting - Level of Assistance: 5: Stand by assistance Dynamic Sitting Balance Dynamic Sitting - Balance Support: No upper extremity supported;Feet  supported Dynamic Sitting - Level of Assistance: 5: Stand by assistance Static Standing Balance Static Standing - Balance Support: No upper extremity supported;During functional activity Static Standing - Level of Assistance: 4: Min assist Dynamic Standing Balance Dynamic Standing - Balance Support: During functional activity;No upper extremity supported Dynamic Standing - Level of Assistance: 3: Mod assist(mod assist to correct L lateral lean w/ dynamic balance) Extremity Assessment  RLE Assessment RLE Assessment: Within Functional Limits LLE Assessment LLE Assessment: Exceptions to [facility] Passive Range of Motion (PROM) Comments: WNL General Strength Comments: globally 4/5     Refer to Care Plan for Long Term Goals  Recommendations for other services: Neuropsych  Discharge Criteria: Patient will be discharged from PT if patient refuses treatment 3 consecutive times without medical reason, if treatment goals not met, if there is a change in medical status, if patient makes no progress towards goals or if patient is discharged from hospital.  The above assessment, treatment plan, treatment alternatives and goals were discussed and mutually agreed upon: by patient and No family available.  Montanna Mcbain Clent Demark 03/12/2018, 12:05 PM

## 2018-03-12 NOTE — Progress Notes (Signed)
[name] is a 52 y.o. female 1965/08/22 [id number]  Subjective: Patient in bedside wheelchair, no family present at time of my visit.  She denies complaints and has no questions.  Objective: Vital signs in last 24 hours: Temp:  [98 F (36.7 C)-98.4 F (36.9 C)] 98 F (36.7 C) (11/23 0500) Pulse Rate:  [83-100] 83 (11/23 0500) Resp:  [18] 18 (11/23 0500) BP: (157-160)/(90-99) 160/90 (11/23 0500) SpO2:  [93 %-100 %] 96 % (11/23 0500) Weight:  [102.4 kg] 102.4 kg (11/22 1548) Weight change:  Last BM Date: 03/11/18  Intake/Output from previous day: 11/22 0701 - 11/23 0700 In: 222 [P.O.:222] Out: 1500 [Urine:1500]  Physical Exam General: No apparent distress    Lungs: Normal effort. Lungs clear to auscultation, no crackles or wheezes. Cardiovascular: Regular rate and rhythm, no edema Neurological: No new neurological deficits   Lab Results: BMET    Component Value Date/Time   NA 137 03/10/2018 0442   K 4.1 03/10/2018 0442   CL 108 03/10/2018 0442   CO2 19 (L) 03/10/2018 0442   GLUCOSE 155 (H) 03/10/2018 0442   BUN 20 03/10/2018 0442   CREATININE 1.67 (H) 03/10/2018 0442   CALCIUM 8.7 (L) 03/10/2018 0442   GFRNONAA 35 (L) 03/10/2018 0442   GFRAA 40 (L) 03/10/2018 0442   CBC    Component Value Date/Time   WBC 8.9 03/10/2018 0442   RBC 4.84 03/10/2018 0442   HGB 13.8 03/10/2018 0442   HCT 41.9 03/10/2018 0442   PLT 262 03/10/2018 0442   MCV 86.6 03/10/2018 0442   MCH 28.5 03/10/2018 0442   MCHC 32.9 03/10/2018 0442   RDW 12.8 03/10/2018 0442   LYMPHSABS 2.4 03/09/2018 0448   MONOABS 0.6 03/09/2018 0448   EOSABS 0.4 03/09/2018 0448   BASOSABS 0.1 03/09/2018 0448   CBG's (last 3):   Recent Labs    03/11/18 2101 03/12/18 0614 03/12/18 1204  GLUCAP 196* 125* 179*   LFT's Lab Results  Component Value Date   ALT 63 (H) 03/09/2018   AST 59 (H) 03/09/2018   ALKPHOS QUANTITY NOT SUFFICIENT, UNABLE TO PERFORM TEST 03/09/2018   BILITOT 0.4 03/09/2018     Studies/Results: [facility]  Result Date: 03/10/2018 CLINICAL DATA:  Chronic kidney disease. History of kidney stones. Stroke, hypertension, diabetes. EXAM: RENAL / URINARY TRACT ULTRASOUND COMPLETE COMPARISON:  CT abdomen and pelvis 01/17/2014 FINDINGS: Right Kidney: Renal measurements: 11.2 x 5.5 x 5.5 cm = volume: 182 mL mL. Diffusely increased parenchymal echotexture consistent with chronic medical renal disease. Simple appearing cyst in the upper pole measuring 4.2 cm diameter. Cyst in the midpole measuring 3.1 cm diameter. Tiny cyst in the lower pole measuring 1.5 cm diameter. Cysts were present on previous CT. No hydronephrosis. Left Kidney: Renal measurements: 11.4 x 6.8 x 4.4 cm = volume: 182 mL mL. Diffusely increased parenchymal echotexture consistent with chronic medical renal disease. Small cyst in the midpole measuring 1.5 cm diameter. No hydronephrosis. Bladder: No wall thickening or filling defects identified. IMPRESSION: Bilateral increased renal parenchymal echotexture suggesting chronic medical renal disease. Small bilateral renal cysts, unchanged since previous CT. No hydronephrosis. Electronically Signed   By: Burman NievesWilliam  Stevens M.D.   On: 03/10/2018 22:14    Medications:  I have reviewed the patient's current medications. Scheduled Medications: . aspirin EC  81 mg Oral Daily  . atorvastatin  80 mg Oral QHS  . clopidogrel  75 mg Oral Daily  . DULoxetine  60 mg Oral Daily  . insulin aspart  0-20 Units Subcutaneous TID WC  . insulin glargine  30 Units Subcutaneous QHS  . levETIRAcetam  500 mg Oral BID  . QUEtiapine  50 mg Oral QHS   PRN Medications: acetaminophen **OR** acetaminophen, bisacodyl, ondansetron **OR** ondansetron (ZOFRAN) IV, senna-docusate  Assessment/Plan: Active Problems:   Type 2 diabetes mellitus (HCC)   Hypothyroidism   HTN (hypertension)   Dyslipidemia   Thrombotic cerebral infarction (HCC)   CKD (chronic kidney disease) stage 2, GFR 60-89  ml/min   OSA on CPAP   1.  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  Outpatient interventional radiology follow-up for cerebral arteriogram to address chronically occluded right ACA and severe stenosis of right MCA.  Currently admitted to inpatient intensive rehab for focused PT, OT and speech therapy with medical management as ongoing. 2.  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  Controlled at present.  Continue Keppra as prescribed. 3.  Type 2 diabetes with peripheral neuropathy.  Uncontrolled with hemoglobin A1c 12.9 prior to admission.  Continue Lantus with sliding scale as ordered.  Diabetic education with nutritional counseling ongoing. 4.  Chronic kidney disease stage II.  Creatinine stable 5.  Hypertension, essential.  Currently with permissive hypertension following acute stroke.  Continue medications with titration as needed. 6.  Dyslipidemia.  Continue statin as prescribed. 7.  Hypothyroidism.  Continue replacement as ordered. 8.  Obstructive sleep apnea on CPAP prior to admission  Length of stay, days: 1   A. Felicity Coyer, MD 03/12/2018, 1:08 PM

## 2018-03-12 NOTE — Plan of Care (Signed)
  Problem: RH BLADDER ELIMINATION Goal: RH STG MANAGE BLADDER WITH ASSISTANCE Description STG Manage Bladder With min.Assistance  Outcome: Not Progressing; bladder scan q 8 hours

## 2018-03-12 NOTE — Progress Notes (Signed)
Speech Language Pathology Daily Session Note  Patient Details  Name: [name] MRN: [id number] Date of Birth: Mar 25, 1966  Today's Date: 03/12/2018 SLP Individual Time: 0930-1000 SLP Individual Time Calculation (min): 30 min  Short Term Goals: Week 1: SLP Short Term Goal 1 (Week 1): STG=LTG due to ELOS for ST  Skilled Therapeutic Interventions:  Pt was seen for skilled ST targeting cognitive goals.  Upon arrival, pt was pushing her wheelchair in the hall outside of her room.  Pt was able to recall general activities from previous therapy session with supervision question cues but does endorse feeling that her memory is different from baseline.  SLP provided skilled education regarding compensatory strategies for memory.  A handout was provided to maximize carryover in between therapy sessions.  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  Pt was returned to room and left in wheelchair with chair alarm set and call bell left within reach.  Continue per current plan of care.    Pain Pain Assessment Pain Scale: 0-10 Pain Score: 0-No pain  Therapy/Group: Individual Therapy  [name], [name] 03/12/2018, 12:50 PM

## 2018-03-13 ENCOUNTER — Inpatient Hospital Stay (HOSPITAL_COMMUNITY): Payer: 59

## 2018-03-13 ENCOUNTER — Inpatient Hospital Stay (HOSPITAL_COMMUNITY): Payer: 59 | Admitting: Occupational Therapy

## 2018-03-13 DIAGNOSIS — N182 Chronic kidney disease, stage 2 (mild): Secondary | ICD-10-CM

## 2018-03-13 LAB — GLUCOSE, CAPILLARY
GLUCOSE-CAPILLARY: 223 mg/dL — AB (ref 70–99)
GLUCOSE-CAPILLARY: 184 mg/dL — AB (ref 70–99)
GLUCOSE-CAPILLARY: 195 mg/dL — AB (ref 70–99)
GLUCOSE-CAPILLARY: 251 mg/dL — AB (ref 70–99)

## 2018-03-13 NOTE — Progress Notes (Signed)
Occupational Therapy Session Note  Patient Details  Name: [name] MRN: [id number] Date of Birth: 01/18/1966  Today's Date: 03/13/2018 OT Individual Time: 1300-1400 OT Individual Time Calculation (min): 60 min    Short Term Goals: Week 1:  OT Short Term Goal 1 (Week 1): Pt will perform shower transfer with min A. OT Short Term Goal 2 (Week 1): Pt will perform clothing management in standing with min A for standing balance.  OT Short Term Goal 3 (Week 1): Pt will perform toilet transfer with min A.  Skilled Therapeutic Interventions/Progress Updates:    Pt resting in w/c upon arrival with friend present.  Pt agreeable to participating in therapy.  OT intervention with focus on functional transfers, sit<>stand, standing balance, LUE gross and FMC, activity tolerance, cognitive remediation, and safety awareness to increase independence with BADLs. Pt requires min A for squat pivot transfers with mod verbal cues for sequencing and safety.  Pt engaged in table activities/tasks seated unsupported.  Box and Block test administered: RUE-23 blocks, LUE-22 blocks; 9 hole peg test administered: RUE-27.86 secs, LUE-34.29 secs.  Pt also replicated simple pattern with colored pegs while standing.  Pt constructed 2 simple structures with PVC piping while standing.  Pt incorporated BUE use of hands to complete task.  Pt requires min verbal cues to attend to LUE when not engaged in functional task.  Pt returned to room and remained in w/c with all needs within reach and belt alarm activated.  Family present.   Therapy Documentation Precautions:  Precautions Precautions: Fall Restrictions Weight Bearing Restrictions: No  Pain:  Pt denies pain   Therapy/Group: Individual Therapy  Rich BraveLanier, Ayushi Pla Chappell 03/13/2018, 2:14 PM

## 2018-03-13 NOTE — Progress Notes (Signed)
Physical Therapy Session Note  Patient Details  Name: [name] MRN: [id number] Date of Birth: February 27, 1966  Today's Date: 03/13/2018 PT Individual Time: 1425-1525 PT Individual Time Calculation (min): 60 min   Short Term Goals: Week 1:  PT Short Term Goal 1 (Week 1): Pt will ambulate 2350' in w/ CGA PT Short Term Goal 2 (Week 1): Pt will maintain dynamic standing balance w/ min assist PT Short Term Goal 3 (Week 1): Pt will attend to L body during functional mobility w/o cues 50% of the time  PT Short Term Goal 4 (Week 1): Pt will perform be<>chair transfers w/ CGA  Skilled Therapeutic Interventions/Progress Updates:    family had brought clothes in and pt wanting to stand to pull up pants. Required min assist or sit <> stand and max assist to control descent when LOB occurred. Educated on keeping one hand on RW at this time due to balance impairments. Pt verbalized understanding. Stand step with RW and short gait to mat table in gym with min assist overall and verbal and tactile cues for positioning of RW. NMR for blocked practice sit <> stands without UE support and then with bias to L side with 2" block under RLE x 5 reps each with min assist and verbal cues for technique. Gait training with RW x 8165' with focus on technique - pt with poor carryover of cues for positioning of RW, decreased awareness of positioning of feet in space, narrow BOS, and easily distracted during mobility in quiet environment. Required min assist overall. In supine focused on NMR for bridging with 5 sec hold with BLE and then with modified position to bias to LLE. In quadruped worked on cat/camel stretch to improve overall mobility and progressed to modifed crawl position x 5 steps forward and backwards x 2 reps each with assist needed for LLE going backwards. Impaired motor planning noted with ability to transition between positions (rolling to prone, quadruped and back to seated position EOM). Overall requires supervision  to min physical assist. Nustep for NMR for reciprocal movement pattern retraining x 8 min on level 6. End of session transferred back to bed with min assist using RW but increased time and multimodal cues for safe technique and attention to positioning of LLE during transitions.   Therapy Documentation Precautions:  Precautions Precautions: Fall Restrictions Weight Bearing Restrictions: No  Pain:  No pain.    Therapy/Group: Individual Therapy  Karolee StampsGray, Topaz Raglin Darrol PokeBrescia  Zemira Zehring B. Larenz Frasier, PT, DPT, CBIS  03/13/2018, 3:33 PM

## 2018-03-13 NOTE — Plan of Care (Signed)
  Problem: RH BLADDER ELIMINATION Goal: RH STG MANAGE BLADDER WITH ASSISTANCE Description: STG Manage Bladder With min Assistance Outcome: Not Progressing;incontinence   

## 2018-03-13 NOTE — Progress Notes (Signed)
SW acknowledging consult for patient, [facility] on 03/11/18. Patient will remain admitted to inpatient rehab until medically stable for discharge. SW signing off, please place additional consult if SW needs arise.  Edwin Dadaarol Hank Walling, MSW Medical Social Worker [place]

## 2018-03-13 NOTE — Progress Notes (Signed)
[name] is a 52 y.o. female 02-Nov-1965 [id number]  Subjective: Patient in bedside wheelchair, no family present at time of my visit.  She denies problem or concern and has no questions.  Objective: Vital signs in last 24 hours: Temp:  [97.5 F (36.4 C)-98 F (36.7 C)] 98 F (36.7 C) (11/24 0422) Pulse Rate:  [79-99] 79 (11/24 0422) Resp:  [18] 18 (11/24 0422) BP: (167-184)/(88-94) 184/88 (11/24 0422) SpO2:  [93 %-97 %] 97 % (11/24 0422) Weight change:  Last BM Date: 03/13/18  Intake/Output from previous day: 11/23 0701 - 11/24 0700 In: 720 [P.O.:720] Out: -   Physical Exam General: No apparent distress   sitting in bedside wheelchair, happily preparing to engage in craft activity with therapy Lungs: Normal effort. Lungs clear to auscultation, no crackles or wheezes. Cardiovascular: Regular rate and rhythm, no edema Neurological: No new neurological deficits   Lab Results: BMET    Component Value Date/Time   NA 137 03/10/2018 0442   K 4.1 03/10/2018 0442   CL 108 03/10/2018 0442   CO2 19 (L) 03/10/2018 0442   GLUCOSE 155 (H) 03/10/2018 0442   BUN 20 03/10/2018 0442   CREATININE 1.67 (H) 03/10/2018 0442   CALCIUM 8.7 (L) 03/10/2018 0442   GFRNONAA 35 (L) 03/10/2018 0442   GFRAA 40 (L) 03/10/2018 0442   CBC    Component Value Date/Time   WBC 8.9 03/10/2018 0442   RBC 4.84 03/10/2018 0442   HGB 13.8 03/10/2018 0442   HCT 41.9 03/10/2018 0442   PLT 262 03/10/2018 0442   MCV 86.6 03/10/2018 0442   MCH 28.5 03/10/2018 0442   MCHC 32.9 03/10/2018 0442   RDW 12.8 03/10/2018 0442   LYMPHSABS 2.4 03/09/2018 0448   MONOABS 0.6 03/09/2018 0448   EOSABS 0.4 03/09/2018 0448   BASOSABS 0.1 03/09/2018 0448   CBG's (last 3):   Recent Labs    03/12/18 1642 03/12/18 2114 03/13/18 0625  GLUCAP 176* 160* 223*   LFT's Lab Results  Component Value Date   ALT 63 (H) 03/09/2018   AST 59 (H) 03/09/2018   ALKPHOS QUANTITY NOT SUFFICIENT, UNABLE TO PERFORM TEST  03/09/2018   BILITOT 0.4 03/09/2018    Studies/Results: No results found.  Medications:  I have reviewed the patient's current medications. Scheduled Medications: . aspirin EC  81 mg Oral Daily  . atorvastatin  80 mg Oral QHS  . clopidogrel  75 mg Oral Daily  . DULoxetine  60 mg Oral Daily  . insulin aspart  0-20 Units Subcutaneous TID WC  . insulin glargine  30 Units Subcutaneous QHS  . levETIRAcetam  500 mg Oral BID  . QUEtiapine  50 mg Oral QHS   PRN Medications: acetaminophen **OR** acetaminophen, bisacodyl, ondansetron **OR** ondansetron (ZOFRAN) IV, senna-docusate  Assessment/Plan: Active Problems:   Type 2 diabetes mellitus (HCC)   Hypothyroidism   HTN (hypertension)   Dyslipidemia   Thrombotic cerebral infarction (HCC)   CKD (chronic kidney disease) stage 2, GFR 60-89 ml/min   OSA on CPAP   1.  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  Outpatient interventional radiology follow-up for cerebral arteriogram to address chronically occluded right ACA and severe stenosis of right MCA.  Currently admitted to inpatient intensive rehab for focused PT, OT and speech therapy with medical management as ongoing. 2.  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  Controlled at present.  Continue Keppra as prescribed. 3.  Type 2 diabetes with peripheral neuropathy.  Uncontrolled with hemoglobin A1c 12.9 prior to admission.  Continue Lantus with sliding scale as ordered.  Diabetic education with nutritional counseling ongoing. 4.  Chronic kidney disease stage II.  Creatinine stable 5.  Hypertension, essential.  Currently with permissive hypertension following acute stroke.  Continue medications with titration as needed. 6.  Dyslipidemia.  Continue statin as prescribed. 7.  Hypothyroidism.  Continue replacement as ordered. 8.  Obstructive sleep apnea on CPAP prior to admission  Will  discontinue peripheral IV given adequate oral intake and no need for IV medications on MAR  Length of stay, days: 2  Valerie A. Felicity Coyer, MD 03/13/2018, 10:33 AM

## 2018-03-13 NOTE — Progress Notes (Signed)
Occupational Therapy Session Note  Patient Details  Name: [name] MRN: [id number] Date of Birth: 05/29/1965  Today's Date: 03/13/2018 OT Individual Time: 2346-8873 OT Individual Time Calculation (min): 55 min   Short Term Goals: Week 1:  OT Short Term Goal 1 (Week 1): Pt will perform shower transfer with min A. OT Short Term Goal 2 (Week 1): Pt will perform clothing management in standing with min A for standing balance.  OT Short Term Goal 3 (Week 1): Pt will perform toilet transfer with min A.  Skilled Therapeutic Interventions/Progress Updates:    Pt greeted in w/c. ADL needs met. Tx focus placed on standing endurance, Lt attention, and Lt NMR during meaningful leisure participation. Used pts art supplies in room, and set her up with extra art supplies from unit. Pt stood with steady assist-supervision with RW during artmaking. Mod vcs required for midline orientation due to Lt lean. Pt incorporating B UEs to emboss rubber stamp of butterfly. Longest standing window without rest 34 minutes, with posture much improved during last 20 minutes of standing. She then returned to w/c and was left with all needs within reach and safety belt fastened.   Therapy Documentation Precautions:  Precautions Precautions: Fall Restrictions Weight Bearing Restrictions: No Pain: Pt denied pain    ADL:       Therapy/Group: Individual Therapy  Datron Brakebill A Genavie Boettger 03/13/2018, 12:29 PM

## 2018-03-14 ENCOUNTER — Other Ambulatory Visit: Payer: Self-pay

## 2018-03-14 ENCOUNTER — Inpatient Hospital Stay (HOSPITAL_COMMUNITY): Payer: 59 | Admitting: Occupational Therapy

## 2018-03-14 ENCOUNTER — Inpatient Hospital Stay (HOSPITAL_COMMUNITY): Payer: 59

## 2018-03-14 ENCOUNTER — Inpatient Hospital Stay (HOSPITAL_COMMUNITY): Payer: 59 | Admitting: Physical Therapy

## 2018-03-14 ENCOUNTER — Encounter (HOSPITAL_COMMUNITY): Payer: Self-pay | Admitting: Interventional Radiology

## 2018-03-14 DIAGNOSIS — I63311 Cerebral infarction due to thrombosis of right middle cerebral artery: Secondary | ICD-10-CM

## 2018-03-14 DIAGNOSIS — E785 Hyperlipidemia, unspecified: Secondary | ICD-10-CM

## 2018-03-14 DIAGNOSIS — I639 Cerebral infarction, unspecified: Secondary | ICD-10-CM

## 2018-03-14 DIAGNOSIS — G8114 Spastic hemiplegia affecting left nondominant side: Secondary | ICD-10-CM

## 2018-03-14 LAB — CBC WITH DIFFERENTIAL/PLATELET
Abs Immature Granulocytes: 0.03 10*3/uL (ref 0.00–0.07)
Basophils Absolute: 0.1 10*3/uL (ref 0.0–0.1)
Basophils Relative: 1 %
Eosinophils Absolute: 0.7 10*3/uL — ABNORMAL HIGH (ref 0.0–0.5)
Eosinophils Relative: 8 %
HEMATOCRIT: 40.2 % (ref 36.0–46.0)
HEMOGLOBIN: 12.7 g/dL (ref 12.0–15.0)
Immature Granulocytes: 0 %
LYMPHS PCT: 22 %
Lymphs Abs: 2.1 10*3/uL (ref 0.7–4.0)
MCH: 27.9 pg (ref 26.0–34.0)
MCHC: 31.6 g/dL (ref 30.0–36.0)
MCV: 88.2 fL (ref 80.0–100.0)
Monocytes Absolute: 0.7 10*3/uL (ref 0.1–1.0)
Monocytes Relative: 7 %
Neutro Abs: 5.8 10*3/uL (ref 1.7–7.7)
Neutrophils Relative %: 62 %
Platelets: 324 10*3/uL (ref 150–400)
RBC: 4.56 MIL/uL (ref 3.87–5.11)
RDW: 12.6 % (ref 11.5–15.5)
WBC: 9.4 10*3/uL (ref 4.0–10.5)
nRBC: 0 % (ref 0.0–0.2)

## 2018-03-14 LAB — COMPREHENSIVE METABOLIC PANEL
ALK PHOS: 333 U/L — AB (ref 38–126)
ALT: 55 U/L — ABNORMAL HIGH (ref 0–44)
ANION GAP: 6 (ref 5–15)
AST: 40 U/L (ref 15–41)
Albumin: 2.5 g/dL — ABNORMAL LOW (ref 3.5–5.0)
BILIRUBIN TOTAL: 0.8 mg/dL (ref 0.3–1.2)
BUN: 24 mg/dL — ABNORMAL HIGH (ref 6–20)
CALCIUM: 8.7 mg/dL — AB (ref 8.9–10.3)
CO2: 27 mmol/L (ref 22–32)
Chloride: 106 mmol/L (ref 98–111)
Creatinine, Ser: 1.7 mg/dL — ABNORMAL HIGH (ref 0.44–1.00)
GFR calc non Af Amer: 34 mL/min — ABNORMAL LOW (ref >60)
GFR, EST AFRICAN AMERICAN: 39 mL/min — AB (ref >60)
Glucose, Bld: 180 mg/dL — ABNORMAL HIGH (ref 70–99)
POTASSIUM: 3.8 mmol/L (ref 3.5–5.1)
SODIUM: 139 mmol/L (ref 135–145)
TOTAL PROTEIN: 5.9 g/dL — AB (ref 6.5–8.1)

## 2018-03-14 LAB — GLUCOSE, CAPILLARY
GLUCOSE-CAPILLARY: 223 mg/dL — AB (ref 70–99)
GLUCOSE-CAPILLARY: 158 mg/dL — AB (ref 70–99)
GLUCOSE-CAPILLARY: 174 mg/dL — AB (ref 70–99)
Glucose-Capillary: 166 mg/dL — ABNORMAL HIGH (ref 70–99)

## 2018-03-14 NOTE — Progress Notes (Signed)
Patient information reviewed and entered into eRehab system by Onyx Schirmer, RN, CRRN, PPS Coordinator.  Information including medical coding, functional ability and quality indicators will be reviewed and updated through discharge.    

## 2018-03-14 NOTE — Progress Notes (Addendum)
[place] PHYSICAL MEDICINE & REHABILITATION PROGRESS NOTE   Subjective/Complaints:  Working with SLP on memory No c/os  ROS- denies CP, SOB, N/V/D  Objective:   No results found. No results for input(s): WBC, HGB, HCT, PLT in the last 72 hours. No results for input(s): NA, K, CL, CO2, GLUCOSE, BUN, CREATININE, CALCIUM in the last 72 hours.  Intake/Output Summary (Last 24 hours) at 03/14/2018 0841 Last data filed at 03/14/2018 0819 Gross per 24 hour  Intake 960 ml  Output -  Net 960 ml     Physical Exam: Vital Signs Blood pressure (!) 181/99, pulse 78, temperature 97.9 F (36.6 C), temperature source Oral, resp. rate 16, height 5\' 5"  (1.651 m), weight 102.4 kg, SpO2 93 %.  General: No acute distress Mood and affect are appropriate Heart: Regular rate and rhythm no rubs murmurs or extra sounds Lungs: Clear to auscultation, breathing unlabored, no rales or wheezes Abdomen: Positive bowel sounds, soft nontender to palpation, nondistended Extremities: No clubbing, cyanosis, or edema Skin: No evidence of breakdown, no evidence of rash Neurologic: Cranial nerves II through XII intact, motor strength is 5/5 in right and 4/5 left deltoid, bicep, tricep, grip, hip flexor, knee extensors, ankle dorsiflexor and plantar flexor Sensory exam normal sensation to light touch and proprioception in bilateral upper and lower extremities Cerebellar exam normal finger to nose to finger as well as heel to shin in bilateral upper and lower extremities Musculoskeletal: Full range of motion in all 4 extremities. No joint swelling    Assessment/Plan: 1. 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  which require 3+ hours per day of interdisciplinary therapy in a comprehensive inpatient rehab setting.  Physiatrist is providing close team supervision and 24 hour management of active medical problems listed below.  Physiatrist and rehab team continue to assess barriers to discharge/monitor  patient progress toward functional and medical goals  Care Tool:  Bathing  Bathing activity did not occur: Refused Body parts bathed by patient: Right arm, Left arm, Chest, Abdomen, Front perineal area, Right upper leg, Left upper leg, Face   Body parts bathed by helper: Right lower leg, Left lower leg, Buttocks     Bathing assist Assist Level: Moderate Assistance - Patient 50 - 74%     Upper Body Dressing/Undressing Upper body dressing   What is the patient wearing?: Dress    Upper body assist Assist Level: Supervision/Verbal cueing    Lower Body Dressing/Undressing Lower body dressing      What is the patient wearing?: Incontinence brief     Lower body assist Assist for lower body dressing: Maximal Assistance - Patient 25 - 49%     Toileting Toileting    Toileting assist Assist for toileting: Minimal Assistance - Patient > 75%     Transfers Chair/bed transfer  Transfers assist  Chair/bed transfer activity did not occur: Safety/medical concerns  Chair/bed transfer assist level: Moderate Assistance - Patient 50 - 74%     Locomotion Ambulation   Ambulation assist      Assist level: Minimal Assistance - Patient > 75% Assistive device: Walker-rolling Max distance: 65'   Walk 10 feet activity   Assist     Assist level: Minimal Assistance - Patient > 75% Assistive device: Walker-rolling   Walk 50 feet activity   Assist    Assist level: Minimal Assistance - Patient > 75% Assistive device: Walker-rolling    Walk 150 feet activity   Assist Walk 150 feet activity did not occur: Safety/medical concerns  Walk 10 feet on uneven surface  activity   Assist Walk 10 feet on uneven surfaces activity did not occur: Safety/medical concerns         Wheelchair     Assist Will patient use wheelchair at discharge?: (tbd) Type of Wheelchair: Manual    Wheelchair assist level: Supervision/Verbal cueing Max wheelchair distance: 48'     Wheelchair 50 feet with 2 turns activity    Assist        Assist Level: Supervision/Verbal cueing   Wheelchair 150 feet activity     Assist Wheelchair 150 feet activity did not occur: Safety/medical concerns        Medical Problem List and Plan: 1.Left hemiparesissecondary to Right ACA infarctions/multivessel intracranial disease.follow-up outpatient interventional radiology for planned cerebral arteriogram to address occluded right ACA junction/severe stenosis right MCA CIR PT, OT SLP 2. DVT Prophylaxis/Anticoagulation: SCDs. 3. Pain Management:Cymbalta 60 mg daily 4. Mood:Seroquel 50 mg daily at bedtime 5. Neuropsych: This patientiscapable of making decisions on herown behalf. 6. Skin/Wound Care:Routine skin checks 7. Fluids/Electrolytes/Nutrition:Routine and out's with follow-up chemistries 8. Seizure disorder. Keppra 500 mg twice a day 9.Diabetes mellitus with peripheral neuropathy. Hemoglobin A1c 12.9.Lantus insulin 30 units daily at bedtime. Check blood sugars before meals and at bedtime. Diabetic teaching CBG (last 3)  Recent Labs    03/13/18 1640 03/13/18 2109 03/14/18 0645  GLUCAP 195* 251* 166*  elevated in am will adjust lantus- may need to add meal time coverage Vitals:   03/13/18 1932 03/14/18 0500  BP: (!) 155/89 (!) 181/99  Pulse: 89 78  Resp: (!) 22 16  Temp: 99 F (37.2 C) 97.9 F (36.6 C)  SpO2: 93% 93%  Elevated SBP- permissive for the next 1 wk   10. CKD stage II. Follow-up chemistries CMET today11. Hypertension. Permissive hypertension. Patient on Norvasc 10 mg daily, Coreg 25 mg twice a day,hygroton 25 mg daily, hydralazine 50 mg 3 times a day, lisinopril 40 mg daily prior to admission. Resume as needed 12. Hyperlipidemia. Lipitor    LOS: 3 days A FACE TO FACE EVALUATION WAS PERFORMED  [name] 03/14/2018, 8:41 AM

## 2018-03-14 NOTE — Patient Outreach (Signed)
[facility] Care Management  03/14/2018  [name] 04-07-1966 [id number]     EMMI-Stroke RED ON EMMI ALERT Day # 9 Date: 03/13/18 Red Alert Reason: " Lost interest in things they used to enjoy? Yes  Sad,hoepless,anxious,or empty? Yes"     Red on EMMI dashboard alert received. Per chart review, patient noted to be inpatient and admitted to the inpatient rehab on 03/11/18. Patient is not appropriate for post discharge automated EMMI-Stroke calls.     Plan: RN CM will notify [facility] of case status and to deactivate automated calls.   Antionette Fairyoshanda [place] Breeding, RN,BSN,CCM [facility] Care Management Telephonic Care Management Coordinator Direct Phone: [phone] Toll Free: [phone] Fax: [phone]

## 2018-03-14 NOTE — Progress Notes (Signed)
Social Work  Social Work Assessment and Plan  Patient Details  Name: [name] MRN: [id number] Date of Birth: [birth year]  Today's Date: 03/14/2018  Problem List:  Patient Active Problem List   Diagnosis Date Noted  . CKD (chronic kidney disease) stage 2, GFR 60-89 ml/min 03/12/2018  . OSA on CPAP 03/12/2018  . Thrombotic cerebral infarction (HCC) 03/11/2018  . Seizures (HCC) 03/08/2018  . Non compliance w medication regimen   . Dyslipidemia 03/03/2018  . Acute CVA (cerebrovascular accident) (HCC) 03/02/2018  . Type 2 diabetes mellitus (HCC) 03/25/2015  . Hypothyroidism 03/25/2015  . HTN (hypertension) 03/25/2015  . RLS (restless legs syndrome) 03/25/2015  . History of stroke 11/30/2014  . Obesity, morbid, BMI 40.0-49.9 (HCC) 11/30/2014  . Pure hypertriglyceridemia 11/30/2014   Past Medical History:  Past Medical History:  Diagnosis Date  . Anxiety   . Chronic kidney disease   . Diabetes mellitus without complication (HCC)   . Fibromyalgia   . Headache   . History of kidney stones   . Hyperlipemia   . Hypertension   . Hypothyroidism   . Insomnia   . Pancreatitis    chronic  . Sleep apnea   . Stroke (cerebrum) (HCC)   . Thrombotic cerebral infarction [facility]) 03/11/2018   Past Surgical History:  Past Surgical History:  Procedure Laterality Date  . ABDOMINAL HYSTERECTOMY    . CHOLECYSTECTOMY    . IR ANGIO INTRA EXTRACRAN SEL COM CAROTID INNOMINATE BILAT MOD SED  03/09/2018  . IR ANGIO VERTEBRAL SEL SUBCLAVIAN INNOMINATE UNI R MOD SED  03/09/2018  . IR ANGIO VERTEBRAL SEL VERTEBRAL UNI L MOD SED  03/09/2018  . ROTATOR CUFF REPAIR Right   . SHOULDER SURGERY Left    Social History:  reports that she has never smoked. She has never used smokeless tobacco. She reports that she does not drink alcohol or use drugs.  Family / Support Systems Marital Status: Married Patient Roles: Spouse, Parent, Other (Comment)(employee) Spouse/Significant Other: Greig Castilla  [phone] Children: Two grown children-son and daughter who work and go to school but live with them Other Supports: Friends Anticipated Caregiver: Husband and children Ability/Limitations of Caregiver: Will need to come up with a plan currently do not have one Caregiver Availability: Other (Comment)(Coming up with a plan for DC) Family Dynamics: Close knit family who rely upon one another. Pt is still processing all of this and what has happened to her. They do have freinds and church members who are supportive.   Social History Preferred language: English Religion:  Cultural Background: No issues Education: High School Read: Yes Write: Yes Employment Status: Employed Name of Employer: Publishing copy time Return to Work Plans: Would like to return but is not sure at this time Fish farm manager Issues: No issues Guardian/Conservator: None-according to MD pt is capable of making her own decisions while here   Abuse/Neglect Abuse/Neglect Assessment Can Be Completed: Yes Physical Abuse: Denies Verbal Abuse: Denies Sexual Abuse: Denies Exploitation of patient/patient's resources: Denies Self-Neglect: Denies  Emotional Status Pt's affect, behavior adn adjustment status: Pt is motivated to recover and regain her independence. She had a stroke on 2014 and still had some left residual deficits, but she managed and was still independent. She hopes to get back to this level and not need someone there with her. Recent Psychosocial Issues: other health issues-somewhat non-compliant which she knows now is wrong and will cause her more health issues Pyschiatric History: History of anxiety takes medications for this and finds it  helpful. Would benefit from seeing neuro-psych while here will place on list to be seen while here Substance Abuse History: Tobacco aware now needs to quit and plans on doing that while here and once goes home. She is aware of the smoking cessation  resources  Patient / Family Perceptions, Expectations & Goals Pt/Family understanding of illness & functional limitations: Pt and husband can explain her stroke and deficits. She has improved which is encouraging and makes pt hopeful. She does ask MD her questions and feels her concerns are being addressed.  Pt wants to do well and get back home soon. Premorbid pt/family roles/activities: Wife, Mother, employee, friend, church member, etc Anticipated changes in roles/activities/participation: resume Pt/family expectations/goals: Pt states: " I want to be able to take care of myself before I leave here." Husband states: " We will do what we can and help her."  [employer] Community Agencies: Other (Comment) Premorbid Home Care/DME Agencies: Other (Comment)([facility] was seeing prior to admission here) Transportation available at discharge: Family Resource referrals recommended: Neuropsychology, Support group (specify)  Discharge Planning Living Arrangements: Spouse/significant other, Children Support Systems: Spouse/significant other, Children, Friends/neighbors, Church/faith community Type of Residence: Private residence Insurance Resources: Media plannerrivate Insurance (specify)(Aetna) Surveyor, quantityinancial Resources: Employment, Garment/textile technologistamily Support Financial Screen Referred: No Living Expenses: Database administratorMotgage Money Management: Spouse, Patient Does the patient have any problems obtaining your medications?: No Home Management: Self Patient/Family Preliminary Plans: Return home with family between all of them they hope to be able to provide almost 24 hr supervision. Husband does work first shift and both children go to school and work, but winter break is coming up. Team feels she will reach supervision level. Will work on discharge plans. Social Work Anticipated Follow Up Needs: HH/OP, Support Group  Clinical Impression Pleasant quiet female who is motivated to do well and wants to regain her independence back from this  stroke. Pt would benefit from seeing neuro-psych while here for her anxiety and coping. Supportive family who are involved and supportive. Will work on discharge needs.  Lucy Chrisupree, Doryan Bahl G 03/14/2018, 10:37 AM

## 2018-03-14 NOTE — Progress Notes (Signed)
Physical Therapy Session Note  Patient Details  Name: [name] MRN: [id number] Date of Birth: 12-02-1965  Today's Date: 03/14/2018 PT Individual Time: 0907-1003 PT Individual Time Calculation (min): 56 min   Short Term Goals: Week 1:  PT Short Term Goal 1 (Week 1): Pt will ambulate 2550' in w/ CGA PT Short Term Goal 2 (Week 1): Pt will maintain dynamic standing balance w/ min assist PT Short Term Goal 3 (Week 1): Pt will attend to L body during functional mobility w/o cues 50% of the time  PT Short Term Goal 4 (Week 1): Pt will perform be<>chair transfers w/ CGA  Skilled Therapeutic Interventions/Progress Updates:  Pt received in bed & agreeable to tx, denying c/o pain. Pt transfers to EOB with supervision & hospital bed features and completes stand pivot bed>w/c with min assist. Transported pt to gym via w/c dependent assist for time management. Pt completes sit<>stand with CGA/min assist with cuing for hand placement. Pt ambulates 56 ft + 58 ft with RW & min assist with L lateral lean and pt very distracted and observing environment despite max cuing for sustained attention to task. Pt demonstrates inconsistent step length BLE. Pt reports 7/10 fatigue after 2nd ambulation trial. In dayroom, pt completed stand pivot w/c<>kinetron with min assist and more than reasonable amount of time. Pt utilized kinetron in sitting with max cuing for sustained attention to task x 1 minute + 1 minute with task focusing on BLE strengthening & LLE NMR. Progressed to using machine in standing with BUE despite encouragement to only hold with LUE with CGA for balance with task focusing on weight shifting L, dynamic balance, BLE strengthening, and LLE NMR. Pt stood on compliant surface with normal and narrow BOS without RUE support while engaging in peg board task, correctly assembling 2 designs without cuing with task focusing on forced use of LUE as pt frequently neglects extremity during session. At end of session  pt left sitting in w/c with chair alarm donned & call bell in reach, RN in room.  Therapy Documentation Precautions:  Precautions Precautions: Fall Restrictions Weight Bearing Restrictions: No    Therapy/Group: Individual Therapy  Sandi MariscalVictoria M Ela Moffat 03/14/2018, 10:04 AM

## 2018-03-14 NOTE — IPOC Note (Signed)
Overall Plan of Care [facility](IPOC) Patient Details Name: [name] MRN: [id number] DOB: May 16, 1965  Admitting Diagnosis: <principal problem not specified>  Hospital Problems: Active Problems:   Type 2 diabetes mellitus (HCC)   Hypothyroidism   HTN (hypertension)   Dyslipidemia   Thrombotic cerebral infarction (HCC)   CKD (chronic kidney disease) stage 2, GFR 60-89 ml/min   OSA on CPAP     Functional Problem List: Nursing Bladder, Endurance, Safety  PT Balance, Behavior, Endurance, Motor, Perception, Safety, Sensory  OT Balance, Cognition, Endurance, Motor, Pain, Safety  SLP Cognition  TR         Basic ADL's: OT Grooming, Bathing, Dressing, Toileting     Advanced  ADL's: OT Simple Meal Preparation, Laundry     Transfers: PT Bed Mobility, Bed to Chair, Car, State Street CorporationFurniture, Civil [employer] fast [employer]  OT Toilet, Tub/Shower     Locomotion: PT Stairs, Psychologist, prison and probation servicesWheelchair Mobility, Ambulation     Additional Impairments: OT Fuctional Use of Upper Extremity  SLP Social Cognition   Memory  TR      Anticipated Outcomes Item Anticipated Outcome  Self Feeding n/a  Swallowing      Basic self-care  supervision  Toileting  supervision   Bathroom Transfers supervision  Bowel/Bladder  Continent to bowel and bladder with min. assist.  Transfers  supervision   Locomotion  supervision w/ LRAD, household ambulation   Communication     Cognition  supervision   Pain  Less than 3,on 1 to 10 scale.  Safety/Judgment  Free from falls during her stay in rehab   Therapy Plan: PT Intensity: Minimum of 1-2 x/day ,45 to 90 minutes PT Frequency: 5 out of 7 days PT Duration Estimated Length of Stay: 10-14 days OT Intensity: Minimum of 1-2 x/day, 45 to 90 minutes OT Frequency: 5 out of 7 days OT Duration/Estimated Length of Stay: 10-14 days SLP Intensity: Minumum of 1-2 x/day, 30 to 90 minutes SLP Frequency: 1 to 3 out of 7 days SLP Duration/Estimated Length of Stay: 1-2 additional visits    Team  Interventions: Nursing Interventions Patient/Family Education, Disease Management/Prevention, Bladder Management, Discharge Planning  PT interventions Ambulation/gait training, Discharge planning, Functional mobility training, Psychosocial support, Therapeutic Activities, Visual/perceptual remediation/compensation, Wheelchair propulsion/positioning, Therapeutic Exercise, Skin care/wound management, Neuromuscular re-education, Balance/vestibular training, Disease management/prevention, Cognitive remediation/compensation, DME/adaptive equipment instruction, Splinting/orthotics, Pain management, UE/LE Strength taining/ROM, UE/LE Coordination activities, Stair training, Patient/family education, Functional electrical stimulation, Community reintegration  OT Interventions Warden/rangerBalance/vestibular training, Patient/family education, Therapeutic Activities, DME/adaptive equipment instruction, Cognitive remediation/compensation, Functional electrical stimulation, Psychosocial support, Therapeutic Exercise, Functional mobility training, Self Care/advanced ADL retraining, UE/LE Strength taining/ROM, Discharge planning, Neuromuscular re-education, UE/LE Coordination activities  SLP Interventions Cognitive remediation/compensation, Cueing hierarchy, Functional tasks, Internal/external aids, Environmental controls, Patient/family education  TR Interventions    SW/CM Interventions Discharge Planning, Psychosocial Support, Patient/Family Education   Barriers to Discharge MD  Medical stability  Nursing      PT Decreased caregiver support husband works during the day   OT Decreased caregiver support    SLP      SW       Team Discharge Planning: Destination: PT-Home ,OT- Home , SLP-Home Projected Follow-up: PT-Home health PT, OT-  24 hour supervision/assistance, Outpatient OT, SLP-None Projected Equipment Needs: PT-To be determined, OT- To be determined, SLP-None recommended by SLP Equipment Details: PT-has RW from  previous admission, OT-  Patient/family involved in discharge planning: PT- Patient, Patient unable/family or caregiver not available,  OT-Patient, SLP-Patient  MD ELOS: 15-22d Medical Rehab Prognosis:  Good Assessment:  52 year old  right-handed female with history of hypertension, OSA with CPAP, diabetes mellitus, hyperlipidemia, CKD stage II, fibromyalgia, previous CVA 2014 with residual left-sided weakness and noncompliant with medications as well as recent admission 03/02/2018 to 03/04/2018 for increasing left side weakness as well as reported new onset seizure and was started on Keppra.Marland Kitchen MRI at that time showed right posterior ACA territory infarctions and maintain on aspirin and Plavix.EEG was negative for seizure. She was discharged to home with home health services after declining skilled nursing facility ambulating minimal assistance 80 feet with a rolling walker. She lives with her husband who works during the day. Patient presented [facility] 03/08/2018 with increasing left lower extremity weakness and blurred vision. Cranial CT scan completed in the ED showing subacute infarction right ACA territory remote right occipital and inferior cerebellar infarct without intracranial hemorrhage. MRI showed slight interval expansion worsening of the right ACA territory infarction as compared to previous studies. 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 Neurology follow-up currently planned is to continue aspirin and Plavix therapy and follow-up 3 months with interventional radiology for repeat arteriogram   Now requiring 24/7 Rehab RN,MD, as well as CIR level PT, OT and  SLP.  Treatment team will focus on ADLs and mobility with goals set at Sup See Team Conference Notes for weekly updates to the plan of care

## 2018-03-14 NOTE — Progress Notes (Signed)
Occupational Therapy Session Note  Patient Details  Name: [name] MRN: [id number] Date of Birth: Sep 27, 1965  Today's Date: 03/14/2018 OT Individual Time: [id number] OT Individual Time Calculation (min): 70 min    Short Term Goals: Week 1:  OT Short Term Goal 1 (Week 1): Pt will perform shower transfer with min A. OT Short Term Goal 2 (Week 1): Pt will perform clothing management in standing with min A for standing balance.  OT Short Term Goal 3 (Week 1): Pt will perform toilet transfer with min A.  Skilled Therapeutic Interventions/Progress Updates:    Treatment session with focus on ADL retraining with functional transfers, dynamic standing balance, Lt attention, and LUE NMR. Pt received upright in w/c reporting desire to shower. Ambulated to bathroom with RW with contact guard.  Completed transfers to toilet and tub bench in room shower with min cues for safety as pt demonstrating Lt inattention and mild impulsivity.  Pt with 1 LOB when entering shower due to inattention to LLE as stepping around tub bench.  Pt completed bathing at overall min assist - supervision at sit > stand level.  Pt completed dressing with assistance to apply incontinence brief then supervision for all LB dressing.  Engaged in [employer] activity in standing with focus on sustained attention to task in mild-moderately distracting environment.  Pt with LOB requiring mod assist to correct after commenting on commotion in hallway.  Engaged in extensive education regarding attention and how to minimize distractions during functional and higher level cognitive tasks to increase success as pt extremely easily distracted.  Pt reports understanding.  Returned to room and left seated upright in w/c with seat belt alarm on and all needs in reach.  Therapy Documentation Precautions:  Precautions Precautions: Fall Restrictions Weight Bearing Restrictions: No General:   Vital Signs: Therapy Vitals Temp: 97.9 F (36.6  C) Pulse Rate: (!) 101 Resp: 18 BP: (!) 157/99 Patient Position (if appropriate): Sitting Oxygen Therapy SpO2: 97 % O2 Device: Room Air Pain:  Pt with no c/o pain   Therapy/Group: Individual Therapy  Rosalio LoudHOXIE, Keltie Labell 03/14/2018, 3:12 PM

## 2018-03-14 NOTE — Care Management Note (Signed)
[facility] Individual Statement of Services  Patient Name:  [name]  Date:  03/14/2018  Welcome to the [facility]patient Rehabilitation Center.  Our goal is to provide you with an individualized program based on your diagnosis and situation, designed to meet your specific needs.  With this comprehensive rehabilitation program, you will be expected to participate in at least 3 hours of rehabilitation therapies Monday-Friday, with modified therapy programming on the weekends.  Your rehabilitation program will include the following services:  Physical Therapy (PT), Occupational Therapy (OT), Speech Therapy (ST), 24 hour per day rehabilitation nursing, Therapeutic Recreaction (TR), Neuropsychology, Case Management (Social Worker), Rehabilitation Medicine, Nutrition Services and Pharmacy Services  Weekly team conferences will be held on Wednesday to discuss your progress.  Your Social Worker will talk with you frequently to get your input and to update you on team discussions.  Team conferences with you and your family in attendance may also be held.  Expected length of stay:10-14 days Overall anticipated outcome: supervision level with cues  Depending on your progress and recovery, your program may change. Your Social Worker will coordinate services and will keep you informed of any changes. Your Social Worker's name and contact numbers are listed  below.  The following services may also be recommended but are not provided by the Inpatient Rehabilitation Center:   Driving Evaluations  Home Health Rehabiltiation Services  Outpatient Rehabilitation Services  Vocational Rehabilitation   Arrangements will be made to provide these services after discharge if needed.  Arrangements include referral to agencies that provide these services.  Your insurance has been verified to be:  Community education officer[employer]etna Your primary doctor is:  Brent BullaLawrence Perry  Pertinent information will be shared with your doctor  and your insurance company.  Social Worker:  Dossie DerBecky Denean Pavon, SW [phone] or ([phone]) [phone]  Information discussed with and copy given to patient by: Lucy Chrisupree, Lasean Gorniak G, 03/14/2018, 10:41 AM

## 2018-03-14 NOTE — Progress Notes (Signed)
Speech Language Pathology Discharge Summary  Patient Details  Name: [name] MRN: [id number] Date of Birth: 1965-06-23  Today's Date: 03/14/2018 SLP Individual Time: 0802-0900 SLP Individual Time Calculation (min): 58 min   Skilled Therapeutic Interventions: Skilled ST services focused on cognitive skills and education. SLP facilitated recall of yesterday's therapy activities pt required supervision A verbal cues. SLP provided re-education of memory strategies, left in room, pt demonstrated ability tpo summarize strategies with supervision A verbal cues. SLP instructed pt in use of associations and visualizations prior to recall task, pt required supervision A verbal cues to recall association of word and object as well as recall 5 objects listed in each category Mod I. SLP provided small "memory notebook" to utilize in daily organization and while at work as recall strategy. All questions were answered to satisfaction. Pt was left in room with call bell within reach and bed alarm set. Recommend to continue skilled ST services.      Patient has met 1 of 1 long term goals.  Patient to discharge at overall Supervision level.  Reasons goals not met:     Clinical Impression/Discharge Summary:   Pt met 1 out 1 goals, discharging from ST services at supervision A level for recall. Pt appears at baseline pt pt and chart, however information is inconsistent about prior functioning ability. Pt demonstrated ability to utilize recall strategies at supervision A level. Pt benefited from skilled ST services in order to increase functional independence and reduce burden of care, requiring 24 hour supervision upon hospital discharge.   Care Partner:  Caregiver Able to Provide Assistance: Yes  Type of Caregiver Assistance: Physical;Cognitive  Recommendation:  None      Equipment:   N/A  Reasons for discharge: Treatment goals met   Patient/Family Agrees with Progress Made and Goals Achieved: Yes     [name]  [facility] 03/14/2018, 4:17 PM

## 2018-03-15 ENCOUNTER — Inpatient Hospital Stay (HOSPITAL_COMMUNITY): Payer: 59 | Admitting: Physical Therapy

## 2018-03-15 ENCOUNTER — Inpatient Hospital Stay (HOSPITAL_COMMUNITY): Payer: 59 | Admitting: Occupational Therapy

## 2018-03-15 ENCOUNTER — Encounter (HOSPITAL_COMMUNITY): Payer: Self-pay | Admitting: Radiology

## 2018-03-15 LAB — GLUCOSE, CAPILLARY
GLUCOSE-CAPILLARY: 114 mg/dL — AB (ref 70–99)
GLUCOSE-CAPILLARY: 156 mg/dL — AB (ref 70–99)
Glucose-Capillary: 201 mg/dL — ABNORMAL HIGH (ref 70–99)
Glucose-Capillary: 188 mg/dL — ABNORMAL HIGH (ref 70–99)

## 2018-03-15 MED ORDER — INSULIN GLARGINE 100 UNIT/ML ~~LOC~~ SOLN
33.0000 [IU] | Freq: Every day | SUBCUTANEOUS | Status: DC
  Administered 2018-03-15 – 2018-03-22 (×8): 33 [IU] via SUBCUTANEOUS
  Filled 2018-03-15 (×8): qty 0.33

## 2018-03-15 NOTE — Progress Notes (Signed)
[place] PHYSICAL MEDICINE & REHABILITATION PROGRESS NOTE   Subjective/Complaints:  Had episode of vomiting last noc, after dinner, does not think it was a med, pt received Zofran and has had no recurrence  ROS- denies CP, SOB, N/V/D  Objective:   No results found. Recent Labs    03/14/18 0657  WBC 9.4  HGB 12.7  HCT 40.2  PLT 324   Recent Labs    03/14/18 0657  NA 139  K 3.8  CL 106  CO2 27  GLUCOSE 180*  BUN 24*  CREATININE 1.70*  CALCIUM 8.7*    Intake/Output Summary (Last 24 hours) at 03/15/2018 0754 Last data filed at 03/14/2018 1707 Gross per 24 hour  Intake 1182 ml  Output -  Net 1182 ml     Physical Exam: Vital Signs Blood pressure (!) 178/97, pulse 81, temperature 97.8 F (36.6 C), temperature source Oral, resp. rate 18, height 5\' 5"  (1.651 m), weight 102.4 kg, SpO2 95 %.  General: No acute distress Mood and affect are appropriate Heart: Regular rate and rhythm no rubs murmurs or extra sounds Lungs: Clear to auscultation, breathing unlabored, no rales or wheezes Abdomen: Positive bowel sounds, soft nontender to palpation, nondistended Extremities: No clubbing, cyanosis, or edema Skin: No evidence of breakdown, no evidence of rash Neurologic: Cranial nerves II through XII intact, motor strength is 5/5 in right and 4/5 left deltoid, bicep, tricep, grip, hip flexor, knee extensors, ankle dorsiflexor and plantar flexor Sensory exam normal sensation to light touch and proprioception in bilateral upper and lower extremities Cerebellar exam normal finger to nose to finger as well as heel to shin in bilateral upper and lower extremities Musculoskeletal: Full range of motion in all 4 extremities. No joint swelling    Assessment/Plan: 1. 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  which require 3+ hours per day of interdisciplinary therapy in a comprehensive inpatient rehab setting.  Physiatrist is providing close team supervision and 24 hour  management of active medical problems listed below.  Physiatrist and rehab team continue to assess barriers to discharge/monitor patient progress toward functional and medical goals  Care Tool:  Bathing  Bathing activity did not occur: Refused Body parts bathed by patient: Right arm, Left arm, Chest, Abdomen, Front perineal area, Right upper leg, Left upper leg, Face   Body parts bathed by helper: Right lower leg, Left lower leg, Buttocks     Bathing assist Assist Level: Minimal Assistance - Patient > 75%     Upper Body Dressing/Undressing Upper body dressing   What is the patient wearing?: Bra, Pull over shirt    Upper body assist Assist Level: Supervision/Verbal cueing    Lower Body Dressing/Undressing Lower body dressing      What is the patient wearing?: Pants, Incontinence brief     Lower body assist Assist for lower body dressing: Moderate Assistance - Patient 50 - 74%     Toileting Toileting    Toileting assist Assist for toileting: Minimal Assistance - Patient > 75%     Transfers Chair/bed transfer  Transfers assist  Chair/bed transfer activity did not occur: Safety/medical concerns  Chair/bed transfer assist level: Minimal Assistance - Patient > 75%     Locomotion Ambulation   Ambulation assist      Assist level: Minimal Assistance - Patient > 75% Assistive device: Walker-rolling Max distance: 58 ft    Walk 10 feet activity   Assist     Assist level: Minimal Assistance - Patient > 75% Assistive device:  Walker-rolling   Walk 50 feet activity   Assist    Assist level: Minimal Assistance - Patient > 75% Assistive device: Walker-rolling    Walk 150 feet activity   Assist Walk 150 feet activity did not occur: Safety/medical concerns         Walk 10 feet on uneven surface  activity   Assist Walk 10 feet on uneven surfaces activity did not occur: Safety/medical concerns         Wheelchair     Assist Will patient  use wheelchair at discharge?: (tbd) Type of Wheelchair: Manual    Wheelchair assist level: Supervision/Verbal cueing Max wheelchair distance: 150'    Wheelchair 50 feet with 2 turns activity    Assist        Assist Level: Supervision/Verbal cueing   Wheelchair 150 feet activity     Assist Wheelchair 150 feet activity did not occur: Safety/medical concerns        Medical Problem List and Plan: 1.Left 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 DVT Prophylaxis/Anticoagulation: SCDs. 3. Pain Management:Cymbalta 60 mg daily 4. Mood:Seroquel 50 mg daily at bedtime 5. Neuropsych: This patientiscapable of making decisions on herown behalf. 6. Skin/Wound Care:Routine skin checks 7. Fluids/Electrolytes/Nutrition:Routine and out's with follow-up chemistries 8. Seizure disorder. Keppra 500 mg twice a day 9.Diabetes mellitus with peripheral neuropathy. Hemoglobin A1c 12.9.Lantus insulin 30 units daily at bedtime. Check blood sugars before meals and at bedtime. Diabetic teaching CBG (last 3)  Recent Labs    03/14/18 1631 03/14/18 2144 03/15/18 0636  GLUCAP 158* 174* 201*  elevated in am will adjust lantus- to 33U Vitals:   03/14/18 1951 03/15/18 0512  BP: (!) 132/115 (!) 178/97  Pulse: (!) 109 81  Resp: 18 18  Temp: 98.1 F (36.7 C) 97.8 F (36.6 C)  SpO2: 96% 95%  Elevated BP- permissive for the next 1 wk   10. CKD stage II. Follow-up chemistries BMET with mild elevation of BUN and creat over baseline, enc fluids, I 1182ml yesterday, improved, will monitor  11. Hypertension. Permissive hypertension. Patient on Norvasc 10 mg daily, Coreg 25 mg twice a day,hygroton 25 mg daily, hydralazine 50 mg 3 times a day, lisinopril 40 mg daily prior to  admission. Resume as needed 12. Hyperlipidemia. Lipitor    LOS: 4 days A FACE TO FACE EVALUATION WAS PERFORMED  [name] [name] 03/15/2018, 7:54 AM

## 2018-03-15 NOTE — Progress Notes (Signed)
Occupational Therapy Session Note  Patient Details  Name: [name] MRN: [id number] Date of Birth: 06/13/1965  Today's Date: 03/15/2018 OT Individual Time: [id number] OT Individual Time Calculation (min): 73 min    Short Term Goals: Week 1:  OT Short Term Goal 1 (Week 1): Pt will perform shower transfer with min A. OT Short Term Goal 2 (Week 1): Pt will perform clothing management in standing with min A for standing balance.  OT Short Term Goal 3 (Week 1): Pt will perform toilet transfer with min A.  Skilled Therapeutic Interventions/Progress Updates:    Treatment session with focus on activity tolerance, endurance, and functional use of LUE.  Pt received upright in w/c expressing "tired of sitting".  Engaged in standing activity at high-low table with focus on increased activity tolerance, attention to Lt, functional use of LUE, and attention to task in moderately distracting environment.  Pt engaged in checkers in standing, frequently distracted by others in the room, but able to come back to task without cues.  Engaged in puzzle activity in sitting with focus on increased use of LUE with fine and gross motor movements while challenging problem solving to complete puzzle.  Engaged in mobility in hospital atrium and [employer] with focus on attention to task during ambulation with RW Therapist provided min guard during ambulation while providing min cues to attend to task and not get distracted by others.  Pt returned to room and transferred to recliner with RW and close supervision.  Pt left upright in recliner with seat belt alarm on and husband arriving.  Therapy Documentation Precautions:  Precautions Precautions: Fall Restrictions Weight Bearing Restrictions: No Pain: Pain Assessment Pain Scale: 0-10 Pain Score: 0-No pain   Therapy/Group: Individual Therapy  Rosalio LoudHOXIE, Eryn Krejci 03/15/2018, 4:00 PM

## 2018-03-15 NOTE — Progress Notes (Signed)
Social Work Patient ID: Minus BreedingNancy Capron, female   DOB: [birth year], 52 y.o.   MRN: [id number] Have faxed clinical update to [employer] will await response for continued approval on rehab.

## 2018-03-15 NOTE — Progress Notes (Signed)
Physical Therapy Session Note  Patient Details  Name: [name] MRN: [id number] Date of Birth: Oct 13, 1965  Today's Date: 03/15/2018 PT Individual Time: [id number] PT Individual Time Calculation (min): 57 min   Short Term Goals: Week 1:  PT Short Term Goal 1 (Week 1): Pt will ambulate 7850' in w/ CGA PT Short Term Goal 2 (Week 1): Pt will maintain dynamic standing balance w/ min assist PT Short Term Goal 3 (Week 1): Pt will attend to L body during functional mobility w/o cues 50% of the time  PT Short Term Goal 4 (Week 1): Pt will perform be<>chair transfers w/ CGA  Skilled Therapeutic Interventions/Progress Updates:  Pt received on EOB with NT about to assist her to the bathroom with PT stepping in to assist. Pt ambulates into bathroom with RW & min assist with cuing to negotiate bathroom threshold with safety, with pt experiencing minor LOB with min assist to correct. Pt managed clothing with min assist for standing balance and transferred on/off elevated toilet seat. Pt with incontinent void in brief then continent void (pt report) on toilet. Pt performed peri hygiene with supervision cuing to do so. Standing at sink pt performed grooming tasks (brushing teeth, combing hair, washing hands) with supervision<>CGA for standing balance. Pt retrieved socks from dresser then donned them & shoes sitting in w/c with extra time. Transported pt to gym via w/c dependent assist for time management. Pt ambulates 100 ft + 100 ft with RW & CGA with cuing for increased gait speed and attention to task as pt gets distracted by even pictures on wall. Pt performed L lateral step ups with BUE then LUE support with max cuing to attend to LLE with CGA for balance with task focusing on LLE NMR & dynamic balance. Therapist educates pt on L inattention/neglect following medical event. Pt engaged in trampoline ball toss with CGA<>min assist for standing balance with one instance of impaired initiation of task with pt  reporting "my brain and my hands aren't working together". At end of session pt left sitting in w/c in room with chair alarm donned & all needs in reach.   Therapist provides encouragement regarding pt's CLOF & progress.  Therapy Documentation Precautions:  Precautions Precautions: Fall Restrictions Weight Bearing Restrictions: No     Therapy/Group: Individual Therapy  Sandi MariscalVictoria M Finnley Lewis 03/15/2018, 8:37 AM

## 2018-03-15 NOTE — Progress Notes (Signed)
Physical Therapy Session Note  Patient Details  Name: [name] MRN: [id number] Date of Birth: 1965-12-06  Today's Date: 03/15/2018 PT Individual Time: 7829-56210945-1055 PT Individual Time Calculation (min): 70 min   Short Term Goals: Week 1:  PT Short Term Goal 1 (Week 1): Pt will ambulate 7450' in w/ CGA PT Short Term Goal 2 (Week 1): Pt will maintain dynamic standing balance w/ min assist PT Short Term Goal 3 (Week 1): Pt will attend to L body during functional mobility w/o cues 50% of the time  PT Short Term Goal 4 (Week 1): Pt will perform be<>chair transfers w/ CGA  Skilled Therapeutic Interventions/Progress Updates:   Pt in w/c and agreeable to therapy, denies pain. Pt self-propelled w/c to/from therapy gym w/ BUEs to work on coordination, sequencing, and functional problem solving, supervision overall w/ min-mod verbal cues for technique. Worked on standing balance and balance strategies this session. Performed clothespin task emphasizing L attention and trunk rotation while utilizing LUE, performed both on firm and compliant surfaces w/o UE support. Worked on hip balance strategies while holding onto large ball and weight shifting in all directions: forward, backward, L, and R x5 reps on firm and 2x5 on foam surface. Performed multiple sit<>stands w/ yellow ball in front to facilitate increased anterior weight shifting during sit<>stand transfers, 2x5 reps. Practiced w/o ball and using LEs, verbal cues for forward trunk lean, CGA. Educated pt on performing this way vs using BUEs to push up as it Improves her safety w/ sit<>stand transition. She verbalized understanding and in agreement. Worked on limits of stability training on biodex @ beginner level, scored 37%, 57%, and 59% w/ tactile and verbal cues for hip weight shifting strategies. Performed NuStep 10 min @ level 3 for LE reciprocal movement pattern, occasional verbal cues for neutral LE alignment. Returned to room and ended session in w/c,  all needs in reach.   Therapy Documentation Precautions:  Precautions Precautions: Fall Restrictions Weight Bearing Restrictions: No  Therapy/Group: Individual Therapy  Ryon Layton K Zakaree Mcclenahan 03/15/2018, 11:00 AM

## 2018-03-16 ENCOUNTER — Inpatient Hospital Stay (HOSPITAL_COMMUNITY): Payer: 59 | Admitting: Occupational Therapy

## 2018-03-16 ENCOUNTER — Inpatient Hospital Stay (HOSPITAL_COMMUNITY): Payer: 59 | Admitting: *Deleted

## 2018-03-16 ENCOUNTER — Inpatient Hospital Stay (HOSPITAL_COMMUNITY): Payer: 59 | Admitting: Physical Therapy

## 2018-03-16 ENCOUNTER — Encounter (HOSPITAL_COMMUNITY): Payer: 59 | Admitting: Psychology

## 2018-03-16 LAB — GLUCOSE, CAPILLARY
GLUCOSE-CAPILLARY: 151 mg/dL — AB (ref 70–99)
GLUCOSE-CAPILLARY: 111 mg/dL — AB (ref 70–99)
Glucose-Capillary: 123 mg/dL — ABNORMAL HIGH (ref 70–99)
Glucose-Capillary: 175 mg/dL — ABNORMAL HIGH (ref 70–99)

## 2018-03-16 MED ORDER — SENNOSIDES-DOCUSATE SODIUM 8.6-50 MG PO TABS
2.0000 | ORAL_TABLET | Freq: Two times a day (BID) | ORAL | Status: DC
  Administered 2018-03-16 – 2018-03-23 (×14): 2 via ORAL
  Filled 2018-03-16 (×15): qty 2

## 2018-03-16 NOTE — Progress Notes (Signed)
Occupational Therapy Session Note  Patient Details  Name: [name] MRN: [id number] Date of Birth: [birth year]  Today's Date: 03/16/2018 OT Individual Time: 4098-11911124-1204 OT Individual Time Calculation (min): 40 min  and Today's Date: 03/16/2018 OT Missed Time: 20 Minutes Missed Time Reason: Other (comment)(scheduling error/team conference)   Short Term Goals: Week 1:  OT Short Term Goal 1 (Week 1): Pt will perform shower transfer with min A. OT Short Term Goal 2 (Week 1): Pt will perform clothing management in standing with min A for standing balance.  OT Short Term Goal 3 (Week 1): Pt will perform toilet transfer with min A.  Skilled Therapeutic Interventions/Progress Updates:    Treatment session with focus on functional mobility, safety awareness, and attention to LUE/LLE.  Pt requesting to shower this session.  Pt required mod assist sit > stand due to unsafely pulling up on RW while lower legs crossed at ankle.  Once upright, pt stating "I could feel my feet all tangled up".  Educated pt on increased awareness of body especially during transitional movements.  Pt ambulated around room with RW to gather clothing prior to bathing at shower level.  Therapist provided contact guard throughout mobility.  Bathing completed at sit > stand level with contact guard when standing to wash buttocks.  Pt able to complete all dressing with contact guard when standing to pull pants over hips.  Pt ambulated back to w/c with RW and completed grooming tasks in sitting.  Pt left upright in w/c with seat belt alarm on and all needs in reach.  Therapy Documentation Precautions:  Precautions Precautions: Fall Restrictions Weight Bearing Restrictions: No General: General OT Amount of Missed Time: 20 Minutes Pain:   Pt with no c/o pain  Therapy/Group: Individual Therapy  Rosalio LoudHOXIE, Minami Arriaga 03/16/2018, 12:25 PM

## 2018-03-16 NOTE — Progress Notes (Signed)
Occupational Therapy Session Note  Patient Details  Name: [name] MRN: [id number] Date of Birth: March 02, 1966  Today's Date: 03/16/2018 OT Individual Time: 4098-11911402-1445 OT Individual Time Calculation (min): 43 min    Short Term Goals: Week 1:  OT Short Term Goal 1 (Week 1): Pt will perform shower transfer with min A. OT Short Term Goal 2 (Week 1): Pt will perform clothing management in standing with min A for standing balance.  OT Short Term Goal 3 (Week 1): Pt will perform toilet transfer with min A.  Skilled Therapeutic Interventions/Progress Updates:   Treatment session with focus on functional mobility and safety awareness during therapeutic activities.  Pt received supine in bed reporting fatigue but willing to engage in therapy session.  Pt expressed concerns regarding organization, processing, and sequencing of functional tasks.  Pt ambulated to Dayroom with RW with contact guard and increased safety awareness when turning to sit in chair.  Engaged in cognitive processing tasks with focus on sequencing of familiar, functional tasks.  Pt demonstrating good problem solving when planning out tasks to increase organization skills.  Therapist encouraged list making to improve thought processing and to increase memory/recall.  Pt reports pleased with outcome of activity.  Returned to room as above and reports need to toilet.  Completed toilet transfer and toileting tasks with close supervision.  Pt returned to bed and left in sidelying with all needs in reach.  Therapy Documentation Precautions:  Precautions Precautions: Fall Restrictions Weight Bearing Restrictions: No Vital Signs: Therapy Vitals Pulse Rate: 90 Resp: 14 BP: (!) 169/94 Patient Position (if appropriate): Sitting Oxygen Therapy SpO2: 97 % O2 Device: Room Air Pain:  Pt with no c/o pain   Therapy/Group: Individual Therapy  Rosalio LoudHOXIE, [name] 03/16/2018, 3:39 PM

## 2018-03-16 NOTE — Progress Notes (Addendum)
Physical Therapy Session Note  Patient Details  Name: [name] MRN: [id number] Date of Birth: 01/11/1966  Today's Date: 03/16/2018 PT Individual Time: 8657-8469 and 1335-1400 and 6295-2841 PT Individual Time Calculation (min): 55 min and 25 min and 30 min   Short Term Goals: Week 1:  PT Short Term Goal 1 (Week 1): Pt will ambulate 27' in w/ CGA PT Short Term Goal 2 (Week 1): Pt will maintain dynamic standing balance w/ min assist PT Short Term Goal 3 (Week 1): Pt will attend to L body during functional mobility w/o cues 50% of the time  PT Short Term Goal 4 (Week 1): Pt will perform be<>chair transfers w/ CGA  Skilled Therapeutic Interventions/Progress Updates:  Treatment 1: Pt received in w/c & agreeable to tx, denying c/o pain. Pt donned clean socks & shoes with set up assist and extra time while sitting in w/c; pt requires cuing to attend to task and complete donning footwear on R foot after L. Pt ambulates room<>gym with RW & close supervision, but requiring min assist on one occasion 2/2 LOB with pt demonstrating significantly decreased gait speed & distraction despite MAX cuing for attention to task and increased gait speed. In gym pt ambulates 80 ft without AD and min assist with task focusing on dynamic balance, L NMR, and gait. While sitting EOM pt performed sit<>stands with RLE elevated on 6" step to force LLE use for strengthening and NMR with therapist providing CGA increasing to min assist as pt fatigues for sit>stand and min assist for standing balance 2/2 posterior lean and cuing for full upright posture and hip extension. Pt transferred to supine on mat table and performed BLE bridging with adductor hold and LLE single leg bridging with cuing for technique and pelvic control with this improving as task progressed. Pt transferred to tall kneeling on mat table and maintained position without UE support with close supervision while engaging in small pipe tree assembly with task  focusing on forced use of LUE for attention to extremity; pt was able to assemble 2 simple shapes with occasional cuing. At end of session pt left sitting in w/c in room with chair alarm donned, needs in reach, & RN in room.  Treatment 2: Pt seen to make up missed time from earlier today. Pt received in bed & agreeable to tx. Pt transferred to EOB with supervision and donned tennis shoes with set up assist and extra time. Pt ambulates to/from gym with RW & supervision with improving ability to increase gait speed with max verbal cuing from therapist. Pt transferred mat<>floor with supervision for floor transfer. While on floor pt transferred to quadruped and engaged in lifting 1 UE (either LE or UE) with CGA for balance with task focusing on core strengthening and NMR. Progressed to bird dog exercises with CGA<>light min assist for core stability from therapist and max multimodal cuing for technique for activity. At end of session pt left in bed with alarm set & call bell in reach. During session pt demonstrates impaired recall & awareness, requiring cuing to push up from stable surface for sit>stand transfers, and with assistance problem solving turning towards EOM while remaining in center of RW. No c/o pain reported during session.   Treatment 3: Pt received in bed & agreeable to tx, reporting 5/10 HA & nursing staff made aware. Pt transferred to EOB with supervision and donned tennis shoes with extra time. Pt ambulates room<>gym with RW and supervision with onging cuing for sustained attention to  task as pt very internally distracted & distracted by environment. At rail in hallway pt performed side stepping with BUE support, min assist and max cuing for technique with task focusing on LLE strengthening, coordination & NMR. BP after task = 169/94 mmHg (RUE, sitting), HR = 90 bpm. Pt performed mini squats at rail with multimodal cuing for technique with task focusing on LE strengthening. At end of session pt  left sitting in w/c with chair alarm donned & all needs in reach.   Therapy Documentation Precautions:  Precautions Precautions: Fall Restrictions Weight Bearing Restrictions: No    Therapy/Group: Individual Therapy  Sandi MariscalVictoria M Jeanet Lupe 03/16/2018, 3:47 PM

## 2018-03-16 NOTE — Patient Care Conference (Signed)
Inpatient RehabilitationTeam Conference and Plan of Care Update Date: 03/16/2018   Time: 10:50 Am    Patient Name: [name]      Medical Record Number: [id number]  Date of Birth: 31-Aug-1965 Sex: Female         Room/Bed: 4W20C/4W20C-01 Payor Info: Payor: [employer] Falling / Plan: [employer] NAP / Product Type: *No Product type* /    Admitting Diagnosis: CVA  Admit Date/Time:  03/11/2018  1:35 PM Admission Comments: No comment available   Primary Diagnosis:  <principal problem not specified> Principal Problem: <principal problem not specified>  Patient Active Problem List   Diagnosis Date Noted  . CKD (chronic kidney disease) stage 2, GFR 60-89 ml/min 03/12/2018  . OSA on CPAP 03/12/2018  . 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 Seizures ([place]) 03/08/2018  . Non compliance w medication regimen   . Dyslipidemia 03/03/2018  . Acute CVA (cerebrovascular accident) ([place]) 03/02/2018  . Type 2 diabetes mellitus ([place]) 03/25/2015  . Hypothyroidism 03/25/2015  . HTN (hypertension) 03/25/2015  . RLS (restless legs syndrome) 03/25/2015  . History of stroke 11/30/2014  . Obesity, morbid, BMI 40.0-49.9 ([place]) 11/30/2014  . Pure hypertriglyceridemia 11/30/2014    Expected Discharge Date: Expected Discharge Date: 03/22/18  Team Members Present: Physician leading conference: Dr. Alysia Penna Social Worker Present: Ovidio Kin, LCSW Nurse Present: Heather Roberts, RN PT Present: Lavone Nian, PT OT Present: Simonne Come, OT SLP Present: Weston Anna, SLP PPS Coordinator present : Daiva Nakayama, RN, CRRN     Current Status/Progress Goal Weekly Team Focus  Medical   diabetes control improving , permissive BP  maintain med stability reduce readmission risk, reduce fall risk  adjust DM management this week, start with increase BP management next wk   Bowel/Bladder   incontinent x2 LBM 03/13/2018  Continent of bowel and bladder with minimal assistance  Assess bowel and bladder needs qshift  and PRN offer toilting q2hrs while awake   Swallow/Nutrition/ Hydration             ADL's   min guard bathing/dressing, transfers with RW  supervision  dynamic standing balance, endurance, Lt attention, functional use of LUE, pt/family education   Mobility   min assist gait with RW, very distracted, impaired attention to task, L neglect/inattention  supervision gait with LRAD, CGA stair negotiation, mod I bed mobility  gait, endurance, L NMR, transfers, stair negotiation, pt education, safety awareness, balance   Communication             Safety/Cognition/ Behavioral Observations  Supervision  Supervision  Goal Met, D/C from SLP caseload    Pain   No c/o pain  continue to be pain free  Assess pain qshift and PRN   Skin   no skin impairments  remain free of skin impairments  assess skin qshift and PRN      *See Care Plan and progress notes for long and short-term goals.     Barriers to Discharge  Current Status/Progress Possible Resolutions Date Resolved   Physician    Medical stability     progreesing toward goals  see above      Nursing                  PT  Decreased caregiver support;Lack of/limited family support  husband works during the day - recommending supervision at d/c              OT Decreased caregiver support  SLP                SW                Discharge Planning/Teaching Needs:  Home with husband and grwon children together can almost provide 24 hr care. Making progress and beng seen by neuro-psych for coping      Team Discussion:  Goals supervision level due to safety and attention issues. Moves very slowly and flat affect. Neuro-psych to see today. Easily distracted which makes her unsafe. BP up and down-MD addressing. Nursing working on urinary continence. Will need family education prior to discharge home.   Revisions to Treatment Plan:  DC 12/3    Continued Need for Acute Rehabilitation Level of Care: The patient requires daily  medical management by a physician with specialized training in physical medicine and rehabilitation for the following conditions: Daily direction of a multidisciplinary physical rehabilitation program to ensure safe treatment while eliciting the highest outcome that is of practical value to the patient.: Yes Daily medical management of patient stability for increased activity during participation in an intensive rehabilitation regime.: Yes Daily analysis of laboratory values and/or radiology reports with any subsequent need for medication adjustment of medical intervention for : Neurological problems;Blood pressure problems;Diabetes problems   I attest that I was present, lead the team conference, and concur with the assessment and plan of the team.   Elease Hashimoto 03/16/2018, 11:37 AM

## 2018-03-16 NOTE — Progress Notes (Signed)
Physical Therapy Weekly Progress Note  Patient Details  Name: [name] MRN: [id number] Date of Birth: [birth year]  Beginning of progress report period: March 12, 2018 End of progress report period: March 16, 2018  Today's Date: 03/16/2018   Patient has met 4 of 4 short term goals.  Pt is making good progress towards all LTG's as she has progressed to supervision level with RW on this date. Pt continues to be limited by significant internal distraction, impaired awareness, and impaired problem solving. Pt would benefit from continued skilled PT treatment to focus on standing balance, L NMR, attention to L side of body, and for pt/family education and caregiver training prior to d/c. Pt requires 24 hr supervision upon d/c.  Patient continues to demonstrate the following deficits muscle weakness, decreased cardiorespiratoy endurance, decreased coordination, decreased attention to left, decreased initiation, decreased attention, decreased awareness, decreased problem solving, decreased safety awareness, decreased memory and delayed processing, and decreased standing balance, decreased postural control, hemiplegia and decreased balance strategies and therefore will continue to benefit from skilled PT intervention to increase functional independence with mobility.  Patient progressing toward long term goals..  Continue plan of care.  PT Short Term Goals Week 1:  PT Short Term Goal 1 (Week 1): Pt will ambulate 6' in w/ CGA PT Short Term Goal 1 - Progress (Week 1): Met PT Short Term Goal 2 (Week 1): Pt will maintain dynamic standing balance w/ min assist PT Short Term Goal 2 - Progress (Week 1): Met PT Short Term Goal 3 (Week 1): Pt will attend to L body during functional mobility w/o cues 50% of the time  PT Short Term Goal 3 - Progress (Week 1): Met PT Short Term Goal 4 (Week 1): Pt will perform bed<>chair transfers w/ CGA PT Short Term Goal 4 - Progress (Week 1): Met Week 2:  PT Short  Term Goal 1 (Week 2): STG = LTG due to estimated d/c date.   Therapy Documentation Precautions:  Precautions Precautions: Fall Restrictions Weight Bearing Restrictions: No   Waunita Schooner 03/16/2018, 2:48 PM

## 2018-03-16 NOTE — Progress Notes (Signed)
Social Work Patient ID: [name], female   DOB: Jun 26, 1965, 52 y.o.   MRN: [id number] Met with pt to discuss team conference goals supervision level and target discharge date 12/3. She is pleased with this plan and reports her family can come in over the weekend to go through therapies with her. This works out better for the, due to their busy schedules. Already has [facility] seeing her will resume their services and see if any more equipment is needed.

## 2018-03-16 NOTE — Evaluation (Signed)
Recreational Therapy Assessment and Plan  Patient Details  Name: [name] MRN: [id number] Date of Birth: 25-Mar-1966 Today's Date: 03/16/2018  Rehab Potential:   ELOS: discharge 12/3   Assessment Problem List:      Patient Active Problem List   Diagnosis Date Noted  . 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 Seizures ([place]) 03/08/2018  . Non compliance w medication regimen   . Dyslipidemia 03/03/2018  . Acute CVA (cerebrovascular accident) ([place]) 03/02/2018  . Type 2 diabetes mellitus ([place]) 03/25/2015  . Hypothyroidism 03/25/2015  . HTN (hypertension) 03/25/2015  . RLS (restless legs syndrome) 03/25/2015  . History of stroke 11/30/2014  . Obesity, morbid, BMI 40.0-49.9 ([place]) 11/30/2014  . Pure hypertriglyceridemia 11/30/2014    Past Medical History:      Past Medical History:  Diagnosis Date  . Anxiety   . Chronic kidney disease   . Diabetes mellitus without complication ([place])   . Fibromyalgia   . Headache   . History of kidney stones   . Hyperlipemia   . Hypertension   . Hypothyroidism   . Insomnia   . Pancreatitis    chronic  . Sleep apnea   . Stroke (cerebrum) ([place])   . Thrombotic cerebral infarction [facility]) 03/11/2018   Past Surgical History:       Past Surgical History:  Procedure Laterality Date  . ABDOMINAL HYSTERECTOMY    . CHOLECYSTECTOMY    . ROTATOR CUFF REPAIR Right   . SHOULDER SURGERY Left     Assessment & Plan Clinical Impression: Patient is a 52 year old right-handed female with history of hypertension, OSA with CPAP, diabetes mellitus, hyperlipidemia, CKD stage II, fibromyalgia, previous CVA 2014 with residual left-sided weakness and noncompliant with medications as well as recent admission 03/02/2018 to 03/04/2018 for increasing left side weakness as well as reported new onset seizure and was started on Keppra.[facility] MRI at that time showed right posterior ACA territory infarctions and maintain on aspirin and  Plavix.EEG was negative for seizure. She was discharged to home with home health services after declining skilled nursing facility ambulating minimal assistance 80 feet with a rolling walker. She lives with her husband who works during the day. Patient presented [facility] 03/08/2018 with increasing left lower extremity weakness and blurred vision. Cranial CT scan completed in the ED showing subacute infarction right ACA territory remote right occipital and inferior cerebellar infarct without intracranial hemorrhage. MRI showed slight interval expansion worsening of the right ACA territory infarction as compared to previous studies. 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 Neurology follow-up currently planned is to continue aspirin and Plavix therapy and follow-up 3 months with interventional radiology for repeat arteriogram. Therapy evaluations completed with recommendations of physical medicine rehabilitation consult. Patient transferred to [facility] on 03/11/2018 .   Met with pt today to discuss leisure interests, activity analysis with potential modifications, and relaxation techniques.  Pt states multiple leisure interests that she feels she can participate in once home.  Dicussed the importance of staying active in engaged in leisure tasks for physical, emotional, social & spiritual health.  Pt stated agreement and understanding.  No further TR as pt is expected to discharge on 12/3.  Leisure History/Participation Premorbid leisure interest/current participation: Personnel officer -  Painting;Crafts - Other (Comment);Community - Grocery store;Community - Other  (Comment) Expression Interests: Music (Comment);Dance(line dancing) Other Leisure Interests: Television;Movies Leisure Participation Style: Alone;With Family/Friends Awareness of Community Resources: Excellent Psychosocial / Spiritual Patient agreeable to Pet Therapy: Yes Does patient have pets?: Yes Social interaction - Mood/Behavior: Cooperative Strengths/Weaknesses Patient weaknesses: Physical limitations TR Patient demonstrates impairments in the following area(s): Endurance;Motor;Safety  Plan Rec Therapy Plan Treatment times per week: No further TR as pt is expected to discharge 12/3 Estimated Length of Stay: discharge 12/3 Recommendations for other services: Neuropsych  Recommendations for other services: Neuropsych  Discharge Criteria: Patient will be discharged from TR if patient refuses treatment 3 consecutive times without medical reason.  If treatment goals not met, if there is a change in medical status, if patient makes no progress towards goals or if patient is discharged from hospital.  The above assessment, treatment plan, treatment alternatives and goals were discussed and mutually agreed upon: by patient  [name] 03/16/2018, 2:04 PM

## 2018-03-16 NOTE — Progress Notes (Addendum)
[place] PHYSICAL MEDICINE & REHABILITATION PROGRESS NOTE   Subjective/Complaints:  Constipation per nsg, weekly BMs at home per pt  ROS- denies CP, SOB, N/V/D  Objective:   No results found. Recent Labs    03/14/18 0657  WBC 9.4  HGB 12.7  HCT 40.2  PLT 324   Recent Labs    03/14/18 0657  NA 139  K 3.8  CL 106  CO2 27  GLUCOSE 180*  BUN 24*  CREATININE 1.70*  CALCIUM 8.7*    Intake/Output Summary (Last 24 hours) at 03/16/2018 0848 Last data filed at 03/16/2018 0846 Gross per 24 hour  Intake 762 ml  Output -  Net 762 ml     Physical Exam: Vital Signs Blood pressure (!) 183/107, pulse 78, temperature 97.9 F (36.6 C), resp. rate 14, height 5' 5"  (1.651 m), weight 102.4 kg, SpO2 95 %.  General: No acute distress Mood and affect are appropriate Heart: Regular rate and rhythm no rubs murmurs or extra sounds Lungs: Clear to auscultation, breathing unlabored, no rales or wheezes Abdomen: Positive bowel sounds, soft nontender to palpation, nondistended Extremities: No clubbing, cyanosis, or edema Skin: No evidence of breakdown, no evidence of rash Neurologic: Cranial nerves II through XII intact, motor strength is 5/5 in right and 4/5 left deltoid, bicep, tricep, grip, hip flexor, knee extensors, ankle dorsiflexor and plantar flexor Sensory exam normal sensation to light touch and proprioception in bilateral upper and lower extremities Cerebellar exam normal finger to nose to finger as well as heel to shin in bilateral upper and lower extremities Musculoskeletal: Full range of motion in all 4 extremities. No joint swelling    Assessment/Plan: 1. 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  which require 3+ hours per day of interdisciplinary therapy in a comprehensive inpatient rehab setting.  Physiatrist is providing close team supervision and 24 hour management of active medical problems listed below.  Physiatrist and rehab team continue to assess  barriers to discharge/monitor patient progress toward functional and medical goals  Care Tool:  Bathing  Bathing activity did not occur: Refused Body parts bathed by patient: Right arm, Left arm, Chest, Abdomen, Front perineal area, Right upper leg, Left upper leg, Face   Body parts bathed by helper: Right lower leg, Left lower leg, Buttocks     Bathing assist Assist Level: Minimal Assistance - Patient > 75%     Upper Body Dressing/Undressing Upper body dressing   What is the patient wearing?: Bra, Pull over shirt    Upper body assist Assist Level: Supervision/Verbal cueing    Lower Body Dressing/Undressing Lower body dressing      What is the patient wearing?: Pants, Incontinence brief     Lower body assist Assist for lower body dressing: Moderate Assistance - Patient 50 - 74%     Toileting Toileting    Toileting assist Assist for toileting: Minimal Assistance - Patient > 75%     Transfers Chair/bed transfer  Transfers assist  Chair/bed transfer activity did not occur: Safety/medical concerns  Chair/bed transfer assist level: Minimal Assistance - Patient > 75%     Locomotion Ambulation   Ambulation assist      Assist level: Contact Guard/Touching assist Assistive device: Walker-rolling Max distance: 100 ft   Walk 10 feet activity   Assist     Assist level: Contact Guard/Touching assist Assistive device: Walker-rolling   Walk 50 feet activity   Assist    Assist level: Contact Guard/Touching assist Assistive device: Walker-rolling  Walk 150 feet activity   Assist Walk 150 feet activity did not occur: Safety/medical concerns         Walk 10 feet on uneven surface  activity   Assist Walk 10 feet on uneven surfaces activity did not occur: Safety/medical concerns         Wheelchair     Assist Will patient use wheelchair at discharge?: (tbd) Type of Wheelchair: Manual    Wheelchair assist level: Supervision/Verbal  cueing Max wheelchair distance: 150'    Wheelchair 50 feet with 2 turns activity    Assist        Assist Level: Supervision/Verbal cueing   Wheelchair 150 feet activity     Assist Wheelchair 150 feet activity did not occur: Safety/medical concerns   Assist Level: Supervision/Verbal cueing    Medical Problem List and Plan: 1.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 Formulized individual treatment plan based on medical history, underlying problem and comorbidities. 2. DVT Prophylaxis/Anticoagulation: SCDs. 3. Pain Management:Cymbalta 60 mg daily 4. Mood:Seroquel 50 mg daily at bedtime 5. Neuropsych: This patientiscapable of making decisions on herown behalf. 6. Skin/Wound Care:Routine skin checks 7. Fluids/Electrolytes/Nutrition:Routine and out's with follow-up chemistries 8. Seizure disorder. Keppra 500 mg twice a day 9.Diabetes mellitus with peripheral neuropathy. Hemoglobin A1c 12.9.Lantus insulin 30 units daily at bedtime. Check blood sugars before meals and at bedtime. Diabetic teaching CBG (last 3)  Recent Labs    03/15/18 1646 03/15/18 2159 03/16/18 0702  GLUCAP 156* 188* 123*  CBGs adequately controlled in hospital for now Vitals:   03/15/18 2037 03/16/18 0432  BP:  (!) 183/107  Pulse:  78  Resp:  14  Temp: 98.2 F (36.8 C) 97.9 F (36.6 C)  SpO2:  95%  Elevated BP- permissive for the next 1 wk   10. CKD stage II. Follow-up chemistries BMET with mild elevation of BUN and creat over baseline, enc fluids, I 766m yesterday, improved, will monitor  11. Hypertension. Permissive hypertension.  Patient on Norvasc 10 mg daily, Coreg 25 mg twice a day,hygroton 25 mg daily, hydralazine 50 mg 3 times a day, lisinopril 40 mg daily prior to admission. Resume as needed 12. Hyperlipidemia. Lipitor  13.  Chronic constipation increase senna S to 2 po BID  LOS: 5 days A FACE TO FACE EVALUATION WAS PERFORMED  ACharlett Blake11/27/2019, 8:48 AM

## 2018-03-17 LAB — GLUCOSE, CAPILLARY
GLUCOSE-CAPILLARY: 175 mg/dL — AB (ref 70–99)
GLUCOSE-CAPILLARY: 116 mg/dL — AB (ref 70–99)
Glucose-Capillary: 160 mg/dL — ABNORMAL HIGH (ref 70–99)
Glucose-Capillary: 161 mg/dL — ABNORMAL HIGH (ref 70–99)

## 2018-03-17 MED ORDER — AMLODIPINE BESYLATE 5 MG PO TABS
5.0000 mg | ORAL_TABLET | Freq: Every day | ORAL | Status: DC
  Administered 2018-03-17 – 2018-03-21 (×5): 5 mg via ORAL
  Filled 2018-03-17 (×4): qty 1

## 2018-03-17 NOTE — Plan of Care (Signed)
  Problem: RH BOWEL ELIMINATION Goal: RH STG MANAGE BOWEL WITH ASSISTANCE Description STG Manage Bowel with min. Assistance.  Outcome: Progressing Goal: RH STG MANAGE BOWEL W/MEDICATION W/ASSISTANCE Description STG Manage Bowel with Medication with min. Assistance.  Outcome: Progressing   Problem: RH BLADDER ELIMINATION Goal: RH STG MANAGE BLADDER WITH ASSISTANCE Description STG Manage Bladder With min.Assistance  Outcome: Progressing   Problem: RH SKIN INTEGRITY Goal: RH STG SKIN FREE OF INFECTION/BREAKDOWN Description With min. assist.  Outcome: Progressing Goal: RH STG MAINTAIN SKIN INTEGRITY WITH ASSISTANCE Description STG Maintain Skin Integrity With min.Assistance.  Outcome: Progressing   Problem: RH SAFETY Goal: RH STG ADHERE TO SAFETY PRECAUTIONS W/ASSISTANCE/DEVICE Description STG Adhere to Safety Precautions With min. Assistance/Device.  Outcome: Progressing   Problem: RH PAIN MANAGEMENT Goal: RH STG PAIN MANAGED AT OR BELOW PT'S PAIN GOAL Description Less than 3.  Outcome: Progressing   Problem: RH KNOWLEDGE DEFICIT Goal: RH STG INCREASE KNOWLEDGE OF DIABETES Outcome: Progressing Goal: RH STG INCREASE KNOWLEDGE OF HYPERTENSION Outcome: Progressing Goal: RH STG INCREASE KNOWLEGDE OF HYPERLIPIDEMIA Outcome: Progressing Goal: RH STG INCREASE KNOWLEDGE OF STROKE PROPHYLAXIS Outcome: Progressing   Problem: Consults Goal: RH STROKE PATIENT EDUCATION Description See Patient Education module for education specifics  Outcome: Progressing

## 2018-03-17 NOTE — Progress Notes (Signed)
[place] PHYSICAL MEDICINE & REHABILITATION PROGRESS NOTE   Subjective/Complaints:  Discussed d/c date, husband will be in today, ordering therapeutic grounds pass  ROS- denies CP, SOB, N/V/D  Objective:   No results found. No results for input(s): WBC, HGB, HCT, PLT in the last 72 hours. No results for input(s): NA, K, CL, CO2, GLUCOSE, BUN, CREATININE, CALCIUM in the last 72 hours.  Intake/Output Summary (Last 24 hours) at 03/17/2018 0956 Last data filed at 03/17/2018 0752 Gross per 24 hour  Intake 800 ml  Output -  Net 800 ml     Physical Exam: Vital Signs Blood pressure (!) 159/90, pulse 85, temperature 98.5 F (36.9 C), temperature source Oral, resp. rate 16, height 5\' 5"  (1.651 m), weight 102.4 kg, SpO2 94 %.  General: No acute distress Mood and affect are appropriate Heart: Regular rate and rhythm no rubs murmurs or extra sounds Lungs: Clear to auscultation, breathing unlabored, no rales or wheezes Abdomen: Positive bowel sounds, soft nontender to palpation, nondistended Extremities: No clubbing, cyanosis, or edema Skin: No evidence of breakdown, no evidence of rash Neurologic: Cranial nerves II through XII intact, motor strength is 5/5 in right and 4/5 left deltoid, bicep, tricep, grip, hip flexor, knee extensors, ankle dorsiflexor and plantar flexor Sensory exam normal sensation to light touch and proprioception in bilateral upper and lower extremities Cerebellar exam normal finger to nose to finger as well as heel to shin in bilateral upper and lower extremities Musculoskeletal: Full range of motion in all 4 extremities. No joint swelling    Assessment/Plan: 1. 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  which require 3+ hours per day of interdisciplinary therapy in a comprehensive inpatient rehab setting.  Physiatrist is providing close team supervision and 24 hour management of active medical problems listed below.  Physiatrist and rehab team  continue to assess barriers to discharge/monitor patient progress toward functional and medical goals  Care Tool:  Bathing  Bathing activity did not occur: Refused Body parts bathed by patient: Right arm, Left arm, Chest, Abdomen, Front perineal area, Right upper leg, Left upper leg, Face   Body parts bathed by helper: Right lower leg, Left lower leg, Buttocks     Bathing assist Assist Level: Minimal Assistance - Patient > 75%     Upper Body Dressing/Undressing Upper body dressing   What is the patient wearing?: Bra, Pull over shirt    Upper body assist Assist Level: Supervision/Verbal cueing    Lower Body Dressing/Undressing Lower body dressing      What is the patient wearing?: Pants, Incontinence brief     Lower body assist Assist for lower body dressing: Moderate Assistance - Patient 50 - 74%     Toileting Toileting    Toileting assist Assist for toileting: Minimal Assistance - Patient > 75%     Transfers Chair/bed transfer  Transfers assist  Chair/bed transfer activity did not occur: Safety/medical concerns  Chair/bed transfer assist level: Minimal Assistance - Patient > 75%     Locomotion Ambulation   Ambulation assist      Assist level: Supervision/Verbal cueing Assistive device: Walker-rolling Max distance: 125 ft   Walk 10 feet activity   Assist     Assist level: Supervision/Verbal cueing Assistive device: Walker-rolling   Walk 50 feet activity   Assist    Assist level: Supervision/Verbal cueing Assistive device: Walker-rolling    Walk 150 feet activity   Assist Walk 150 feet activity did not occur: Safety/medical concerns  Walk 10 feet on uneven surface  activity   Assist Walk 10 feet on uneven surfaces activity did not occur: Safety/medical concerns         Wheelchair     Assist Will patient use wheelchair at discharge?: (tbd) Type of Wheelchair: Manual    Wheelchair assist level:  Supervision/Verbal cueing Max wheelchair distance: 150'    Wheelchair 50 feet with 2 turns activity    Assist        Assist Level: Supervision/Verbal cueing   Wheelchair 150 feet activity     Assist Wheelchair 150 feet activity did not occur: Safety/medical concerns   Assist Level: Supervision/Verbal cueing    Medical Problem List and Plan: 1.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 DVT Prophylaxis/Anticoagulation: SCDs. 3. Pain Management:Cymbalta 60 mg daily 4. Mood:Seroquel 50 mg daily at bedtime 5. Neuropsych: This patientiscapable of making decisions on herown behalf. 6. Skin/Wound Care:Routine skin checks 7. Fluids/Electrolytes/Nutrition:Routine and out's with follow-up chemistries 8. Seizure disorder. Keppra 500 mg twice a day 9.Diabetes mellitus with peripheral neuropathy. Hemoglobin A1c 12.9.Lantus insulin 30 units daily at bedtime. Check blood sugars before meals and at bedtime. Diabetic teaching CBG (last 3)  Recent Labs    03/16/18 1624 03/16/18 2101 03/17/18 0633  GLUCAP 111* 175* 160*  Controlled 11/28 Vitals:   03/16/18 1940 03/17/18 0517  BP: (!) 176/95 (!) 159/90  Pulse: 82 85  Resp: 18 16  Temp: 98 F (36.7 C) 98.5 F (36.9 C)  SpO2: 96% 94%  Elevated BP- will start to regulate more tightly given D/C in <1wk    10. CKD stage II. Follow-up chemistries BMET with mild elevation of BUN and creat over baseline, enc fluids, I yesterday, improved, will monitor  11. Hypertension. Permissive hypertension. Patient on Norvasc 10 mg daily, Coreg 25 mg twice a day,hygroton 25 mg daily, hydralazine 50 mg 3 times a day, lisinopril 40 mg daily prior to admission. Resume as  needed 12. Hyperlipidemia. Lipitor  13.  Chronic constipation increase senna S to 2 po BID BM this am LOS: 6 days A FACE TO FACE EVALUATION WAS PERFORMED  Erick Colace 03/17/2018, 9:56 AM

## 2018-03-18 ENCOUNTER — Inpatient Hospital Stay (HOSPITAL_COMMUNITY): Payer: 59 | Admitting: Physical Therapy

## 2018-03-18 ENCOUNTER — Inpatient Hospital Stay (HOSPITAL_COMMUNITY): Payer: 59 | Admitting: Occupational Therapy

## 2018-03-18 LAB — GLUCOSE, CAPILLARY
Glucose-Capillary: 106 mg/dL — ABNORMAL HIGH (ref 70–99)
Glucose-Capillary: 150 mg/dL — ABNORMAL HIGH (ref 70–99)
Glucose-Capillary: 111 mg/dL — ABNORMAL HIGH (ref 70–99)
Glucose-Capillary: 174 mg/dL — ABNORMAL HIGH (ref 70–99)

## 2018-03-18 NOTE — Progress Notes (Signed)
[facility] PHYSICAL MEDICINE & REHABILITATION PROGRESS NOTE   Subjective/Complaints:  Patient practice car transfers with physical therapy today.  She states that it went fairly well.  She has no pain complaints today.  ROS- denies CP, SOB, N/V/D  Objective:   No results found. No results for input(s): WBC, HGB, HCT, PLT in the last 72 hours. No results for input(s): NA, K, CL, CO2, GLUCOSE, BUN, CREATININE, CALCIUM in the last 72 hours.  Intake/Output Summary (Last 24 hours) at 03/18/2018 0940 Last data filed at 03/18/2018 0841 Gross per 24 hour  Intake 666 ml  Output -  Net 666 ml     Physical Exam: Vital Signs Blood pressure (!) 180/90, pulse 88, temperature 98.3 F (36.8 C), temperature source Oral, resp. rate 20, height 5\' 5"  (1.651 m), weight 102.4 kg, SpO2 99 %.  General: No acute distress Mood and affect are appropriate Heart: Regular rate and rhythm no rubs murmurs or extra sounds Lungs: Clear to auscultation, breathing unlabored, no rales or wheezes Abdomen: Positive bowel sounds, soft nontender to palpation, nondistended Extremities: No clubbing, cyanosis, or edema Skin: No evidence of breakdown, no evidence of rash Neurologic: Cranial nerves II through XII intact, motor strength is 5/5 in right and 4/5 left deltoid, bicep, tricep, grip, hip flexor, knee extensors, ankle dorsiflexor and plantar flexor Sensory exam normal sensation to light touch and proprioception in bilateral upper and lower extremities Cerebellar exam normal finger to nose to finger as well as heel to shin in bilateral upper and lower extremities Musculoskeletal: Full range of motion in all 4 extremities. No joint swelling    Assessment/Plan: 1. 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  which require 3+ hours per day of interdisciplinary therapy in a comprehensive inpatient rehab setting.  Physiatrist is providing close team supervision and 24 hour management of active medical  problems listed below.  Physiatrist and rehab team continue to assess barriers to discharge/monitor patient progress toward functional and medical goals  Care Tool:  Bathing  Bathing activity did not occur: Refused Body parts bathed by patient: Right arm, Left arm, Chest, Abdomen, Front perineal area, Right upper leg, Left upper leg, Face, Buttocks, Right lower leg, Left lower leg   Body parts bathed by helper: Right lower leg, Left lower leg, Buttocks     Bathing assist Assist Level: Supervision/Verbal cueing     Upper Body Dressing/Undressing Upper body dressing   What is the patient wearing?: Bra, Pull over shirt    Upper body assist Assist Level: Supervision/Verbal cueing    Lower Body Dressing/Undressing Lower body dressing      What is the patient wearing?: Pants, Incontinence brief     Lower body assist Assist for lower body dressing: Minimal Assistance - Patient > 75%     Toileting Toileting    Toileting assist Assist for toileting: Contact Guard/Touching assist     Transfers Chair/bed transfer  Transfers assist  Chair/bed transfer activity did not occur: Safety/medical concerns  Chair/bed transfer assist level: Supervision/Verbal cueing     Locomotion Ambulation   Ambulation assist      Assist level: Supervision/Verbal cueing Assistive device: Walker-rolling Max distance: 125 ft   Walk 10 feet activity   Assist     Assist level: Supervision/Verbal cueing Assistive device: Walker-rolling   Walk 50 feet activity   Assist    Assist level: Supervision/Verbal cueing Assistive device: Walker-rolling    Walk 150 feet activity   Assist Walk 150 feet activity did not occur: Safety/medical  concerns         Walk 10 feet on uneven surface  activity   Assist Walk 10 feet on uneven surfaces activity did not occur: Safety/medical concerns         Wheelchair     Assist Will patient use wheelchair at discharge?: (tbd) Type  of Wheelchair: Manual    Wheelchair assist level: Supervision/Verbal cueing Max wheelchair distance: 150'    Wheelchair 50 feet with 2 turns activity    Assist        Assist Level: Supervision/Verbal cueing   Wheelchair 150 feet activity     Assist Wheelchair 150 feet activity did not occur: Safety/medical concerns   Assist Level: Supervision/Verbal cueing    Medical Problem List and Plan: 1.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 DVT Prophylaxis/Anticoagulation: SCDs. 3. Pain Management:Cymbalta 60 mg daily 4. Mood:Seroquel 50 mg daily at bedtime, appears bright, she is on Seroquel chronically at home 5. Neuropsych: This patientiscapable of making decisions on herown behalf. 6. Skin/Wound Care:Routine skin checks 7. Fluids/Electrolytes/Nutrition:Routine and out's with follow-up chemistries 8. Seizure disorder. Keppra 500 mg twice a day 9.Diabetes mellitus with peripheral neuropathy. Hemoglobin A1c 12.9.Lantus insulin 30 units daily at bedtime. Check blood sugars before meals and at bedtime. Diabetic teaching CBG (last 3)  Recent Labs    03/17/18 1635 03/17/18 2058 03/18/18 0631  GLUCAP 175* 116* 106*  Controlled 11/29 Vitals:   03/17/18 1921 03/18/18 0409  BP: (!) 174/93 (!) 180/90  Pulse: 78 88  Resp: 18 20  Temp: 98.4 F (36.9 C) 98.3 F (36.8 C)  SpO2: 93% 99%  Elevated BP-continue amlodipine 5 mg/day started on 03/17/2018 10. CKD stage II. Follow-up chemistries BMET with mild elevation of BUN and creat over baseline, enc fluids, I 702ml yesterday, improved, will monitor  11. Hypertension. Permissive hypertension. Patient on Norvasc 10 mg daily, Coreg 25 mg twice a day,hygroton 25  mg daily, hydralazine 50 mg 3 times a day, lisinopril 40 mg daily prior to admission. Resume as needed 12. Hyperlipidemia. Lipitor  13.  Chronic constipation increase senna S to 2 po BID BM this am LOS: 7 days A FACE TO FACE EVALUATION WAS PERFORMED  Erick Colacendrew E Elnoria Livingston 03/18/2018, 9:40 AM

## 2018-03-18 NOTE — Progress Notes (Addendum)
Physical Therapy Session Note  Patient Details  Name: [name] MRN: [id number] Date of Birth: [birth year]  Today's Date: 03/18/2018 PT Individual Time: 0905-1017 PT Individual Time Calculation (min): 72 min   Short Term Goals: Week 2:  PT Short Term Goal 1 (Week 2): STG = LTG due to estimated d/c date.  Skilled Therapeutic Interventions/Progress Updates:  Pt received in w/c & agreeable to tx, no c/o pain. Pt ambulates throughout unit with RW & supervision with minimal increase in gait speed with ongoing cuing. Pt completes car transfer at sedan simulated height with supervision and cuing for hand placement. Pt reports they have a big truck and car with therapist recommending pt ride home in car. Pt negotiates ramp with RW & close supervision and mulch with RW & min assist 2/2 LOB and max cuing to maintain RW on ground at all times, and to ambulate within base of AD. Pt negotiates 12 steps with B rails and supervision with cuing for compensatory pattern. Pt performed 1" step, 3" step, then cone taps with min<>mod assist for standing balance with task focusing on weight shifting L<>R, LE coordination & NMR. Pt utilized nu-step on level 4 x 10 minutes with max cuing to maintain steps per minute >55 with task focusing on coordination of reciprocal movements, attention to task, increased speed of movement, & L NMR; pt requires occasional cuing to maintain BUE on handles. Pt ambulates dayroom>room without AD & min assist with L hip weakness, but good LLE dorsiflexion & heel strike. Pt reports need to use restroom & completes toilet transfer with CGA. Pt with continent void on toilet and performed hand hygiene standing at sink with steady assist. At end of session pt left sitting in w/c in room with chair alarm donned, needs in reach.  Pt continues to demonstrate impaired awareness (anticipatory), initiation, and some recall of new information. Pt reports she was working up until initial stroke in  November.   Therapy Documentation Precautions:  Precautions Precautions: Fall Restrictions Weight Bearing Restrictions: No    Therapy/Group: Individual Therapy  Sandi MariscalVictoria M Eve Rey 03/18/2018, 10:48 AM

## 2018-03-18 NOTE — Plan of Care (Signed)
  Problem: RH BOWEL ELIMINATION Goal: RH STG MANAGE BOWEL WITH ASSISTANCE Description STG Manage Bowel with min. Assistance.  Outcome: Progressing Goal: RH STG MANAGE BOWEL W/MEDICATION W/ASSISTANCE Description STG Manage Bowel with Medication with min. Assistance.  Outcome: Progressing   Problem: RH BLADDER ELIMINATION Goal: RH STG MANAGE BLADDER WITH ASSISTANCE Description STG Manage Bladder With min.Assistance  Outcome: Progressing   Problem: RH SKIN INTEGRITY Goal: RH STG SKIN FREE OF INFECTION/BREAKDOWN Description With min. assist.  Outcome: Progressing Goal: RH STG MAINTAIN SKIN INTEGRITY WITH ASSISTANCE Description STG Maintain Skin Integrity With min.Assistance.  Outcome: Progressing   Problem: RH SAFETY Goal: RH STG ADHERE TO SAFETY PRECAUTIONS W/ASSISTANCE/DEVICE Description STG Adhere to Safety Precautions With min. Assistance/Device.  Outcome: Progressing   Problem: RH PAIN MANAGEMENT Goal: RH STG PAIN MANAGED AT OR BELOW PT'S PAIN GOAL Description Less than 3.  Outcome: Progressing   Problem: RH KNOWLEDGE DEFICIT Goal: RH STG INCREASE KNOWLEDGE OF DIABETES Outcome: Progressing Goal: RH STG INCREASE KNOWLEDGE OF HYPERTENSION Outcome: Progressing Goal: RH STG INCREASE KNOWLEGDE OF HYPERLIPIDEMIA Outcome: Progressing Goal: RH STG INCREASE KNOWLEDGE OF STROKE PROPHYLAXIS Outcome: Progressing   Problem: Consults Goal: RH STROKE PATIENT EDUCATION Description See Patient Education module for education specifics  Outcome: Progressing   

## 2018-03-18 NOTE — Progress Notes (Signed)
Occupational Therapy Weekly Progress Note  Patient Details  Name: [name] MRN: [id number] Date of Birth: 06/16/1965  Beginning of progress report period: March 12, 2018 End of progress report period: March 18, 2018  Today's Date: 03/18/2018 OT Individual Time: 0730-0830 OT Individual Time Calculation (min): 60 min    Patient has met 3 of 3 short term goals.  Pt is making steady progress towards goals.  Pt currently requires min guard with mobility and functional transfers with use of RW.  Pt continues to demonstrate Lt inattention during mobility, occasionally bumping RW in to doorway frame when exiting bathroom and bumping LLE on tub bench when transferring in to room shower.  Pt has demonstrated ability to complete bathing and dressing at supervision level.  Pt continues to require increased time for initiation, sequencing, and problem solving during functional tasks.  Pt has voiced concerns about her organization and processing of functional tasks.  Recommend supervision upon d/c due to cognitive impairments.    Patient continues to demonstrate the following deficits: muscle weakness, decreased cardiorespiratoy endurance, decreased initiation, decreased attention, decreased awareness, decreased problem solving, decreased safety awareness, decreased memory and delayed processing and decreased sitting balance, decreased standing balance, hemiplegia and decreased balance strategies and therefore will continue to benefit from skilled OT intervention to enhance overall performance with BADL and Reduce care partner burden.  Patient progressing toward long term goals..  Continue plan of care.  OT Short Term Goals Week 1:  OT Short Term Goal 1 (Week 1): Pt will perform shower transfer with min A. OT Short Term Goal 1 - Progress (Week 1): Met OT Short Term Goal 2 (Week 1): Pt will perform clothing management in standing with min A for standing balance.  OT Short Term Goal 2 - Progress  (Week 1): Met OT Short Term Goal 3 (Week 1): Pt will perform toilet transfer with min A. OT Short Term Goal 3 - Progress (Week 1): Met Week 2:  OT Short Term Goal 1 (Week 2): STG = LTGs due to remaining LOS  Skilled Therapeutic Interventions/Progress Updates:    Treatment session with focus on ADL retraining with functional mobility, bathroom transfers, and dynamic standing balance during bathing and dressing tasks.  Pt received seated EOB finishing breakfast.  Pt ambulated with RW with close supervision around room to gather clothing items prior to shower.  Pt completed undressing at sit > stand level from toilet and then ambulated to shower with RW with close supervision.  Pt completed all bathing at sit > stand level with supervision.  Dressing completed at sit > stand level from toilet with increased time for organization and initiation of task.  Pt requires increased time to initiate any movements.  Grooming tasks completed in standing with close supervision.  Engaged in walk-in shower transfers in ADL apt with focus on managing stepping over shower ledge.  Pt required min assist when stepping backwards over ledge and contact guard sit > stand from shower seat.  Engaged in discussion about shower seat v 3 in 1 for home use in shower - pt did not give any input during discussion.  Plan to further assess safety with shower transfers and DME during future therapy sessions.  Therapy Documentation Precautions:  Precautions Precautions: Fall Restrictions Weight Bearing Restrictions: No General:   Vital Signs: Therapy Vitals Temp: 98.3 F (36.8 C) Temp Source: Oral Pulse Rate: 88 Resp: 20 BP: (!) 180/90 Patient Position (if appropriate): Lying Oxygen Therapy SpO2: 99 % O2 Device: Room Air   Pain:  Pt with no c/o pain   Therapy/Group: Individual Therapy  ,  03/18/2018, 7:26 AM  

## 2018-03-18 NOTE — Progress Notes (Signed)
Occupational Therapy Session Note  Patient Details  Name: [name] MRN: [id number] Date of Birth: 1965/10/14  Today's Date: 03/18/2018 OT Individual Time: 1433-1530 OT Individual Time Calculation (min): 57 min   Short Term Goals: Week 2:  OT Short Term Goal 1 (Week 2): STG = LTGs due to remaining LOS  Skilled Therapeutic Interventions/Progress Updates:    Pt greeted seated in wc and agreeable to OT treatment session. Discussed home bathroom set-up and options for use of 3-in-1 BSC as shower chair, or purchasing actual shower chair. Practiced walk-in shower transfers with stepping over ledge with pt able to complete with increased time and close supervision. Pt reports she would like to use whatever is covered by insurance-have informed primary OT. Provided pt with walker bag and educated on use in kitchen and home environment to transport items. Practiced accessing microwave, fridge, and cabinets in simulated meal prep activity. Pt needed verbal cues throughout session to remember to close microwave or cabinets after opening the. B UE coordination and hand eye coordination with ball toss activity-chest pass and overhead pass. Functional ambulation without RW to room with min A and intermittent scissoring and LOB when distracted by conversation or someone walking by. Pt ambulated into bathroom and transferred onto commode with CGA. Pt urinated and completed peri-care without assistance. Provided pt with walker to ambulate out of bathroom to the sink w/ verbal cues for RW positioning. Pt returned to bed at end of session and left semi-reclined with needs met.   Therapy Documentation Precautions:  Precautions Precautions: Fall Restrictions [name] Bearing Restrictions: No Pain:   none/denies pain  Therapy/Group: Individual Therapy  Valma Cava 03/18/2018, 3:40 PM

## 2018-03-18 NOTE — Progress Notes (Addendum)
Social Work Patient ID: Jessica BreedingNancy Vassar, female   DOB: [birth year], 52 y.o.   MRN: [id number] Spoke with husband who reports he was told she would be here 2-3 weeks and now her discharge is set for Tuesday. He does not have 24 hr supervision for her until Friday. He wants to see if pt can stay longer, discussed will need to see if MD and team feel she can benefit and medically needs to be here. Will find out and get back with husband. Need to also have her insurance on board.

## 2018-03-19 ENCOUNTER — Inpatient Hospital Stay (HOSPITAL_COMMUNITY): Payer: 59 | Admitting: Physical Therapy

## 2018-03-19 ENCOUNTER — Inpatient Hospital Stay (HOSPITAL_COMMUNITY): Payer: 59 | Admitting: Occupational Therapy

## 2018-03-19 LAB — GLUCOSE, CAPILLARY
Glucose-Capillary: 118 mg/dL — ABNORMAL HIGH (ref 70–99)
Glucose-Capillary: 155 mg/dL — ABNORMAL HIGH (ref 70–99)
Glucose-Capillary: 135 mg/dL — ABNORMAL HIGH (ref 70–99)

## 2018-03-19 NOTE — Progress Notes (Signed)
Occupational Therapy Session Note  Patient Details  Name: [name] MRN: [id number] Date of Birth: June 26, 1965  Today's Date: 03/19/2018 OT Individual Time: [id number] OT Individual Time Calculation (min): 60 min    Short Term Goals: Week 2:  OT Short Term Goal 1 (Week 2): STG = LTGs due to remaining LOS  Skilled Therapeutic Interventions/Progress Updates:    Treatment session with focus on ADL retraining and organizational skills during dressing and meal planning activity.  Pt ambulated around room with RW to gather clothing prior to dressing at sit >[facility]  Pt completed all tasks with increased time and min cues for sequencing.  Pt completed grooming tasks in standing at sink with supervision.  Pt ambulated to ADL kitchen with RW with supervision and increased time due to distractibility from any and all stimulus when walking down the hallway.  Engaged in meal planning activity with focus on obtaining items for breakfast, lunch, and dinner within a specific dollar amount to challenge organization of thought processes.  Pt sorted and selected items by meal and then went back to check dollar amount.  Pt had gone over by $4, encouraged to remove an item to adhere to price constraints while also meeting entirety of task.  Pt ambulated back to room without RW with min/contact guard.  Returned to w/c and left upright in w/c with seat belt alarm on.    Husband present at end of session and discussed bladder incontinence overnight and urgency with therapist making recommendations to decrease bladder accidents.  Also discussed memory and organization of thought processes during functional, familiar tasks.  Therapy Documentation Precautions:  Precautions Precautions: Fall Restrictions Weight Bearing Restrictions: No Pain: Pt with no c/o pain  Therapy/Group: Individual Therapy  Rosalio LoudHOXIE, [place] Pettengill 03/19/2018, 12:25 PM

## 2018-03-19 NOTE — Progress Notes (Signed)
Patient ID: [name], female   DOB: 01/19/1966, 52 y.o.   MRN: [id number]   [name] is a 52 y.o. 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 Anxiety   . Chronic kidney disease   . Diabetes mellitus without complication (HCC)   . Fibromyalgia   . Headache   . History of kidney stones   . Hyperlipemia   . Hypertension   . Hypothyroidism   . Insomnia   . Pancreatitis    chronic  . Sleep apnea   . Stroke (cerebrum) (HCC)   . 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 No new problems. Slept well.   Objective: Vital signs in last 24 hours: Temp:  [97.8 F (36.6 C)-98.6 F (37 C)] 97.8 F (36.6 C) (11/30 0523) Pulse Rate:  [79-90] 79 (11/30 0523) Resp:  [18-19] 18 (11/30 0523) BP: (171-177)/(99-101) 171/99 (11/30 0523) SpO2:  [96 %-98 %] 97 % (11/30 0523) Weight:  [100.8 kg] 100.8 kg (11/29 1348) Weight change:  Last BM Date: 03/18/18  Intake/Output from previous day: 11/29 0701 - 11/30 0700 In: 462 [P.O.:462] Out: -  Last cbgs: CBG (last 3)  Recent Labs    03/18/18 1626 03/18/18 2030 03/19/18 0651  GLUCAP 111* 174* 118*   Lab Results  Component Value Date   HGBA1C 12.9 (H) 03/09/2018    Patient Vitals for the past 24 hrs:  BP Temp Temp src Pulse Resp SpO2 Weight  03/19/18 0523 (!) 171/99 97.8 F (36.6 C) - 79 18 97 % -  03/18/18 2027 (!) 177/101 98.6 F (37 C) Oral 90 18 96 % -  03/18/18 1702 (!) 172/101 98.5 F (36.9 C) Oral 81 19 98 % -  03/18/18 1348 - - - - - - 100.8 kg     Physical Exam General: No apparent distress   HEENT: Unremarkable Lungs: Normal effort. Lungs clear to auscultation, no crackles or wheezes. Cardiovascular: Regular rate and rhythm, no edema Abdomen: S/NT/ND; BS(+) Musculoskeletal:  unchanged Neurological: No new neurological deficits with mild left-sided weakness.  Able  to stand unassisted Wounds: N/A    Skin: clear   Mental state: Alert, oriented, cooperative    Lab Results: BMET    Component Value Date/Time   NA 139 03/14/2018 0657   K 3.8 03/14/2018 0657   CL 106 03/14/2018 0657   CO2 27 03/14/2018 0657   GLUCOSE 180 (H) 03/14/2018 0657   BUN 24 (H) 03/14/2018 0657   CREATININE 1.70 (H) 03/14/2018 0657   CALCIUM 8.7 (L) 03/14/2018 0657   GFRNONAA 34 (L) 03/14/2018 0657   GFRAA 39 (L) 03/14/2018 0657   CBC    Component Value Date/Time   WBC 9.4 03/14/2018 0657   RBC 4.56 03/14/2018 0657   HGB 12.7 03/14/2018 0657   HCT 40.2 03/14/2018 0657   PLT 324 03/14/2018 0657   MCV 88.2 03/14/2018 0657   MCH 27.9 03/14/2018 0657   MCHC 31.6 03/14/2018 0657   RDW 12.6 03/14/2018 0657   LYMPHSABS 2.1 03/14/2018 0657   MONOABS 0.7 03/14/2018 0657   EOSABS 0.7 (H) 03/14/2018 0657   BASOSABS 0.1 03/14/2018 0657    Medications: I have reviewed the patient's current medications.  Assessment/Plan:  Functional deficits following a right ACA/MCA territory infarction.  Continue CIR Diabetes mellitus.  Continue present regimen.  Nice glycemic control Essential hypertension.  Remains elevated.  Amlodipine started 03/17/2018.  Continue multidrug therapy Chronic kidney disease Seizure prophylaxis.  Continue Keppra    Length of stay, days: 8  Gordy SaversPeter F Jerman Tinnon , MD 03/19/2018, 9:58 AM

## 2018-03-19 NOTE — Progress Notes (Signed)
Physical Therapy Session Note  Patient Details  Name: [name] MRN: [id number] Date of Birth: 04-Apr-1966  Today's Date: 03/19/2018 PT Individual Time: 1545-1700 PT Individual Time Calculation (min): 75 min   Short Term Goals: Week 1:  PT Short Term Goal 1 (Week 1): Pt will ambulate 3' in w/ CGA PT Short Term Goal 1 - Progress (Week 1): Met PT Short Term Goal 2 (Week 1): Pt will maintain dynamic standing balance w/ min assist PT Short Term Goal 2 - Progress (Week 1): Met PT Short Term Goal 3 (Week 1): Pt will attend to L body during functional mobility w/o cues 50% of the time  PT Short Term Goal 3 - Progress (Week 1): Met PT Short Term Goal 4 (Week 1): Pt will perform be<>chair transfers w/ CGA PT Short Term Goal 4 - Progress (Week 1): Met  Skilled Therapeutic Interventions/Progress Updates:    Pt received seated on EOB and agreeable to PT.   Gait training throughout session with RW x156f, x1597fat supervision assist. Gait training without AD x70 ft, x5085fith CGA. Verbal cues for L LE foot placement, management of AD, and attention to L. When gait training without AD pt demonstrated tendency to sidestep to L when attention was divided. Mild LOB to L x2 with PT correcting.   Dynamic standing balance with pt putting together pipes at standing table x2. ~2 min in standing each time. Verbal cues for avoiding leaning on table.   Life sized ConTour managerirst time on firm surface, pt supervision assist. Second round with pt standing on airex pad, min assist for maintaining dynamic balance. Verbal cues to avoid posterior lean with standing.   Bridges with BLE and LLE. 2 sets each x10 reps. Tactile cuing to maintain LLE over ankle and verbal cuing for increasing hip extension with exercise.  Stair training. Ascend/descend 4 stairs. CGA with 2 handrails. Verbal cues for appropriate LE sequencing.  Lateral stepping on handrail in hallway down/back. Verbal cues with L sidesteps  to increase step length and maintain forward facing position during exercise. Supervision assist from SPT.   Obstacle course x3. Pt weaving through cones and stepping over obstacles with RW. Supervision assist from SPT. Verbal cues for navigating obstacles and AD management over obstacles.  Ended session with pt supine in bed, call bell within reach, and all needs met.  Therapy Documentation Precautions:  Precautions Precautions: Fall Restrictions Weight Bearing Restrictions: No   Vital Signs: Therapy Vitals Temp: 98.3 F (36.8 C) Temp Source: Oral Pulse Rate: 87 Resp: 16 BP: (!) 168/91 Patient Position (if appropriate): Lying Oxygen Therapy SpO2: 95 % O2 Device: Room Air Pain:  0/10   Therapy/Group: Individual Therapy  AmaAmador Cunas/30/2019, 5:51 PM

## 2018-03-20 ENCOUNTER — Inpatient Hospital Stay (HOSPITAL_COMMUNITY): Payer: 59

## 2018-03-20 LAB — GLUCOSE, CAPILLARY
Glucose-Capillary: 270 mg/dL — ABNORMAL HIGH (ref 70–99)
Glucose-Capillary: 109 mg/dL — ABNORMAL HIGH (ref 70–99)
Glucose-Capillary: 180 mg/dL — ABNORMAL HIGH (ref 70–99)
Glucose-Capillary: 174 mg/dL — ABNORMAL HIGH (ref 70–99)

## 2018-03-20 NOTE — Progress Notes (Signed)
Patient ID: [name], female   DOB: [birth year], 51 y.o.   MRN: [id number]   [name] is a 52 y.o. female who was admitted for CIR following a right ACA infarct with left hemiparesis  Past Medical History:  Diagnosis Date  . Anxiety   . Chronic kidney disease   . Diabetes mellitus without complication (HCC)   . Fibromyalgia   . Headache   . History of kidney stones   . Hyperlipemia   . Hypertension   . Hypothyroidism   . Insomnia   . Pancreatitis    chronic  . Sleep apnea   . Stroke (cerebrum) (HCC)   . 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 No new problems. Slept well.  Doing well  Objective: Vital signs in last 24 hours: Temp:  [98.2 F (36.8 C)-98.3 F (36.8 C)] 98.2 F (36.8 C) (12/01 0507) Pulse Rate:  [80-89] 89 (12/01 0507) Resp:  [16-18] 18 (12/01 0507) BP: (168-179)/(91-111) 179/111 (12/01 0507) SpO2:  [93 %-98 %] 98 % (12/01 0507) Weight change:  Last BM Date: 03/18/18  Intake/Output from previous day: 11/30 0701 - 12/01 0700 In: 720 [P.O.:720] Out: -  Last cbgs: CBG (last 3)  Recent Labs    03/19/18 1655 03/19/18 2114 03/20/18 0638  GLUCAP 155* 135* 109*   Patient Vitals for the past 24 hrs:  BP Temp Temp src Pulse Resp SpO2  03/20/18 0507 (!) 179/111 98.2 F (36.8 C) Oral 89 18 98 %  03/19/18 1935 (!) 179/93 98.2 F (36.8 C) Oral 80 18 93 %  03/19/18 1432 (!) 168/91 98.3 F (36.8 C) Oral 87 16 95 %     Physical Exam General: No apparent distress   HEENT: Unremarkable Lungs: Normal effort. Lungs clear to auscultation, no crackles or wheezes. Cardiovascular: Regular rate and rhythm, no edema Abdomen: S/NT/ND; BS(+) Musculoskeletal:  unchanged Neurological: No new neurological deficits with mild left-sided weakness Wounds: N/A    Skin: clear   Mental state: Alert, oriented, cooperative    Lab Results: BMET    Component Value Date/Time   NA 139 03/14/2018 0657   K 3.8 03/14/2018  0657   CL 106 03/14/2018 0657   CO2 27 03/14/2018 0657   GLUCOSE 180 (H) 03/14/2018 0657   BUN 24 (H) 03/14/2018 0657   CREATININE 1.70 (H) 03/14/2018 0657   CALCIUM 8.7 (L) 03/14/2018 0657   GFRNONAA 34 (L) 03/14/2018 0657   GFRAA 39 (L) 03/14/2018 0657   CBC    Component Value Date/Time   WBC 9.4 03/14/2018 0657   RBC 4.56 03/14/2018 0657   HGB 12.7 03/14/2018 0657   HCT 40.2 03/14/2018 0657   PLT 324 03/14/2018 0657   MCV 88.2 03/14/2018 0657   MCH 27.9 03/14/2018 0657   MCHC 31.6 03/14/2018 0657   RDW 12.6 03/14/2018 0657   LYMPHSABS 2.1 03/14/2018 0657   MONOABS 0.7 03/14/2018 0657   EOSABS 0.7 (H) 03/14/2018 0657   BASOSABS 0.1 03/14/2018 0657    No results found.  Medications: I have reviewed the patient's current medications.  Assessment/Plan:  Status post right ACA/MCA territory infarction with left-sided weakness.  Continue CIR Diabetes mellitus.  Continues to be well controlled Essential hypertension.  Blood pressure is up today.  Amlodipine 5 mg started November 28.  We will continue to observe on present regimen Chronic kidney disease Seizure prophylaxis.  Continue Keppra    Length of stay, days: 9  Gordy Savers , MD  03/20/2018, 9:53 AM

## 2018-03-20 NOTE — Progress Notes (Signed)
Physical Therapy Session Note  Patient Details  Name: [name] MRN: [id number] Date of Birth: [birth year]  Today's Date: 03/20/2018 PT Individual Time: 1102-1200 PT Individual Time Calculation (min): 58 min   Short Term Goals: Week 2:  PT Short Term Goal 1 (Week 2): STG = LTG due to estimated d/c date.  Skilled Therapeutic Interventions/Progress Updates:    Pt seated in w/c upon PT arrival, agreeable to therapy tx and denies pain. Pt transported to dayroom. Pt participated in dancing this session while therapist provided one on one direct treatment. Pt performed sit<>stands x10 throughout session with RW and supervision. In standing pt worked on standing balance with and without UE support while dancing to music. Standing with RW pt performed x 20 marches with 3# ankle weights. Pt worked on LE strengthening throughout session with 3# ankle weights and kicking LEs to music. Pt worked on UE strength throughout session with 1.5# weights performing UE arm raises, shoulder flexion, shoulder abduction, shoulder press and bicep curls. Pt performed forwards/backwards walking 8 x 5 ft with RW and 360 degree turns with RW, supervision-CGA. Pt performed side stepping in place this session with RW and supervision, x 10 steps to each side working on balance and hip strengthening. Pt transported back to room and left seated in w/c with needs in reach and chair alarm set.   Therapy Documentation Precautions:  Precautions Precautions: Fall Restrictions Weight Bearing Restrictions: No   Therapy/Group: Individual Therapy  Cresenciano GenreEmily van Schagen, PT, DPT 03/20/2018, 7:55 AM

## 2018-03-21 ENCOUNTER — Inpatient Hospital Stay (HOSPITAL_COMMUNITY): Payer: Self-pay

## 2018-03-21 ENCOUNTER — Inpatient Hospital Stay (HOSPITAL_COMMUNITY): Payer: 59

## 2018-03-21 ENCOUNTER — Inpatient Hospital Stay (HOSPITAL_COMMUNITY): Payer: 59 | Admitting: Occupational Therapy

## 2018-03-21 LAB — GLUCOSE, CAPILLARY
Glucose-Capillary: 219 mg/dL — ABNORMAL HIGH (ref 70–99)
Glucose-Capillary: 123 mg/dL — ABNORMAL HIGH (ref 70–99)
Glucose-Capillary: 102 mg/dL — ABNORMAL HIGH (ref 70–99)
Glucose-Capillary: 196 mg/dL — ABNORMAL HIGH (ref 70–99)
Glucose-Capillary: 176 mg/dL — ABNORMAL HIGH (ref 70–99)

## 2018-03-21 MED ORDER — AMLODIPINE BESYLATE 10 MG PO TABS
10.0000 mg | ORAL_TABLET | Freq: Every day | ORAL | Status: DC
  Administered 2018-03-22 – 2018-03-23 (×2): 10 mg via ORAL
  Filled 2018-03-21 (×3): qty 1

## 2018-03-21 MED ORDER — AMLODIPINE BESYLATE 5 MG PO TABS
5.0000 mg | ORAL_TABLET | Freq: Once | ORAL | Status: AC
  Administered 2018-03-21: 5 mg via ORAL
  Filled 2018-03-21: qty 1

## 2018-03-21 NOTE — Progress Notes (Signed)
Occupational Therapy Session Note  Patient Details  Name: [name] MRN: [id number] Date of Birth: February 19, 1966  Today's Date: 03/21/2018 OT Individual Time: 5997-7414 OT Individual Time Calculation (min): 88 min    Short Term Goals: Week 1:  OT Short Term Goal 1 (Week 1): Pt will perform shower transfer with min A. OT Short Term Goal 1 - Progress (Week 1): Met OT Short Term Goal 2 (Week 1): Pt will perform clothing management in standing with min A for standing balance.  OT Short Term Goal 2 - Progress (Week 1): Met OT Short Term Goal 3 (Week 1): Pt will perform toilet transfer with min A. OT Short Term Goal 3 - Progress (Week 1): Met Week 2:  OT Short Term Goal 1 (Week 2): STG = LTGs due to remaining LOS      Skilled Therapeutic Interventions/Progress Updates:      Pt seen for BADL retraining of toileting, bathing, and dressing with a focus on use of LUE, L side awareness, sequencing.  Pt received in w/c and agreeable to a shower.  She used her RW to ambulate in room to bathroom to toilet and then to shower all with S.  She showered with distant S and then returned to w/c to dress with set up.  She stood at sink to complete grooming.  No L inattention noted and no  Cuing needed as pt was able to sequence tasks independently.  Pt then ambulated to gym with RW.  She sat to work on table top activities for L [facility] as her job is Data processing manager.  Pt worked on cutting small strips of paper (holding paper in L hand), folding paper,  Squeezing a clothes pin,  Beading small beads onto metal wire (bracelet making),  Assembling the perdue peg board.   She also worked on problem solving and visual attention with a small puzzle.    Suggested she work on meal prep activities, but pt stated her spouse does all the cooking.  With walking back to the room, pt did bump the walker into a door on her left and self corrected.  Pt resting in w/c with all needs met and chair alarm on.   Therapy  Documentation Precautions:  Precautions Precautions: Fall Restrictions Weight Bearing Restrictions: No      Pain: Pain Assessment Pain Scale: 0-10 Pain Score: 0-No pain      Therapy/Group: Individual Therapy  [place] 03/21/2018, 12:32 PM

## 2018-03-21 NOTE — Discharge Summary (Signed)
NAMELOWANDA, [name] MEDICAL RECORD OZ:[id number] ACCOUNT [id number] DATE OF BIRTH:[birth year] FACILITY: [facility] LOCATION: [facility] PHYSICIAN:ANDREW Wynn Banker, MD  DISCHARGE SUMMARY  DATE OF DISCHARGE:  03/22/2018  ADMISSION DATE:  03/11/2018  DATE OF DISCHARGE:   03/23/2018  DISCHARGE DIAGNOSES: 1.  Right anterior cerebral artery infarction, multivessel intracranial disease. 2.  Sequential compression devices for deep venous thrombosis prophylaxis. 3.  Pain management. 4.  Seizure disorder. 5.  Diabetes mellitus. 6.  Chronic kidney disease, stage II. 7.  Hypertension 8.  Hyperlipidemia 9.  Chronic constipation.  HOSPITAL COURSE:  This is a 52 year old right-handed female with history of hypertension, diabetes mellitus, CKD stage II, fibromyalgia, CVA in 2014 with residual left-sided weakness noncompliant with medications.  Recent admission 03/02/2018-03/04/2018  for increasing left-sided weakness as well as reported new onset of seizure, started on Keppra.  MRI at that time showed right posterior ACA territory infarction.  Maintained on aspirin and Plavix.  EEG negative for seizure.  She was discharged to home  with home health therapies after declining skilled nursing facility.  She lives with her husband who works during the day.  The patient later presented to [facility] on 03/08/2018 with increasing left lower extremity weakness, blurred vision.   Cranial CT scan showed subacute infarction, right ACA territory.  Remote right occipital and inferior cerebellar infarction without intracranial hemorrhage.  MRI showed slight interval expansion worsening of the right ACA territory infarction as compared  to previous studies.  CTA concerning for significant right MCA disease.  Neurology consulted as well as interventional radiology.  Transferred to [facility] for evaluation.  Cerebral angiogram completed 03/09/2018 showing an occluded right ACA  junction, severe stenosis, left  MCA M1 segment.  Neurology followup.  Maintained on aspirin and Plavix therapy x3 months with interventional radiology for repeat arteriogram.  Therapy evaluations completed, the patient was admitted for comprehensive  rehabilitation program.  PAST MEDICAL HISTORY:  See discharge diagnoses.  SOCIAL HISTORY:  Lives with husband who works during the day.  FUNCTIONAL STATUS:  Upon admission to rehab services was moderate assist with 80 feet rolling walker, moderate assist with stand pivot transfers, mod/max assist with activities of daily living.  PHYSICAL EXAMINATION: VITAL SIGNS:  Blood pressure 163/97, pulse 87, temperature 98, respirations 20. GENERAL:  Alert female.  Slow to process.  Answers basic biographical questions.  Mild left sided attention. HEENT:  EOMs intact. NECK:  Supple, nontender, no JVD. CARDIOVASCULAR:  Rate controlled. ABDOMEN:  Soft, nontender, good bowel sounds. LUNGS:  Clear to auscultation without wheeze.  REHABILITATION HOSPITAL COURSE:  The patient was admitted to inpatient rehabilitation services.  Therapies initiated on a 3-hour daily basis, consisting of physical therapy, occupational therapy, speech therapy and rehabilitation nursing.  The following  issues were addressed during patient's rehabilitation stay.  Pertaining to the patient's right ICA infarction, currently on aspirin and Plavix therapy.  Plan for cerebral angiogram as outpatient.  She will continue on aspirin and Plavix at this time.   SCDs for DVT prophylaxis.  Mood stabilization with Seroquel.  She remained on Keppra for seizure prophylaxis.  No seizure activity.  Latest EEG negative.  Hemoglobin A1c of 12.9.  Insulin therapy as directed.  The patient noted to be noncompliant with  full diabetic teaching completed.  Blood pressure permissive with Norvasc increased to 10 mg daily and Coreg titrated to 25 mg twice a day.  She would follow up with her primary MD.  She is continued on Lipitor for  hyperlipidemia.  The patient  received weekly collaborative interdisciplinary team conferences to discuss estimated length of stay, family teaching, any barriers to her discharge.  She was ambulating 70 feet contact guard assist.  Needed some verbal cues for left lower  extremity foot placement.  Working with dynamic standing balance, up and down stairs contact guard assist using handrails, activities of daily living and homemaking.  Ambulates from her room to gather clothing prior to dressing, sit to stand level  bedside commode.  Completed all task with some increased time, minimal cueing.  Full family teaching completed and planned discharge to home.  DISCHARGE MEDICATIONS:  Included Norvasc 5 mg p.o. daily, aspirin 81 mg p.o. daily, Lipitor 80 mg p.o. at bedtime, Plavix 75 mg p.o. daily, Cymbalta 60 mg p.o. daily, Lantus insulin 34 units at bedtime, Keppra 500 mg p.o. b.i.d., Seroquel 500 mg p.o. at  Bedtime, Coreg 25 mg twice a day Senokot-S 2 tablets p.o. b.i.d., Tylenol as needed.  DIET:  Diabetic diet.    FOLLOWUP:  She would follow up with Dr. Claudette LawsAndrew [name] at the outpatient rehab center as advised; Dr. Delia HeadyPramod [name], call for appointment; Dr. Abigail MiyamotoLawrence Edward [name], medical management.  SPECIAL INSTRUCTIONS:  Continue aspirin and Plavix x3 months with followup with interventional radiology for repeat arteriogram.  TN/NUANCE D:03/21/2018 T:03/21/2018 JOB:[id number]

## 2018-03-21 NOTE — Discharge Instructions (Signed)
Inpatient Rehab Discharge Instructions  Minus Breedingancy Wands Discharge date and time: No discharge date for patient encounter.   Activities/Precautions/ Functional Status: Activity: activity as tolerated Diet: diabetic diet Wound Care: none needed Functional status:  ___ No restrictions     ___ Walk up steps independently ___ 24/7 supervision/assistance   ___ Walk up steps with assistance ___ Intermittent supervision/assistance  ___ Bathe/dress independently ___ Walk with walker     _x__ Bathe/dress with assistance ___ Walk Independently    ___ Shower independently ___ Walk with assistance    ___ Shower with assistance ___ No alcohol     ___ Return to work/school ________  Special Instructions: No driving  Follow-up outpatient cerebral arteriogram to address 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   COMMUNITY REFERRALS UPON DISCHARGE:    Home Health:   PT, OT, AIDE    Agency:[facility] [phone]   Date of last service:03/23/2018  Medical Equipment/Items Ordered:3 IN 1  Agency/Supplier:[facility]   [phone]   GENERAL COMMUNITY RESOURCES FOR PATIENT/FAMILY: Support Groups:[facility] SUPPORT GROUP THE SECOND Thursday OF EACH MONTH @ 6:00-7:00 PM ON THE REHAB UNIT QUESTIONS CONTACT AMY [phone]  STROKE/TIA DISCHARGE INSTRUCTIONS SMOKING Cigarette smoking nearly doubles your risk of having a stroke & is the single most alterable risk factor  If you smoke or have smoked in the last 12 months, you are advised to quit smoking for your health.  Most of the excess cardiovascular risk related to smoking disappears within a year of stopping.  Ask you doctor about anti-smoking medications  [place] Quit Line: 1-800-QUIT NOW  Free Smoking Cessation Classes [phone]  CHOLESTEROL Know your levels; limit fat & cholesterol in your diet  Lipid Panel     Component Value Date/Time   CHOL 313 (H) 03/09/2018 0448   TRIG 529 (H) 03/09/2018 0448     HDL 36 (L) 03/09/2018 0448   CHOLHDL 8.7 03/09/2018 0448   VLDL UNABLE TO CALCULATE IF TRIGLYCERIDE OVER 400 mg/dL 52/84/132411/20/2019 [id number]   LDLCALC UNABLE TO CALCULATE IF TRIGLYCERIDE OVER 400 mg/dL 27/25/366411/20/2019 [id number]      Many patients benefit from treatment even if their cholesterol is at goal.  Goal: Total Cholesterol (CHOL) less than 160  Goal:  Triglycerides (TRIG) less than 150  Goal:  HDL greater than 40  Goal:  LDL (LDLCALC) less than 100   BLOOD PRESSURE American Stroke Association blood pressure target is less that 120/80 mm/Hg  Your discharge blood pressure is:  BP: (!) 181/99  Monitor your blood pressure  Limit your salt and alcohol intake  Many individuals will require more than one medication for high blood pressure  DIABETES (A1c is a blood sugar average for last 3 months) Goal HGBA1c is under 7% (HBGA1c is blood sugar average for last 3 months)  Diabetes:   Lab Results  Component Value Date   HGBA1C 12.9 (H) 03/09/2018     Your HGBA1c can be lowered with medications, healthy diet, and exercise.  Check your blood sugar as directed by your physician  Call your physician if you experience unexplained or low blood sugars.  PHYSICAL ACTIVITY/REHABILITATION Goal is 30 minutes at least 4 days per week  Activity: Increase activity slowly, Therapies: Physical Therapy: Home Health Return to work:   Activity decreases your risk of heart attack and stroke and makes your heart stronger.  It helps control your weight and blood pressure; helps you relax and can improve your mood.  Participate in a regular exercise  program.  Talk with your doctor about the best form of exercise for you (dancing, walking, swimming, cycling).  DIET/WEIGHT Goal is to maintain a healthy weight  Your discharge diet is:  Diet Order            Diet Carb Modified Fluid consistency: Thin; Room service appropriate? Yes  Diet effective now              liquids Your height is:  Height: 5\' 5"   (165.1 cm) Your current weight is: Weight: 102.4 kg Your Body Mass Index (BMI) is:  BMI (Calculated): 37.57  Following the type of diet specifically designed for you will help prevent another stroke.  Your goal weight range is:    Your goal Body Mass Index (BMI) is 19-24.  Healthy food habits can help reduce 3 risk factors for stroke:  High cholesterol, hypertension, and excess weight.  RESOURCES Stroke/Support Group:  Call [phone]   STROKE EDUCATION PROVIDED/REVIEWED AND GIVEN TO PATIENT Stroke warning signs and symptoms How to activate emergency medical system (call 911). Medications prescribed at discharge. Need for follow-up after discharge. Personal risk factors for stroke. Pneumonia vaccine given:  Flu vaccine given:  My questions have been answered, the writing is legible, and I understand these instructions.  I will adhere to these goals & educational materials that have been provided to me after my discharge from the hospital.      My questions have been answered and I understand these instructions. I will adhere to these goals and the provided educational materials after my discharge from the hospital.  Patient/Caregiver Signature _______________________________ Date __________  Clinician Signature _______________________________________ Date __________  Please bring this form and your medication list with you to all your follow-up doctor's appointments.

## 2018-03-21 NOTE — Discharge Summary (Signed)
Discharge summary job [phone]

## 2018-03-21 NOTE — Progress Notes (Signed)
Social Work Patient ID: [name], female   DOB: 19-Jun-1965, 52 y.o.   MRN: [id number] MD feels can monitor BP one more day and discharge Wed. Will let team know and work toward discharge Wed. Will inform husband.

## 2018-03-21 NOTE — Progress Notes (Signed)
Social Work  Discharge Note  The overall goal for the admission was met for:   Discharge location: [place]  Length of Stay: Yes-12 DAYS  Discharge activity level: Yes-SUPERVISION LEVEL   Home/community participation: Yes  Services provided included: MD, RD, PT, OT, SLP, RN, CM, TR, Pharmacy, Neuropsych and SW  Financial Services: Private Insurance: [place]  Follow-up services arranged: Home Health: [facility]-PT, OT, AIDE, DME: [facility]- 3 IN 1 and Patient/Family request agency HH: ACTIVE PATIENT, DME: NO PREF  Comments (or additional information):PT DID WELL AND [place]. HUSBAND TO PROVIDE SUPERVISION LEVEL AT DISCHARGE  Patient/Family verbalized understanding of follow-up arrangements: Yes  Individual responsible for coordination of the follow-up plan: SELF & ANDY-HUSBAND  Confirmed correct DME delivered: [name] 03/21/2018    [name]

## 2018-03-21 NOTE — Progress Notes (Signed)
Physical Therapy Session Note  Patient Details  Name: Minus Breedingancy Mangual MRN: [id number] Date of Birth: 06-13-1965  Today's Date: 03/21/2018 PT Individual Time: 0900-1000 PT Individual Time Calculation (min): 60 min   Short Term Goals: Week 2:  PT Short Term Goal 1 (Week 2): STG = LTG due to estimated d/c date.  Skilled Therapeutic Interventions/Progress Updates:    Pt seated in w/c upon PT arrival, agreeable to therapy tx and denies pain. Pt ambulated from room>dayroom x 150 ft with RW and supervision. Pt used nustep x 6 minutes on workload 5 for global strengthening and endurance. Pt ambulated from dayroom>gym x 150 ft with supervision and RW. Pt performed x 10 sit<>stands without UE support for LE strengthening. Pt worked on dynamic standing balance this session without UE support to perform toe taps on 4 inch step min assist, ambulation without AD x 100 ft CGA, forwards/backwards walking min-mod assist, sidestepping with min assist. Pt ambulated to steps without AD 2 x 50 ft CGA, ascended/descended 8 steps with B handrails and CGA, step to pattern. Pt ambulated to the mat and transferred to quadruped with min assist. In quadruped for neuro re-ed pt performed R LE extensions x 10 and R UE flexion x 10. Pt ambulated back to room and left seated in w/c with needs in reach, chair alarm set.    BP 158/97 after activity.    Therapy Documentation Precautions:  Precautions Precautions: Fall Restrictions Weight Bearing Restrictions: No    Therapy/Group: Individual Therapy  Cresenciano GenreEmily van Schagen, PT, DPT 03/21/2018, 9:03 AM

## 2018-03-21 NOTE — Progress Notes (Signed)
Physical Therapy Discharge Summary  Patient Details  Name: Tea Collums MRN: [id number] Date of Birth: 12-04-1965  Today's Date: 03/22/2018 PT Individual Time: 4037-0964 PT Individual Time Calculation (min): 70 min    Patient has met 12 of 12 long term goals due to improved activity tolerance, improved balance, improved postural control, increased strength, ability to compensate for deficits and improved awareness.  Patient to discharge at an ambulatory level Supervision.   Patient's care partner is independent to provide the necessary cognitive assistance at discharge. Therapist recommending 24/7 supervision from spouse upon d/c.   All goals met.  Recommendation:  Patient will benefit from ongoing skilled PT services in home health setting to continue to advance safe functional mobility, address ongoing impairments in balance, attention, awareness, strength, and minimize fall risk.  Equipment: No equipment provided  Reasons for discharge: treatment goals met  Patient/family agrees with progress made and goals achieved: Yes   PT Treatment Interventions: Pt received in dayroom from OT, agreeable to therapy tx and denies pain. Pt ambulated throughout session with RW and supervision 200+ft and can ambulate without AD 150 ft with CGA. Therapist recommending pt use RW at all times at home, ambulation without AD only to be done in therapy, pt agreeable. Pt performed car transfer this session with RW and supervision, therapist recommending not to go home in truck. Pt ambulated to rehab apartment and performed bed transfer with supervision, bed mobility with supervision. Pt worked on ambulation this session without AD, CGA 2 x 100 ft. Pt participated in berg balance test and tug as detailed below, educated pt on current fall risk. Pt ascended/descended 12 steps with B rails and supervision-CGA. Pt used nustep x 8 minutes for global strengthening and NMR, resistance level 5. Pt perform floor transfer  this session with CGA. Pt ambulated back to room and left seated in w/c with needs in reach and chair alarm set.   PT Discharge Precautions/Restrictions Precautions Precautions: Fall Restrictions Weight Bearing Restrictions: No Vital Signs Therapy Vitals Pulse Rate: 100 Pain Pain Assessment Pain Scale: 0-10 Pain Score: 0-No pain Cognition Overall Cognitive Status: History of cognitive impairments - at baseline Arousal/Alertness: Awake/alert Orientation Level: Oriented X4 Attention: Sustained Sustained Attention: Appears intact Memory: Impaired Comments: mild impulsivity, L inattention, and decreased safety awareness Sensation Sensation Light Touch: Impaired by gross assessment(decreased sensation L LE compated to R) Proprioception: Appears Intact Coordination Gross Motor Movements are Fluid and Coordinated: No Fine Motor Movements are Fluid and Coordinated: No Motor  Motor Motor - Discharge Observations: mild left hemiparesis  Mobility Bed Mobility Bed Mobility: Rolling Right;Rolling Left;Supine to Sit;Sit to Supine Rolling Right: Supervision/verbal cueing Rolling Left: Supervision/Verbal cueing Supine to Sit: Supervision/Verbal cueing Sit to Supine: Supervision/Verbal cueing Transfers Transfers: Sit to Stand;Stand to Sit;Stand Pivot Transfers Sit to Stand: Supervision/Verbal cueing Stand to Sit: Supervision/Verbal cueing Stand Pivot Transfers: Supervision/Verbal cueing Stand Pivot Transfer Details: Verbal cues for precautions/safety;Manual facilitation for placement Transfer (Assistive device): Rolling walker Locomotion  Gait Ambulation: Yes Gait Assistance: Supervision/Verbal cueing Gait Distance (Feet): 200 Feet Assistive device: Rolling walker Gait Assistance Details: Verbal cues for precautions/safety Gait Gait: Yes Stairs / Additional Locomotion Stairs: Yes Stairs Assistance: Contact Guard/Touching assist;Supervision/Verbal cueing Stair Management  Technique: Two rails Number of Stairs: 12 Height of Stairs: 6  Trunk/Postural Assessment  Cervical Assessment Cervical Assessment: Within Functional Limits Thoracic Assessment Thoracic Assessment: Within Functional Limits Lumbar Assessment Lumbar Assessment: Within Functional Limits Postural Control Postural Control: Within Functional Limits  Balance Balance Balance Assessed: Yes Standardized Balance  Assessment Standardized Balance Assessment: Timed Up and Go Test;Berg Balance Test Berg Balance Test Sit to Stand: Able to stand without using hands and stabilize independently Standing Unsupported: Able to stand 2 minutes with supervision Sitting with Back Unsupported but Feet Supported on Floor or Stool: Able to sit safely and securely 2 minutes Stand to Sit: Sits safely with minimal use of hands Transfers: Able to transfer safely, definite need of hands Standing Unsupported with Eyes Closed: Able to stand 10 seconds with supervision Standing Ubsupported with Feet Together: Able to place feet together independently and stand for 1 minute with supervision From Standing, Reach Forward with Outstretched Arm: Can reach forward >12 cm safely (5") From Standing Position, Pick up Object from Floor: Able to pick up shoe, needs supervision From Standing Position, Turn to Look Behind Over each Shoulder: Looks behind one side only/other side shows less weight shift Turn 360 Degrees: Able to turn 360 degrees safely but slowly Standing Unsupported, Alternately Place Feet on Step/Stool: Able to complete >2 steps/needs minimal assist Standing Unsupported, One Foot in Front: Able to take small step independently and hold 30 seconds Standing on One Leg: Tries to lift leg/unable to hold 3 seconds but remains standing independently Total Score: 39 Timed Up and Go Test TUG: Normal TUG Normal TUG (seconds): 21.05(RW, supervision) Static Sitting Balance Static Sitting - Level of Assistance: 5: Stand by  assistance Dynamic Sitting Balance Dynamic Sitting - Level of Assistance: 5: Stand by assistance Static Standing Balance Static Standing - Level of Assistance: 5: Stand by assistance Dynamic Standing Balance Dynamic Standing - Level of Assistance: 5: Stand by assistance Extremity Assessment   RLE Assessment RLE Assessment: Within Functional Limits LLE Assessment Passive Range of Motion (PROM) Comments: WNL General Strength Comments: globally 4/5     Netta Corrigan, PT, DPT 03/22/2018, 11:07 AM

## 2018-03-21 NOTE — Progress Notes (Signed)
[facility] PHYSICAL MEDICINE & REHABILITATION PROGRESS NOTE   Subjective/Complaints:  No issues overnite , no pains  ROS- denies CP, SOB, N/V/D  Objective:   No results found. No results for input(s): WBC, HGB, HCT, PLT in the last 72 hours. No results for input(s): NA, K, CL, CO2, GLUCOSE, BUN, CREATININE, CALCIUM in the last 72 hours.  Intake/Output Summary (Last 24 hours) at 03/21/2018 0807 Last data filed at 03/20/2018 1733 Gross per 24 hour  Intake 960 ml  Output -  Net 960 ml     Physical Exam: Vital Signs Blood pressure (!) 171/94, pulse 88, temperature 97.9 F (36.6 C), temperature source Oral, resp. rate 15, height 5\' 5"  (1.651 m), weight 100.8 kg, SpO2 94 %.  General: No acute distress Mood and affect are appropriate Heart: Regular rate and rhythm no rubs murmurs or extra sounds Lungs: Clear to auscultation, breathing unlabored, no rales or wheezes Abdomen: Positive bowel sounds, soft nontender to palpation, nondistended Extremities: No clubbing, cyanosis, or edema Skin: No evidence of breakdown, no evidence of rash Neurologic: Cranial nerves II through XII intact, motor strength is 5/5 in right and 4/5 left deltoid, bicep, tricep, grip, hip flexor, knee extensors, ankle dorsiflexor and plantar flexor Sensory exam normal sensation to light touch and proprioception in bilateral upper and lower extremities Cerebellar exam normal finger to nose to finger as well as heel to shin in bilateral upper and lower extremities Musculoskeletal: Full range of motion in all 4 extremities. No joint swelling    Assessment/Plan: 1. 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  which require 3+ hours per day of interdisciplinary therapy in a comprehensive inpatient rehab setting.  Physiatrist is providing close team supervision and 24 hour management of active medical problems listed below.  Physiatrist and rehab team continue to assess barriers to discharge/monitor patient  progress toward functional and medical goals  Care Tool:  Bathing  Bathing activity did not occur: Refused Body parts bathed by patient: Right arm, Left arm, Chest, Abdomen, Front perineal area, Right upper leg, Left upper leg, Face, Buttocks, Right lower leg, Left lower leg   Body parts bathed by helper: Right lower leg, Left lower leg, Buttocks     Bathing assist Assist Level: Supervision/Verbal cueing     Upper Body Dressing/Undressing Upper body dressing   What is the patient wearing?: Bra, Pull over shirt    Upper body assist Assist Level: Supervision/Verbal cueing    Lower Body Dressing/Undressing Lower body dressing      What is the patient wearing?: Pants, Incontinence brief     Lower body assist Assist for lower body dressing: Supervision/Verbal cueing     Toileting Toileting    Toileting assist Assist for toileting: Moderate Assistance - Patient 50 - 74%     Transfers Chair/bed transfer  Transfers assist  Chair/bed transfer activity did not occur: Safety/medical concerns  Chair/bed transfer assist level: Minimal Assistance - Patient > 75%     Locomotion Ambulation   Ambulation assist      Assist level: Supervision/Verbal cueing Assistive device: Walker-rolling Max distance: 170   Walk 10 feet activity   Assist     Assist level: Supervision/Verbal cueing Assistive device: Walker-rolling   Walk 50 feet activity   Assist    Assist level: Supervision/Verbal cueing Assistive device: Walker-rolling    Walk 150 feet activity   Assist Walk 150 feet activity did not occur: Safety/medical concerns  Assist level: Supervision/Verbal cueing Assistive device: Walker-rolling    Walk 10  feet on uneven surface  activity   Assist Walk 10 feet on uneven surfaces activity did not occur: Safety/medical concerns   Assist level: Minimal Assistance - Patient > 75% Assistive device: Photographer Will patient  use wheelchair at discharge?: (tbd) Type of Wheelchair: Manual    Wheelchair assist level: Supervision/Verbal cueing Max wheelchair distance: 150'    Wheelchair 50 feet with 2 turns activity    Assist        Assist Level: Supervision/Verbal cueing   Wheelchair 150 feet activity     Assist Wheelchair 150 feet activity did not occur: Safety/medical concerns   Assist Level: Supervision/Verbal cueing    Medical Problem List and Plan: 1.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 DVT Prophylaxis/Anticoagulation: SCDs. 3. Pain Management:Cymbalta 60 mg daily 4. Mood:Seroquel 50 mg daily at bedtime, appears bright, she is on Seroquel chronically at home 5. Neuropsych: This patientiscapable of making decisions on herown behalf. 6. Skin/Wound Care:Routine skin checks 7. Fluids/Electrolytes/Nutrition:Routine and out's with follow-up chemistries 8. Seizure disorder. Keppra 500 mg twice a day 9.Diabetes mellitus with peripheral neuropathy. Hemoglobin A1c 12.9.Lantus insulin 30 units daily at bedtime. Check blood sugars before meals and at bedtime. Diabetic teaching CBG (last 3)  Recent Labs    03/20/18 1208 03/20/18 1631 03/21/18 0627  GLUCAP 180* 174* 123*  Controlled 12/2 Vitals:   03/21/18 0504 03/21/18 0548  BP: (!) 196/109 (!) 171/94  Pulse: 86 88  Resp: 15   Temp: 97.9 F (36.6 C)   SpO2: 94%   Elevated BP-increase amlodipine 10 mg/day, may need an extra day to manage prior to d/c   10. CKD stage II. Follow-up chemistries BMET with mild elevation of BUN and creat over baseline, enc fluids, I yesterday, improved, will monitor  11. Hypertension. Permissive hypertension. Patient on Norvasc 10  mg daily, Coreg 25 mg twice a day,hygroton 25 mg daily, hydralazine 50 mg 3 times a day, lisinopril 40 mg daily prior to admission. Resume as needed 12. Hyperlipidemia. Lipitor  13.  Chronic constipation increase senna S to 2 po BID  LOS: 10 days A FACE TO FACE EVALUATION WAS PERFORMED  Erick Colace 03/21/2018, 8:07 AM

## 2018-03-21 NOTE — Progress Notes (Signed)
Physical Therapy Session Note  Patient Details  Name: [name] MRN: [id number] Date of Birth: February 10, 1966  Today's Date: 03/21/2018 PT Individual Time: 1300-1350 PT Individual Time Calculation (min): 50 min   Short Term Goals:  Week 2:  PT Short Term Goal 1 (Week 2): STG = LTG due to estimated d/c date.      Skilled Therapeutic Interventions/Progress Updates:   Pt seated in w/c.  Gait training on unit on level tile with RW, close supervision.  neuromuscular re-education via demo, multimodal cues for pelvic dissociation via reciprocal scooting in sitting, wihtout LE support or UE support.  Sustained stretch and balance challenge, standing with forefeet on wedge, bil UE support fading to 0UE support, x 2 minutes without LOB; slight A-P swaying. Supine lying with LLE hanging off of edge of mat, attempting unilateral bridging without UE support; pt with minimal elevation of hips.  Body habitus and poor recruitment of L gluteal muscles limits her.  Gait training without AD, carrying RW in bil hands, to return to room at end of session, CGA.  Therapeutic activity in unsupported sitting, reaching out of BOS to R/L to promote trunk shortening/lengthening, rotating while retrieving and replacing playing cards.  Standing with RUE support for kicking cones with R foot to force full wt bearing LLE; no buckling or LOB.  "Knee walking" on mat forward/backward to manipulate clothes pins onto bar; cues to wt shift sufficiently to allow full movement of LLE. From standing, pt picked up 6 cones from floor using bil hands, CGA.    Pt left resting in w/c with needs at hand and seat belt alarm set.     Therapy Documentation Precautions:  Precautions Precautions: Fall Restrictions Weight Bearing Restrictions: No Pain: Pain Assessment Pain Score: 0-No pain      Therapy/Group: Individual Therapy  Ezariah Nace 03/21/2018, 3:03 PM

## 2018-03-22 ENCOUNTER — Inpatient Hospital Stay (HOSPITAL_COMMUNITY): Payer: Self-pay

## 2018-03-22 ENCOUNTER — Inpatient Hospital Stay (HOSPITAL_COMMUNITY): Payer: 59 | Admitting: Occupational Therapy

## 2018-03-22 ENCOUNTER — Inpatient Hospital Stay (HOSPITAL_COMMUNITY): Payer: 59

## 2018-03-22 LAB — GLUCOSE, CAPILLARY
Glucose-Capillary: 172 mg/dL — ABNORMAL HIGH (ref 70–99)
Glucose-Capillary: 90 mg/dL (ref 70–99)
Glucose-Capillary: 252 mg/dL — ABNORMAL HIGH (ref 70–99)
Glucose-Capillary: 126 mg/dL — ABNORMAL HIGH (ref 70–99)

## 2018-03-22 MED ORDER — CARVEDILOL 12.5 MG PO TABS
12.5000 mg | ORAL_TABLET | Freq: Two times a day (BID) | ORAL | Status: DC
  Administered 2018-03-22 – 2018-03-23 (×3): 12.5 mg via ORAL
  Filled 2018-03-22 (×3): qty 1

## 2018-03-22 NOTE — Plan of Care (Signed)
  Problem: RH BOWEL ELIMINATION Goal: RH STG MANAGE BOWEL WITH ASSISTANCE Description STG Manage Bowel with min. Assistance.  Outcome: Progressing Goal: RH STG MANAGE BOWEL W/MEDICATION W/ASSISTANCE Description STG Manage Bowel with Medication with min. Assistance.  Outcome: Progressing   Problem: RH SKIN INTEGRITY Goal: RH STG SKIN FREE OF INFECTION/BREAKDOWN Description With min. assist.  Outcome: Progressing Goal: RH STG MAINTAIN SKIN INTEGRITY WITH ASSISTANCE Description STG Maintain Skin Integrity With min.Assistance.  Outcome: Progressing   Problem: RH SAFETY Goal: RH STG ADHERE TO SAFETY PRECAUTIONS W/ASSISTANCE/DEVICE Description STG Adhere to Safety Precautions With min. Assistance/Device.  Outcome: Progressing   Problem: RH PAIN MANAGEMENT Goal: RH STG PAIN MANAGED AT OR BELOW PT'S PAIN GOAL Description Less than 3.  Outcome: Progressing   Problem: RH KNOWLEDGE DEFICIT Goal: RH STG INCREASE KNOWLEDGE OF DIABETES Outcome: Progressing Goal: RH STG INCREASE KNOWLEDGE OF HYPERTENSION Outcome: Progressing Goal: RH STG INCREASE KNOWLEGDE OF HYPERLIPIDEMIA Outcome: Progressing Goal: RH STG INCREASE KNOWLEDGE OF STROKE PROPHYLAXIS Outcome: Progressing   Problem: Consults Goal: RH STROKE PATIENT EDUCATION Description See Patient Education module for education specifics  Outcome: Progressing

## 2018-03-22 NOTE — Progress Notes (Signed)
Occupational Therapy Session Note  Patient Details  Name: [name] MRN: [id number] Date of Birth: May 08, 1965  Today's Date: 03/22/2018 OT Individual Time: 0904-1000 OT Individual Time Calculation (min): 56 min    Short Term Goals: Week 2:  OT Short Term Goal 1 (Week 2): STG = LTGs due to remaining LOS  Skilled Therapeutic Interventions/Progress Updates:    Treatment session with focus on dynamic standing balance, Lt attention, functional use of LUE, and organization of thought processes.  Pt received seated EOB, reporting already dressed and ready for session.  Pt ambulated to Dayroom with RW without assistance.  Engaged in [place] activity in standing with focus on learning and recall of novel activity to challenge organization and sequencing thought process.  Mod faded to no cues for matching and sequencing of task.  Min cues to utilize LUE when flipping and matching tiles.  Pt self-selected seated rest breaks throughout session.  Ambulated back to room with RW with supervision and left seated EOB awaiting next therapy session.  Pt reports no further questions and excited to d/c home tomorrow.  Therapy Documentation Precautions:  Precautions Precautions: Fall Restrictions Weight Bearing Restrictions: No General:   Vital Signs: Therapy Vitals Pulse Rate: 100 Pain: Pain Assessment Pain Scale: 0-10 Pain Score: 0-No pain   Therapy/Group: Individual Therapy  Rosalio LoudHOXIE, Eliud Polo 03/22/2018, 12:48 PM

## 2018-03-22 NOTE — Progress Notes (Signed)
Occupational Therapy Session Note  Patient Details  Name: [name] MRN: [id number] Date of Birth: 05-02-1965  Today's Date: 03/22/2018 OT Individual Time: 1015-1045 OT Individual Time Calculation (min): 30 min    Short Term Goals: Week 2:  OT Short Term Goal 1 (Week 2): STG = LTGs due to remaining LOS  Skilled Therapeutic Interventions/Progress Updates:    Pt sitting EOB upon arrival.  OT intervention with focus on functional amb with RW, standing balance, attention to left, BUE functional use, safety awareness, and activity tolerance to increase independence with BADLs.  Pt amb with RW to Day Room.  Pt recalled going to Day Room during earlier sessions and was able to locate Day Room independently.  Pt incorporated scanning strategies while ambulating to Day Room.  Pt engaged in pipe tree activities, colored peg board activities, and push pin activities with all items placed on her L.  Pt completed more complex activities independently. PT came to get pt for next session.   Therapy Documentation Precautions:  Precautions Precautions: Fall Restrictions Weight Bearing Restrictions: No Pain: Pain Assessment Pain Scale: 0-10 Pain Score: 0-No pain   Therapy/Group: Individual Therapy  Rich BraveLanier, [name] 03/22/2018, 10:57 AM

## 2018-03-22 NOTE — Progress Notes (Signed)
Occupational Therapy Discharge Summary  Patient Details  Name: [name] MRN: [id number] Date of Birth: 1966-03-05  Patient has met 12 of 12 long term goals due to improved balance, postural control, ability to compensate for deficits, functional use of  LEFT upper extremity, improved attention and improved awareness.  Patient to discharge at overall Supervision level.  Patient's care partner is independent to provide the necessary cognitive assistance at discharge.    Reasons goals not met: N/A  Recommendation:  Patient will benefit from ongoing skilled OT services in home health setting to continue to advance functional skills in the area of BADL and Reduce care partner burden.  Equipment: 3 in 1  Reasons for discharge: treatment goals met and discharge from hospital  Patient/family agrees with progress made and goals achieved: Yes  OT Discharge Precautions/Restrictions  Precautions Precautions: Fall Restrictions Weight Bearing Restrictions: No General   Vital Signs Therapy Vitals Pulse Rate: 92 Pain Pain Assessment Pain Scale: 0-10 Pain Score: 0-No pain ADL ADL Eating: Independent Grooming: Supervision/safety Where Assessed-Grooming: Standing at sink Upper Body Bathing: Supervision/safety Where Assessed-Upper Body Bathing: Shower Lower Body Bathing: Supervision/safety Where Assessed-Lower Body Bathing: Shower Upper Body Dressing: Supervision/safety Where Assessed-Upper Body Dressing: Edge of bed Lower Body Dressing: Supervision/safety Where Assessed-Lower Body Dressing: Edge of bed Toileting: Supervision/safety Where Assessed-Toileting: Glass blower/designer: Distant supervision Armed forces technical officer Method: Counselling psychologist: Bedside commode(RW) Social research officer, government: Close supervision Social research officer, government Method: Heritage manager: Civil engineer, contracting with back Vision Baseline Vision/History: Wears glasses Wears Glasses: At  all times Patient Visual Report: Blurring of vision Vision Assessment?: Yes Eye Alignment: Within Functional Limits Ocular Range of Motion: Restricted on the left Alignment/Gaze Preference: Within Defined Limits Tracking/Visual Pursuits: Requires cues, head turns, or add eye shifts to track Saccades: Additional head turns occurred during testing;Decreased speed of saccadic movement Perception  Perception: Impaired Inattention/Neglect: Does not attend to left side of body(mild) Praxis   Cognition Overall Cognitive Status: History of cognitive impairments - at baseline Arousal/Alertness: Awake/alert Orientation Level: Oriented X4 Attention: Sustained Sustained Attention: Appears intact Memory: Impaired Memory Impairment: Decreased recall of new information Comments: mild impulsivity, L inattention, and decreased safety awareness Sensation Sensation Light Touch: Impaired by gross assessment(mild impairment Lt compared to Rt) Proprioception: Appears Intact Coordination Gross Motor Movements are Fluid and Coordinated: No Fine Motor Movements are Fluid and Coordinated: No Finger Nose Finger Test: much improved from eval Motor  Motor Motor - Discharge Observations: mild left hemiparesis Mobility     Trunk/Postural Assessment     Balance   Extremity/Trunk Assessment RUE Assessment RUE Assessment: Within Functional Limits LUE Assessment LUE Assessment: Exceptions to [facility] General Strength Comments: grossly 4/5 throughout   [place], [facility] 03/22/2018, 7:15 PM

## 2018-03-22 NOTE — Plan of Care (Signed)
  Problem: RH BOWEL ELIMINATION Goal: RH STG MANAGE BOWEL WITH ASSISTANCE Description STG Manage Bowel with min. Assistance.  Outcome: Progressing Goal: RH STG MANAGE BOWEL W/MEDICATION W/ASSISTANCE Description STG Manage Bowel with Medication with min. Assistance.  Outcome: Progressing   Problem: RH BLADDER ELIMINATION Goal: RH STG MANAGE BLADDER WITH ASSISTANCE Description STG Manage Bladder With min.Assistance  Outcome: Progressing   Problem: RH SKIN INTEGRITY Goal: RH STG SKIN FREE OF INFECTION/BREAKDOWN Description With min. assist.  Outcome: Progressing Goal: RH STG MAINTAIN SKIN INTEGRITY WITH ASSISTANCE Description STG Maintain Skin Integrity With min.Assistance.  Outcome: Progressing   Problem: RH SAFETY Goal: RH STG ADHERE TO SAFETY PRECAUTIONS W/ASSISTANCE/DEVICE Description STG Adhere to Safety Precautions With min. Assistance/Device.  Outcome: Progressing   Problem: RH PAIN MANAGEMENT Goal: RH STG PAIN MANAGED AT OR BELOW PT'S PAIN GOAL Description Less than 3.  Outcome: Progressing   Problem: RH KNOWLEDGE DEFICIT Goal: RH STG INCREASE KNOWLEDGE OF DIABETES Outcome: Progressing Goal: RH STG INCREASE KNOWLEDGE OF HYPERTENSION Outcome: Progressing Goal: RH STG INCREASE KNOWLEGDE OF HYPERLIPIDEMIA Outcome: Progressing Goal: RH STG INCREASE KNOWLEDGE OF STROKE PROPHYLAXIS Outcome: Progressing   Problem: Consults Goal: RH STROKE PATIENT EDUCATION Description See Patient Education module for education specifics  Outcome: Progressing   

## 2018-03-22 NOTE — Progress Notes (Signed)
Occupational Therapy Session Note  Patient Details  Name: [name] MRN: [id number] Date of Birth: 1965-10-17  Today's Date: 03/22/2018 OT Individual Time: 1430-1500 OT Individual Time Calculation (min): 30 min    Short Term Goals: Week 2:  OT Short Term Goal 1 (Week 2): STG = LTGs due to remaining LOS  Skilled Therapeutic Interventions/Progress Updates:    Pt seen for OT session focusing on functional ambulation/activity tolerance, and cognition goals. Pt sitting upright in w/c upon arrival, declining pain and agreeable to tx session. Addressed cognitive goals by having pt remember 5 items and then go to hospital gift shop to locate. Pt able to immediately recall items following writing down items on piece of paper. She was then taken to hospital gift store total A in w/c for time and energy conservation. Pt able to independently recall all 5 items once in gift store and able to locate items with slightly increased time. She required min cuing throughout for awareness to environmental obstacles on L as well as RW management in functional context.  Pt able to ambulate from gift store back to hospital room with supervision and min cuing to recall pathway back. Pt returned to room at end of session, left seated in w/c with chair belt alarm donned, all needs in reach and husband present.   Therapy Documentation Precautions:  Precautions Precautions: Fall Restrictions Weight Bearing Restrictions: No Pain:   No/denies pain   Therapy/Group: Individual Therapy  Penn Grissett L 03/22/2018, 7:07 AM

## 2018-03-22 NOTE — Progress Notes (Signed)
[facility] PHYSICAL MEDICINE & REHABILITATION PROGRESS NOTE   Subjective/Complaints:  No issues overnite , no pains  ROS- denies CP, SOB, N/V/D  Objective:   No results found. No results for input(s): WBC, HGB, HCT, PLT in the last 72 hours. No results for input(s): NA, K, CL, CO2, GLUCOSE, BUN, CREATININE, CALCIUM in the last 72 hours.  Intake/Output Summary (Last 24 hours) at 03/22/2018 0830 Last data filed at 03/22/2018 0749 Gross per 24 hour  Intake 720 ml  Output -  Net 720 ml     Physical Exam: Vital Signs Blood pressure (!) 166/108, pulse 94, temperature 97.9 F (36.6 C), temperature source Oral, resp. rate 16, height 5\' 5"  (1.651 m), weight 100.8 kg, SpO2 94 %.  General: No acute distress Mood and affect are appropriate Heart: Regular rate and rhythm no rubs murmurs or extra sounds Lungs: Clear to auscultation, breathing unlabored, no rales or wheezes Abdomen: Positive bowel sounds, soft nontender to palpation, nondistended Extremities: No clubbing, cyanosis, or edema Skin: No evidence of breakdown, no evidence of rash Neurologic: Cranial nerves II through XII intact, motor strength is 5/5 in right and 4/5 left deltoid, bicep, tricep, grip, hip flexor, knee extensors, ankle dorsiflexor and plantar flexor Sensory exam normal sensation to light touch and proprioception in bilateral upper and lower extremities Cerebellar exam normal finger to nose to finger as well as heel to shin in bilateral upper and lower extremities Musculoskeletal: Full range of motion in all 4 extremities. No joint swelling    Assessment/Plan: 1. 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  which require 3+ hours per day of interdisciplinary therapy in a comprehensive inpatient rehab setting.  Physiatrist is providing close team supervision and 24 hour management of active medical problems listed below.  Physiatrist and rehab team continue to assess barriers to discharge/monitor  patient progress toward functional and medical goals  Care Tool:  Bathing  Bathing activity did not occur: Refused Body parts bathed by patient: Right arm, Left arm, Chest, Abdomen, Front perineal area, Right upper leg, Left upper leg, Face, Buttocks, Right lower leg, Left lower leg   Body parts bathed by helper: Right lower leg, Left lower leg, Buttocks     Bathing assist Assist Level: Supervision/Verbal cueing     Upper Body Dressing/Undressing Upper body dressing   What is the patient wearing?: Bra, Pull over shirt    Upper body assist Assist Level: Supervision/Verbal cueing    Lower Body Dressing/Undressing Lower body dressing      What is the patient wearing?: Underwear/pull up, Pants     Lower body assist Assist for lower body dressing: Supervision/Verbal cueing     Toileting Toileting    Toileting assist Assist for toileting: Supervision/Verbal cueing     Transfers Chair/bed transfer  Transfers assist  Chair/bed transfer activity did not occur: Safety/medical concerns  Chair/bed transfer assist level: Supervision/Verbal cueing     Locomotion Ambulation   Ambulation assist      Assist level: Supervision/Verbal cueing Assistive device: Walker-rolling Max distance: 150   Walk 10 feet activity   Assist     Assist level: Supervision/Verbal cueing Assistive device: Walker-rolling   Walk 50 feet activity   Assist    Assist level: Supervision/Verbal cueing Assistive device: Walker-rolling    Walk 150 feet activity   Assist Walk 150 feet activity did not occur: Safety/medical concerns  Assist level: Supervision/Verbal cueing Assistive device: Walker-rolling    Walk 10 feet on uneven surface  activity   Assist  Walk 10 feet on uneven surfaces activity did not occur: Safety/medical concerns   Assist level: Minimal Assistance - Patient > 75% Assistive device: Photographer Will patient use wheelchair  at discharge?: (tbd) Type of Wheelchair: Manual    Wheelchair assist level: Supervision/Verbal cueing Max wheelchair distance: 150'    Wheelchair 50 feet with 2 turns activity    Assist        Assist Level: Supervision/Verbal cueing   Wheelchair 150 feet activity     Assist Wheelchair 150 feet activity did not occur: Safety/medical concerns   Assist Level: Supervision/Verbal cueing    Medical Problem List and Plan: 1.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 DVT Prophylaxis/Anticoagulation: SCDs. 3. Pain Management:Cymbalta 60 mg daily 4. Mood:Seroquel 50 mg daily at bedtime, appears bright, she is on Seroquel chronically at home 5. Neuropsych: This patientiscapable of making decisions on herown behalf. 6. Skin/Wound Care:Routine skin checks 7. Fluids/Electrolytes/Nutrition:Routine and out's with follow-up chemistries 8. Seizure disorder. Keppra 500 mg twice a day 9.Diabetes mellitus with peripheral neuropathy. Hemoglobin A1c 12.9.Lantus insulin 30 units daily at bedtime. Check blood sugars before meals and at bedtime. Diabetic teaching CBG (last 3)  Recent Labs    03/21/18 1704 03/21/18 2242 03/22/18 0635  GLUCAP 196* 176* 172*  Controlled 12/2 Vitals:   03/21/18 1958 03/22/18 0525  BP: (!) 180/105 (!) 166/108  Pulse: 81 94  Resp: 18 16  Temp: 97.9 F (36.6 C) 97.9 F (36.6 C)  SpO2: 97% 94%  Elevated BP-increase amlodipine 10 mg/day,add coreg, HR should support home dose 25mg  will start  12.5 mg   BID10. CKD stage II. Follow-up chemistries BMET with mild elevation of BUN and creat over baseline, enc fluids, I yesterday, improved, will monitor  11. Hypertension. Permissive  hypertension. Patient on Norvasc 10 mg daily, Coreg 25 mg twice a day,hygroton 25 mg daily, hydralazine 50 mg 3 times a day, lisinopril 40 mg daily prior to admission. Resume as needed 12. Hyperlipidemia. Lipitor  13.  Chronic constipation increase senna S to 2 po BID  LOS: 11 days A FACE TO FACE EVALUATION WAS PERFORMED  Erick Colace 03/22/2018, 8:30 AM

## 2018-03-23 LAB — GLUCOSE, CAPILLARY
Glucose-Capillary: 86 mg/dL (ref 70–99)
Glucose-Capillary: 149 mg/dL — ABNORMAL HIGH (ref 70–99)

## 2018-03-23 MED ORDER — ACETAMINOPHEN 325 MG PO TABS: 650.0000 mg | ORAL_TABLET | Freq: Four times a day (QID) | ORAL | Status: AC | PRN

## 2018-03-23 MED ORDER — LEVETIRACETAM 500 MG PO TABS: 500.0000 mg | ORAL_TABLET | Freq: Two times a day (BID) | ORAL | 0 refills | Status: DC

## 2018-03-23 MED ORDER — HYDRALAZINE HCL 50 MG PO TABS: 50.0000 mg | ORAL_TABLET | Freq: Three times a day (TID) | ORAL | 1 refills | Status: DC

## 2018-03-23 MED ORDER — INSULIN GLARGINE 100 UNIT/ML SOLOSTAR PEN: 34.0000 [IU] | PEN_INJECTOR | Freq: Every day | SUBCUTANEOUS | 11 refills | Status: AC

## 2018-03-23 MED ORDER — AMLODIPINE BESYLATE 10 MG PO TABS: 10.0000 mg | ORAL_TABLET | Freq: Every day | ORAL | 1 refills | Status: AC

## 2018-03-23 MED ORDER — CARVEDILOL 25 MG PO TABS: 25.0000 mg | ORAL_TABLET | Freq: Two times a day (BID) | ORAL | Status: DC

## 2018-03-23 MED ORDER — CARVEDILOL 12.5 MG PO TABS: 12.5000 mg | ORAL_TABLET | Freq: Two times a day (BID) | ORAL | 1 refills | Status: DC

## 2018-03-23 MED ORDER — ATORVASTATIN CALCIUM 80 MG PO TABS: 80.0000 mg | ORAL_TABLET | Freq: Every day | ORAL | 0 refills | Status: AC

## 2018-03-23 MED ORDER — CARVEDILOL 25 MG PO TABS: 25.0000 mg | ORAL_TABLET | Freq: Two times a day (BID) | ORAL | 1 refills | Status: AC

## 2018-03-23 MED ORDER — QUETIAPINE FUMARATE 50 MG PO TABS: 50.0000 mg | ORAL_TABLET | Freq: Every day | ORAL | 1 refills | Status: AC

## 2018-03-23 MED ORDER — CLOPIDOGREL BISULFATE 75 MG PO TABS: 75.0000 mg | ORAL_TABLET | Freq: Every day | ORAL | 0 refills | Status: DC

## 2018-03-23 MED ORDER — DULOXETINE HCL 60 MG PO CPEP: 60.0000 mg | ORAL_CAPSULE | Freq: Every day | ORAL | 3 refills | Status: AC

## 2018-03-23 NOTE — Progress Notes (Signed)
Pt's reported blood pressure 170/107, pt asymptomatic, pt refuses recheck by staff. Dan Angiulli-PA on unit and made aware, will followup.

## 2018-03-23 NOTE — Progress Notes (Signed)
[place] PHYSICAL MEDICINE & REHABILITATION PROGRESS NOTE   Subjective/Complaints:  No issues overnite , no pains  ROS- denies CP, SOB, N/V/D  Objective:   No results found. No results for input(s): WBC, HGB, HCT, PLT in the last 72 hours. No results for input(s): NA, K, CL, CO2, GLUCOSE, BUN, CREATININE, CALCIUM in the last 72 hours.  Intake/Output Summary (Last 24 hours) at 03/23/2018 0939 Last data filed at 03/23/2018 0700 Gross per 24 hour  Intake 1140 ml  Output -  Net 1140 ml     Physical Exam: Vital Signs Blood pressure (!) 170/107, pulse 77, temperature 97.8 F (36.6 C), temperature source Oral, resp. rate 18, height 5\' 5"  (1.651 m), weight 101 kg, SpO2 100 %.  General: No acute distress Mood and affect are appropriate Heart: Regular rate and rhythm no rubs murmurs or extra sounds Lungs: Clear to auscultation, breathing unlabored, no rales or wheezes Abdomen: Positive bowel sounds, soft nontender to palpation, nondistended Extremities: No clubbing, cyanosis, or edema Skin: No evidence of breakdown, no evidence of rash Neurologic: Cranial nerves II through XII intact, motor strength is 5/5 in right and 4/5 left deltoid, bicep, tricep, grip, hip flexor, knee extensors, ankle dorsiflexor and plantar flexor Sensory exam normal sensation to light touch and proprioception in bilateral upper and lower extremities Cerebellar exam normal finger to nose to finger as well as heel to shin in bilateral upper and lower extremities Musculoskeletal: Full range of motion in all 4 extremities. No joint swelling    Assessment/Plan: 1. 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   Stable for D/C today F/u PCP in 3-4 weeks F/u PM&R 2 weeks See D/C summary See D/C instructions Care Tool:  Bathing  Bathing activity did not occur: Refused Body parts bathed by patient: Right arm, Left arm, Chest, Abdomen, Front perineal area, Right upper leg, Left upper leg, Face,  Buttocks, Right lower leg, Left lower leg   Body parts bathed by helper: Right lower leg, Left lower leg, Buttocks     Bathing assist Assist Level: Supervision/Verbal cueing     Upper Body Dressing/Undressing Upper body dressing   What is the patient wearing?: Bra, Pull over shirt    Upper body assist Assist Level: Supervision/Verbal cueing    Lower Body Dressing/Undressing Lower body dressing      What is the patient wearing?: Underwear/pull up, Pants     Lower body assist Assist for lower body dressing: Supervision/Verbal cueing     Toileting Toileting    Toileting assist Assist for toileting: Supervision/Verbal cueing     Transfers Chair/bed transfer  Transfers assist  Chair/bed transfer activity did not occur: Safety/medical concerns  Chair/bed transfer assist level: Supervision/Verbal cueing     Locomotion Ambulation   Ambulation assist      Assist level: Supervision/Verbal cueing Assistive device: Walker-rolling Max distance: 150   Walk 10 feet activity   Assist     Assist level: Supervision/Verbal cueing Assistive device: Walker-rolling   Walk 50 feet activity   Assist    Assist level: Supervision/Verbal cueing Assistive device: Walker-rolling    Walk 150 feet activity   Assist Walk 150 feet activity did not occur: Safety/medical concerns  Assist level: Supervision/Verbal cueing Assistive device: Walker-rolling    Walk 10 feet on uneven surface  activity   Assist Walk 10 feet on uneven surfaces activity did not occur: Safety/medical concerns   Assist level: Contact Guard/Touching assist Assistive device: Photographer Will  patient use wheelchair at discharge?: No Type of Wheelchair: Manual    Wheelchair assist level: Supervision/Verbal cueing Max wheelchair distance: 150'    Wheelchair 50 feet with 2 turns activity    Assist        Assist Level: Supervision/Verbal cueing    Wheelchair 150 feet activity     Assist Wheelchair 150 feet activity did not occur: Safety/medical concerns   Assist Level: Supervision/Verbal cueing    Medical Problem List and Plan: 1.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 DVT Prophylaxis/Anticoagulation: SCDs. 3. Pain Management:Cymbalta 60 mg daily 4. Mood:Seroquel 50 mg daily at bedtime, appears bright, she is on Seroquel chronically at home 5. Neuropsych: This patientiscapable of making decisions on herown behalf. 6. Skin/Wound Care:Routine skin checks 7. Fluids/Electrolytes/Nutrition:Routine and out's with follow-up chemistries 8. Seizure disorder. Keppra 500 mg twice a day 9.Diabetes mellitus with peripheral neuropathy. Hemoglobin A1c 12.9.Lantus insulin 30 units daily at bedtime. Check blood sugars before meals and at bedtime. Diabetic teaching CBG (last 3)  Recent Labs    03/22/18 1613 03/22/18 2117 03/23/18 0626  GLUCAP 252* 126* 86  Controlled 12/4 Vitals:   03/22/18 2004 03/23/18 0520  BP: (!) 160/98 (!) 170/107  Pulse: 93 77  Resp: 15 18  Temp: 98.1 F (36.7 C) 97.8 F (36.6 C)  SpO2: 96% 100%  Elevated BP-increase amlodipine 10 mg/day,add coreg, HR should support home dose 25mg  will start  12.5 mg   BID- increase to 25mg   10. CKD stage II. Follow-up chemistries BMET with mild elevation of BUN and creat over baseline, enc fluids, I 702ml yesterday, improved, will monitor  11. Hypertension. Permissive hypertension. Patient on Norvasc 10 mg daily, Coreg 25 mg twice a day,hygroton 25 mg daily, hydralazine 50 mg 3 times a day, lisinopril 40 mg daily prior to admission. Resume as needed 12. Hyperlipidemia. Lipitor   13.  Chronic constipation increase senna S to 2 po BID  LOS: 12 days A FACE TO FACE EVALUATION WAS PERFORMED  Erick Colacendrew E Ajayla Iglesias 03/23/2018, 9:39 AM

## 2018-03-23 NOTE — Plan of Care (Signed)
  Problem: RH BOWEL ELIMINATION Goal: RH STG MANAGE BOWEL WITH ASSISTANCE Description STG Manage Bowel with min. Assistance.  Outcome: Completed/Met Goal: RH STG MANAGE BOWEL W/MEDICATION W/ASSISTANCE Description STG Manage Bowel with Medication with min. Assistance.  Outcome: Completed/Met   Problem: RH BLADDER ELIMINATION Goal: RH STG MANAGE BLADDER WITH ASSISTANCE Description STG Manage Bladder With min.Assistance  Outcome: Completed/Met   Problem: RH SKIN INTEGRITY Goal: RH STG SKIN FREE OF INFECTION/BREAKDOWN Description With min. assist.  Outcome: Completed/Met Goal: RH STG MAINTAIN SKIN INTEGRITY WITH ASSISTANCE Description STG Maintain Skin Integrity With min.Assistance.  Outcome: Completed/Met   Problem: RH SAFETY Goal: RH STG ADHERE TO SAFETY PRECAUTIONS W/ASSISTANCE/DEVICE Description STG Adhere to Safety Precautions With min. Assistance/Device.  Outcome: Completed/Met   Problem: RH PAIN MANAGEMENT Goal: RH STG PAIN MANAGED AT OR BELOW PT'S PAIN GOAL Description Less than 3.  Outcome: Completed/Met   Problem: RH KNOWLEDGE DEFICIT Goal: RH STG INCREASE KNOWLEDGE OF DIABETES Outcome: Completed/Met Goal: RH STG INCREASE KNOWLEDGE OF HYPERTENSION Outcome: Completed/Met Goal: RH STG INCREASE KNOWLEGDE OF HYPERLIPIDEMIA Outcome: Completed/Met Goal: RH STG INCREASE KNOWLEDGE OF STROKE PROPHYLAXIS Outcome: Completed/Met   Problem: Consults Goal: RH STROKE PATIENT EDUCATION Description See Patient Education module for education specifics  Outcome: Completed/Met

## 2018-03-23 NOTE — Progress Notes (Signed)
Patient discharge home accompanied by husband. Discharge instructions given by [name] Pa. Patient taken down via wheelchair. [name]

## 2018-03-24 ENCOUNTER — Telehealth: Payer: Self-pay | Admitting: Registered Nurse

## 2018-03-24 NOTE — Telephone Encounter (Signed)
Transitional care Call made, no answer. Left message to return the call.  1st attempt.

## 2018-03-24 NOTE — Telephone Encounter (Signed)
[facility] He stated decline OT services because she could not afford to pay for OT and PT services.Pt has Nurse, learning disabilitycommercial insurance and has to pay 25.00 dollars. [facility] stated pt only wanted PT to come one a week for 4 weeks because of the copayment. RN gave verbal order per Dr.Sethi. Rn also request that pt has nursing care to find her a PCP.Rn its recommend pt finds a PCP to monitor blood pressure and cholesterol other issues. [name] verbalized understanding.

## 2018-03-25 NOTE — Telephone Encounter (Signed)
Transitional Care call  Patient name: [name] DOB: 03-26-1966 1. Are you/is patient experiencing any problems since coming home? No a. Are there any questions regarding any aspect of care? No 2. Are there any questions regarding medications administration/dosing? No a. Are meds being taken as prescribed? Yes b. "Patient should review meds with caller to confirm"  Medication List Reviewed 3. Have there been any falls?No 4. Has Home Health been to the house and/or have they contacted you? Yes, [facility]  a. If not, have you tried to contact them? NA b. Can we help you contact them?NA 5. Are bowels and bladder emptying properly? Yes a. Are there any unexpected incontinence issues? NA b. If applicable, is patient following bowel/bladder programs? NA 6. Any fevers, problems with breathing, unexpected pain? No 7. Are there any skin problems or new areas of breakdown? No 8. Has the patient/family member arranged specialty MD follow up (ie cardiology/neurology/renal/surgical/etc.)?  [name] has a scheduled appointment with Neurology, also reports Dr. Marina GoodellPerry is not her PCP. [facility] [place], she was instructed to call for appointment.  a. Can we help arrange? NA 9. Does the patient need any other services or support that we can help arrange? No 10. Are caregivers following through as expected in assisting the patient? Yes 11. Has the patient quit smoking, drinking alcohol, or using drugs as recommended? (                        )  Appointment date/time 04/05/2018  arrival time 2:20 for 2:40 appointment   with Jessica BalesEunice L [name] ANP, at [address]

## 2018-04-05 ENCOUNTER — Encounter: Payer: Self-pay | Admitting: Registered Nurse

## 2018-04-05 ENCOUNTER — Encounter: Payer: 59 | Attending: Registered Nurse | Admitting: Registered Nurse

## 2018-04-05 ENCOUNTER — Encounter: Payer: Self-pay | Admitting: Adult Health

## 2018-04-05 VITALS — BP 137/83 | HR 84 | Ht 65.0 in | Wt 228.0 lb

## 2018-04-05 DIAGNOSIS — I10 Essential (primary) hypertension: Secondary | ICD-10-CM

## 2018-04-05 DIAGNOSIS — Z9049 Acquired absence of other specified parts of digestive tract: Secondary | ICD-10-CM | POA: Insufficient documentation

## 2018-04-05 DIAGNOSIS — E1122 Type 2 diabetes mellitus with diabetic chronic kidney disease: Secondary | ICD-10-CM | POA: Insufficient documentation

## 2018-04-05 DIAGNOSIS — I6330 Cerebral infarction due to thrombosis of unspecified cerebral artery: Secondary | ICD-10-CM

## 2018-04-05 DIAGNOSIS — I129 Hypertensive chronic kidney disease with stage 1 through stage 4 chronic kidney disease, or unspecified chronic kidney disease: Secondary | ICD-10-CM | POA: Insufficient documentation

## 2018-04-05 DIAGNOSIS — Z8673 Personal history of transient ischemic attack (TIA), and cerebral infarction without residual deficits: Secondary | ICD-10-CM | POA: Diagnosis not present

## 2018-04-05 DIAGNOSIS — N182 Chronic kidney disease, stage 2 (mild): Secondary | ICD-10-CM | POA: Diagnosis not present

## 2018-04-05 DIAGNOSIS — G4730 Sleep apnea, unspecified: Secondary | ICD-10-CM | POA: Insufficient documentation

## 2018-04-05 DIAGNOSIS — R569 Unspecified convulsions: Secondary | ICD-10-CM | POA: Insufficient documentation

## 2018-04-05 DIAGNOSIS — Z90710 Acquired absence of both cervix and uterus: Secondary | ICD-10-CM | POA: Diagnosis not present

## 2018-04-05 DIAGNOSIS — E785 Hyperlipidemia, unspecified: Secondary | ICD-10-CM | POA: Insufficient documentation

## 2018-04-05 NOTE — Progress Notes (Signed)
Subjective:    Patient ID: [name], female    DOB: [birth year], 52 y.o.   MRN: [id number]  HPI: [name] is a 52 y.o. female who is here for Transitional Care Visit in follow up of her Right cerebral artery infarction, history of stroke, hypertension, dyslipidemia, seizures and CKD  Stage 2. She has been home with Home Health Therapies with [facility]. She denies pain and reports good appetite.   When this provider entered the examination room , upon introducing myself and the reason for the visit.  [name] states she is being charged a Chiropodist and wants to see the doctor. I explained when the Transitional call was made she was instructed she would be seeing the nurse practitioner, she stated " she was not informed. I expained to [name] this provider made the TC call and this was discussed on the phone, she became argumentative . I explain this provider will not argue with her.  She became very angry and argumentative with this provider. I explained we can cancel her appointment and reschedule her to see Dr. Wynn Banker, she refused. States I want you to know  " I'm very Mad", I went to see if Wadie Lessen Conservator, museum/gallery) was available to speak with [name], she was at a meeting . I explained to [name] the manager was at a meeting. [name] also stated she will not be coming for her next scheduled appointment with Dr. Wynn Banker. This provider will let Dr. Wynn Banker know, she verbalizes understanding. Also began complaining about seeing Dr. Pearlean Brownie nurse practitioner as well.   Pain Inventory Average Pain 0 Pain Right Now 0 My pain is dull  In the last 24 hours, has pain interfered with the following? General activity 5 Relation with others 3 Enjoyment of life 2 What TIME of day is your pain at its worst? morning Sleep (in general) Poor  Pain is worse with: inactivity Pain improves with: therapy/exercise, pacing activities and medication Relief from Meds:  8  Mobility walk with assistance ability to climb steps?  yes do you drive?  no  Function employed # of hrs/week 32  Neuro/Psych bladder control problems weakness numbness  Prior Studies Any changes since last visit?  no  Physicians involved in your care Any changes since last visit?  no   Family History  Problem Relation Age of Onset  . Diabetes Neg Hx    Social History   Socioeconomic History  . Marital status: Married    Spouse name: Not on file  . Number of children: Not on file  . Years of education: Not on file  . Highest education level: Not on file  Occupational History  . Not on file  Social Needs  . Financial resource strain: Not hard at all  . Food insecurity:    Worry: Never true    Inability: Never true  . Transportation needs:    Medical: No    Non-medical: No  Tobacco Use  . Smoking status: Never Smoker  . Smokeless tobacco: Never Used  Substance and Sexual Activity  . Alcohol use: No    Alcohol/week: 0.0 standard drinks  . Drug use: No  . Sexual activity: Never  Lifestyle  . Physical activity:    Days per week: Patient refused    Minutes per session: Patient refused  . Stress: Rather much  Relationships  . Social connections:    Talks on phone: More than three times a week  Gets together: More than three times a week    Attends religious service: More than 4 times per year    Active member of club or organization: No    Attends meetings of clubs or organizations: Never    Relationship status: Married  Other Topics Concern  . Not on file  Social History Narrative  . Not on file   Past Surgical History:  Procedure Laterality Date  . ABDOMINAL HYSTERECTOMY    . CHOLECYSTECTOMY    . IR ANGIO INTRA EXTRACRAN SEL COM CAROTID INNOMINATE BILAT MOD SED  03/09/2018  . IR ANGIO VERTEBRAL SEL SUBCLAVIAN INNOMINATE UNI R MOD SED  03/09/2018  . IR ANGIO VERTEBRAL SEL VERTEBRAL UNI L MOD SED  03/09/2018  . IR US GUIDE VASC ACCESS RIGHT   03/09/2018  . ROTATOR CUFF REPAIR Right   . SHOULDER SURGERY Left    Past Medical History:  Diagnosis Date  . Anxiety   . Chronic kidney disease   . Diabetes mellitus without complication (HCC)   . Fibromyalgia   . Headache   . History of kidney stones   . Hyperlipemia   . Hypertension   . Hypothyroidism   . Insomnia   . Pancreatitis    chronic  . Sleep apnea   . Stroke (cerebrum) (HCC)   . Thrombotic cerebral infarction (HCC) 03/11/2018   BP 137/83   Pulse 84   Ht 5\' 5"  (1.651 m)   Wt 228 lb (103.4 kg)   SpO2 97%   BMI 37.94 kg/m   Opioid Risk Score:   Fall Risk Score:  `1  Depression screen PHQ 2/9  No flowsheet data found.   Review of Systems  Constitutional: Negative.   HENT: Negative.   Eyes: Negative.   Respiratory: Positive for apnea.   Cardiovascular: Negative.   Gastrointestinal: Positive for constipation.  Endocrine: Negative.   Genitourinary: Positive for difficulty urinating.  Musculoskeletal: Negative.   Skin: Negative.   Allergic/Immunologic: Negative.   Neurological: Positive for weakness and numbness.  Hematological: Bruises/bleeds easily.  Psychiatric/Behavioral: Negative.   All other systems reviewed and are negative.      Objective:   Physical Exam Constitutional:      Appearance: Normal appearance.  Neck:     Musculoskeletal: Normal range of motion and neck supple.  Cardiovascular:     Rate and Rhythm: Normal rate and regular rhythm.     Pulses: Normal pulses.     Heart sounds: Normal heart sounds.  Pulmonary:     Effort: Pulmonary effort is normal.     Breath sounds: Normal breath sounds.  Musculoskeletal:     Comments: Normal Muscle Bulk and Muscle Testing Reveals:  Upper Extremities: Full ROM and Muscle Strength 5/5  Lower Extremities: Full ROM and Muscle Strength  Arises from Table with ease Normal Based Gait   Skin:    General: Skin is warm and dry.  Neurological:     Mental Status: She is alert and oriented to  person, place, and time.  Psychiatric:     Comments: Argumentative           Assessment & Plan:  1. Cerebral Infarction due to thrombosis of cerebral artery/ History of Stroke:  Continue Home Health Therapies with [facility]. Has a scheduled appointment with Neurology.  2. Hypertension: Continue current medication regimen. PCP Following.  3. Dyslipidemia: Continue current medication regimen. PCP Following.  4.Seizures: Continue current medication regimen and Neurology Following  5. CKD Stage 2: PCP Following.  20  minutes of face to face patient care time was spent during this visit. All questions were encouraged and answered.  F/U in 4-6 weeks with Dr. Wynn BankerKirsteins.

## 2018-04-14 ENCOUNTER — Telehealth: Payer: Self-pay | Admitting: Adult Health

## 2018-04-14 NOTE — Telephone Encounter (Signed)
PT [facility]rista [facility] asking for orders to do reevaluation next week of pt.  Please call Dot LanesKrista @ [phone]

## 2018-04-14 NOTE — Telephone Encounter (Signed)
I attempted to call Dot LanesKrista back was unable to get in contact with her. I was also unable to leave a voicemail due to her mailbox being full. I will attempt to contact later on.

## 2018-04-15 NOTE — Telephone Encounter (Signed)
Unable to get in contact with [name] due to her voicemail being full.

## 2018-04-18 NOTE — Telephone Encounter (Signed)
Okay to give VO for PT.

## 2018-04-18 NOTE — Telephone Encounter (Addendum)
Rn call Dot LanesKrista that per Dr. Huston FoleySaima Athar verbal order for physical therapy.Rn stated she is the work in am doctor. Rn stated Dr.SEthi will be unable to sign the order because he did not give the order. RN stated Dr Frances FurbishAthar will sign the order she gave for therapy discharge.[facility]anesKrista [facility].

## 2018-04-18 NOTE — Telephone Encounter (Signed)
Rn call [name] to give verbal order. Her vm was full.

## 2018-04-18 NOTE — Telephone Encounter (Signed)
Rn receive call from [place] that verbal orders are needed for physical therapy for patient. She needs just a verbal order for one day or PT to discharge patient. PT cancel last week on her due to the holidays. Rn stated Dr.Sethi is on vacation till first of the year. Rn stated it will have to be forward to the work in doctor. Rn also stated JEssica NP has not seen the pt yet. Krista left number of [phone].

## 2018-04-26 ENCOUNTER — Ambulatory Visit (INDEPENDENT_AMBULATORY_CARE_PROVIDER_SITE_OTHER): Payer: Managed Care, Other (non HMO) | Admitting: Adult Health

## 2018-04-26 ENCOUNTER — Encounter: Payer: Self-pay | Admitting: Adult Health

## 2018-04-26 VITALS — BP 136/77 | HR 79 | Ht 65.0 in | Wt 233.8 lb

## 2018-04-26 DIAGNOSIS — R569 Unspecified convulsions: Secondary | ICD-10-CM

## 2018-04-26 DIAGNOSIS — I69322 Dysarthria following cerebral infarction: Secondary | ICD-10-CM

## 2018-04-26 DIAGNOSIS — E1142 Type 2 diabetes mellitus with diabetic polyneuropathy: Secondary | ICD-10-CM | POA: Diagnosis not present

## 2018-04-26 DIAGNOSIS — Z794 Long term (current) use of insulin: Secondary | ICD-10-CM

## 2018-04-26 DIAGNOSIS — I63312 Cerebral infarction due to thrombosis of left middle cerebral artery: Secondary | ICD-10-CM

## 2018-04-26 DIAGNOSIS — I10 Essential (primary) hypertension: Secondary | ICD-10-CM

## 2018-04-26 DIAGNOSIS — E785 Hyperlipidemia, unspecified: Secondary | ICD-10-CM

## 2018-04-26 DIAGNOSIS — I69819 Unspecified symptoms and signs involving cognitive functions following other cerebrovascular disease: Secondary | ICD-10-CM

## 2018-04-26 MED ORDER — LEVETIRACETAM 500 MG PO TABS: 500.0000 mg | ORAL_TABLET | Freq: Two times a day (BID) | ORAL | 7 refills | Status: DC

## 2018-04-26 NOTE — Progress Notes (Signed)
[facility] [address] [place]. [place] [address] [phone]       OFFICE FOLLOW UP NOTE  [name] Date of Birth:  Jul 30, 1965 Medical Record Number:  [id number]   Reason for Referral:  hospital stroke follow up  CHIEF COMPLAINT:  Chief Complaint  Patient presents with  . 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 History obtained from patient, daughter and chart review. Reviewed all radiology images and labs personally.  [name] is a 53 y.o. female with history of previous stroke non complaint with meds, HTN, HLD, DB, hypothyroidism, obesity who presented to [facility] with left-sided weakness that started on 02/28/2018.  Per review of notes, patient reported significant headache initially and then would have intermittent episodes of left arm and leg abnormal movements with subjective "jerking or flopping" which would last approximately 10 to 15 minutes before subsiding on their own.  She then experienced increased weakness on her left side which was previously chronically affected from a prior infarct.  MRI brain which was obtained at [facility] showed left ACA territory versus ACA/MCA watershed territory infarct.  CTA revealed multifocal intracranial arthrosclerotic disease.  A TEE was recommended and was transferred to [facility] at that time.  TEE was attempted but unfortunately aborted as patient unable to tolerate.  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  Recommended DAPT for 3 months due to intracranial atherosclerosis and then recommended continuing on Plavix alone.  It was not recommended to repeat TEE as it was felt her infarct was due to large vessel disease.  Per review of  notes, it appears as though patient had recently discontinued majority of her medications as she felt as though she was taking too many.  EEG negative but was placed on Keppra at [facility] due to possible seizure activity.  LDL unable to calculate due to elevated triglycerides.  Patient previously on atorvastatin 20 mg and recommended increasing to 80 mg daily.  A1c 13.7 and recommended tight glycemic control with close PCP follow-up for DM management.  Initially found to be in hypertensive urgency with stabilization throughout admission and recommended long-term BP goal normotensive range.  History of OSA and advise CPAP compliance.  Therapies originally recommended SNF placement but this patient declined, she was discharged home with recommendations of home health therapies. Unfortunately, patient returned to [facility] on 03/08/2018 with worsening of left lower extremity weakness and per notes, CT showed worsening of recent ACA infarct along with new patchy involvement of right corpus callosum.  Patient was transferred to [facility] for IR evaluation with cerebral angiogram on 03/09/2018.  Angiogram showed occluded right anterior cerebral artery approximately a 2 level with partial reconstitution at the territory distally from collaterals arising from that perisylvian branches, high-grade stenosis of the left MCA mid M1 segment, 50% stenosis of the left MCA, high-grade severe stenosis of the dominant inferior division of the right MCA proximally, 65 to 70% stenosis at the distal basilar artery and diffuse mild to moderate intracranial arthrosclerosis involving the anterior circulations bilaterally.  Due to partial reconstitution of the right ACA distribution distal to the A2 occlusion, was recommended to continue with DAPT and to follow-up in clinic outpatient in 3  months time.  Patient was discharged to CIR due to continued deficits in stable condition. Patient is being seen today for hospital  follow-up and is accompanied by her daughter.  She states overall she has been recovering well with only mild left-sided weakness and numbness, stuttering and speech occasionally and mild cognitive deficits.  She does state that she does have left paresthesias which are greater in her left hand and endorses pain in her left hand which worsens with any type of touch or stimulation.  She does state that she has had some cognitive deficits prior to her stroke with only slight worsening now but does believe this has been improving since hospital discharge.  She does state at times she experiences slurring and stuttering of her speech greater when she is trying to speak too quickly or does not plan out what she wants to say.  She denies receiving any kind of outpatient speech therapy.  She has recently completed home PT/OT but is not currently participating in any outpatient programs.  She has not returned to work at this time where she works at a [employer] as well as she has not returned to driving as she has been waiting for this appointment for clearance.  She continues on DAPT without side effects of bleeding or bruising.  Continues on atorvastatin without side effects myalgias.  Blood pressure today 136/77.  She does continue to monitor at home and typical SBP 1 20-1 30.  She does monitor glucose levels at home 3 times daily which have been ranging from 200-400.  She was seen by her PCP this morning who referred her to endocrinology for further management.  She continues to be compliant with CPAP for OSA management.  She does endorse compliance with all prescribed medications at this time.  She denies any recurrent seizure activity and continues on Keppra 500 mg twice daily without side effects.  No further concerns at this time.  Denies new or worsening stroke/TIA symptoms.     ROS:   14 system review of systems performed and negative with exception of fatigue, snoring, memory loss, numbness, slurred  speech, seizure, depression, anxiety, sleepiness and snoring  PMH:  Past Medical History:  Diagnosis Date  . Anxiety   . Chronic kidney disease   . Diabetes mellitus without complication (HCC)   . Fibromyalgia   . Headache   . History of kidney stones   . Hyperlipemia   . Hypertension   . Hypothyroidism   . Insomnia   . Pancreatitis    chronic  . Sleep apnea   . Stroke (cerebrum) (HCC)   . Thrombotic cerebral infarction (HCC) 03/11/2018    PSH:  Past Surgical History:  Procedure Laterality Date  . ABDOMINAL HYSTERECTOMY    . CHOLECYSTECTOMY    . IR ANGIO INTRA EXTRACRAN SEL COM CAROTID INNOMINATE BILAT MOD SED  03/09/2018  . IR ANGIO VERTEBRAL SEL SUBCLAVIAN INNOMINATE UNI R MOD SED  03/09/2018  . IR ANGIO VERTEBRAL SEL VERTEBRAL UNI L MOD SED  03/09/2018  . IR US GUIDE VASC ACCESS RIGHT  03/09/2018  . ROTATOR CUFF REPAIR Right   . SHOULDER SURGERY Left     Social History:  Social History   Socioeconomic History  . Marital status: Married    Spouse name: Not on file  . Number of children: Not on file  . Years of education: Not on file  . Highest education level: Not on file  Occupational History  . Not on  file  Social Needs  . Financial resource strain: Not hard at all  . Food insecurity:    Worry: Never true    Inability: Never true  . Transportation needs:    Medical: No    Non-medical: No  Tobacco Use  . Smoking status: Never Smoker  . Smokeless tobacco: Never Used  Substance and Sexual Activity  . Alcohol use: No    Alcohol/week: 0.0 standard drinks  . Drug use: No  . Sexual activity: Never  Lifestyle  . Physical activity:    Days per week: Patient refused    Minutes per session: Patient refused  . Stress: Rather much  Relationships  . Social connections:    Talks on phone: More than three times a week    Gets together: More than three times a week    Attends religious service: More than 4 times per year    Active member of club or  organization: No    Attends meetings of clubs or organizations: Never    Relationship status: Married  . Intimate partner violence:    Fear of current or ex partner: No    Emotionally abused: No    Physically abused: No    Forced sexual activity: No  Other Topics Concern  . Not on file  Social History Narrative  . Not on file    Family History:  Family History  Problem Relation Age of Onset  . Diabetes Neg Hx     Medications:   Current Outpatient Medications on File Prior to Visit  Medication Sig Dispense Refill  . acetaminophen (TYLENOL) 325 MG tablet Take 2 tablets (650 mg total) by mouth every 6 (six) hours as needed for mild pain (or Fever >/= 101).    Marland Kitchen amLODipine (NORVASC) 10 MG tablet Take 1 tablet (10 mg total) by mouth daily. 30 tablet 1  . aspirin EC 81 MG EC tablet Take 1 tablet (81 mg total) by mouth daily. 30 tablet 0  . atorvastatin (LIPITOR) 80 MG tablet Take 1 tablet (80 mg total) by mouth at bedtime. 30 tablet 0  . carvedilol (COREG) 25 MG tablet Take 1 tablet (25 mg total) by mouth 2 (two) times daily with a meal. 60 tablet 1  . clopidogrel (PLAVIX) 75 MG tablet Take 1 tablet (75 mg total) by mouth daily. 30 tablet 11  . clopidogrel (PLAVIX) 75 MG tablet Take 1 tablet (75 mg total) by mouth daily. 30 tablet 0  . DULoxetine (CYMBALTA) 60 MG capsule Take 1 capsule (60 mg total) by mouth daily. 30 capsule 3  . Insulin Glargine (LANTUS SOLOSTAR) 100 UNIT/ML Solostar Pen Inject 34 Units into the skin at bedtime. (Patient taking differently: Inject 36 Units into the skin at bedtime. ) 15 mL 11  . QUEtiapine (SEROQUEL) 50 MG tablet Take 1 tablet (50 mg total) by mouth at bedtime. 30 tablet 1   No current facility-administered medications on file prior to visit.     Allergies:  No Known Allergies   Physical Exam  Vitals:   04/26/18 1528  BP: 136/77  Pulse: 79  Weight: 233 lb 12.8 oz (106.1 kg)  Height: 5\' 5"  (1.651 m)   Body mass index is 38.91 kg/m. No  exam data present  General: well developed, well nourished, pleasant middle-aged Caucasian female, seated, in no evident distress Head: head normocephalic and atraumatic.   Neck: supple with no carotid or supraclavicular bruits Cardiovascular: regular rate and rhythm, no murmurs Musculoskeletal: no deformity Skin:  no rash/petichiae Vascular:  Normal pulses all extremities  Neurologic Exam Mental Status: Awake and fully alert. Oriented to place and time. Recent and remote memory intact with subjective intermittent difficulties. Attention span, concentration and fund of knowledge appropriate.  Flat affect but cooperative with exam and assessment. Cranial Nerves: Fundoscopic exam reveals sharp disc margins. Pupils equal, briskly reactive to light. Extraocular movements full without nystagmus. Visual fields full to confrontation. Hearing intact. Facial sensation intact. Face, tongue, palate moves normally and symmetrically.  Motor: Normal bulk and tone. Normal strength in all tested extremity muscles Sensory.:  Unable to assess sensation due to increased pain with any type of left upper or lower stimulation or touch Coordination: Rapid alternating movements normal on right side and decreased left hand finger dexterity. Finger-to-nose and heel-to-shin performed accurately bilaterally. Gait and Station: Arises from chair without difficulty. Stance is normal. Gait demonstrates normal stride length and balance. Able to heel, toe and tandem walk without difficulty.  Reflexes: 1+ and symmetric. Toes downgoing.    NIHSS  1 Modified Rankin  2    Diagnostic Data (Labs, Imaging, Testing)  CEREBRAL ANGIOGRAM 03/09/2018 IMPRESSION: Angiographically occluded right anterior cerebral artery approximately A2 level. Partial reconstitution of the territory distally from collaterals arising from the perisylvian branches.  High-grade stenosis of the left middle cerebral artery mid  M1 segment.  Approximately 50% stenosis of the left middle cerebral artery at its origin.  High-grade severe stenosis of the dominant inferior division of the right middle cerebral artery proximally.  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  Diffuse mild to moderate intracranial arteriosclerosis involving the anterior circulations bilaterally as described above.     ASSESSMENT: Jessica Breedingancy Dreisbach is a 53 y.o. year old female here with right ACA infarct on 03/02/2018 secondary to large vessel disease.  Patient did experience worsening of her right ACA infarct on 03/08/2018.  Vascular risk factors include uncontrolled HTN, HLD, DM and intracranial stenosis.  Patient is being seen today for hospital follow-up and does continue to have residual deficits of left paresthesias, dysarthria and cognitive deficit post stroke.    PLAN:  1. Right ACA infarct: Continue aspirin 325 mg daily and clopidogrel 75 mg daily  and atorvastatin for secondary stroke prevention. Maintain strict control of hypertension with blood pressure goal below 130/90, diabetes with hemoglobin A1c goal below 6.5% and cholesterol with LDL cholesterol (bad cholesterol) goal below 70 mg/dL.  I also advised the patient to eat a healthy diet with plenty of whole grains, cereals, fruits and vegetables, exercise regularly with at least 30 minutes of continuous activity daily and maintain ideal body weight. 2. Post stroke deficits: Referral placed for outpatient OT/ST for continued deficits.  Advised her not to start driving at this time until she is further evaluated by Occupational Therapy as concern for delayed reaction time or difficulty with multitasking 3. Seizures: Continue Keppra 500 mg twice daily at this time.  We can consider weaning at follow-up appointment 4. Intracranial stenosis: Follow-up with vascular surgery as recommended for surveillance monitoring 5. HTN: Advised to continue current treatment  regimen.  Today's BP 134/77.  Advised to continue to monitor at home along with continued follow-up with PCP for management 6. HLD: Advised to continue current treatment regimen along with continued follow-up with PCP for future prescribing and monitoring of lipid panel 7. DMII: Advised to continue to monitor glucose levels at home along with continued follow-up with PCP/endocrinologist for management and monitoring    Follow up in 3 months  or call earlier if needed   Greater than 50% of time during this 25 minute visit was spent on counseling, explanation of diagnosis of right ACA infarct, reviewing risk factor management of HTN, HLD, DM, intracranial stenosis and seizures, planning of further management along with potential future management, and discussion with patient and family answering all questions.    [name], AGNP-BC  [facility] [address] [place], [place] [address]  Phone [phone] Fax [phone] Note: This document was prepared with digital dictation and possible smart phrase technology. Any transcriptional errors that result from this process are unintentional.

## 2018-04-26 NOTE — Patient Instructions (Signed)
Continue aspirin 325 mg daily and clopidogrel 75 mg daily  and Lipitor for secondary stroke prevention  Continue to follow up with PCP regarding blood pressure, diabetes and cholesterol management   Follow-up with vascular surgery as recommended at hospital discharge -they typically will call you to schedule this appointment - if you dont hear from vascular surgery by mid February, please call office to schedule  Continue keppra 500mg  twice a day  Continue to monitor blood pressure at home  Maintain strict control of hypertension with blood pressure goal below 130/90, diabetes with hemoglobin A1c goal below 6.5% and cholesterol with LDL cholesterol (bad cholesterol) goal below 70 mg/dL. I also advised the patient to eat a healthy diet with plenty of whole grains, cereals, fruits and vegetables, exercise regularly and maintain ideal body weight.  Followup in the future with me in 3 months or call earlier if needed       Thank you for coming to see [place] at [facility]. I hope we have been able to provide you high quality care today.  You may receive a patient satisfaction survey over the next few weeks. We would appreciate your feedback and comments so that we may continue to improve ourselves and the health of our patients.

## 2018-05-06 NOTE — Progress Notes (Signed)
I agree with the above plan 

## 2018-05-23 ENCOUNTER — Telehealth: Payer: Self-pay

## 2018-05-23 ENCOUNTER — Encounter (HOSPITAL_COMMUNITY): Payer: Self-pay | Admitting: Interventional Radiology

## 2018-05-23 NOTE — Telephone Encounter (Signed)
Received a fax from [facility] for a plan of care that needed to be signed by the provider. Shanda Bumps NP has signed and I have faxed it back to [facility] at [phone]. Confirmation fax has been received.

## 2018-05-25 ENCOUNTER — Encounter: Payer: Self-pay | Admitting: Adult Health

## 2018-05-26 ENCOUNTER — Encounter: Payer: Self-pay | Admitting: Adult Health

## 2018-05-30 ENCOUNTER — Encounter: Payer: Self-pay | Admitting: *Deleted

## 2018-06-02 NOTE — Telephone Encounter (Signed)
Spoke to pt and no fax # available for [place] to use.  Will email to [email or web address].  I spoke to [employer], employer.  I emailed to them [employer].

## 2018-08-02 ENCOUNTER — Telehealth: Payer: Self-pay

## 2018-08-02 NOTE — Telephone Encounter (Signed)
Tried to call pt about office visit change to video visit due to COVID 19. I called listed number twice and when it ring it goes to a busy signal. Will send pt a mychart message.

## 2018-08-08 ENCOUNTER — Ambulatory Visit: Payer: Managed Care, Other (non HMO) | Admitting: Adult Health

## 2018-08-08 NOTE — Telephone Encounter (Signed)
Attempted to call the patient to convert her office visit into a webex visit with Shanda Bumps. Her phone rung twice then I received the busy signal.

## 2018-12-29 ENCOUNTER — Ambulatory Visit (INDEPENDENT_AMBULATORY_CARE_PROVIDER_SITE_OTHER): Payer: Managed Care, Other (non HMO) | Admitting: Adult Health

## 2018-12-29 ENCOUNTER — Other Ambulatory Visit: Payer: Self-pay

## 2018-12-29 ENCOUNTER — Encounter: Payer: Self-pay | Admitting: Adult Health

## 2018-12-29 VITALS — Temp 97.7°F | Ht 65.0 in | Wt 269.2 lb

## 2018-12-29 DIAGNOSIS — R569 Unspecified convulsions: Secondary | ICD-10-CM | POA: Diagnosis not present

## 2018-12-29 DIAGNOSIS — I10 Essential (primary) hypertension: Secondary | ICD-10-CM | POA: Diagnosis not present

## 2018-12-29 DIAGNOSIS — E1142 Type 2 diabetes mellitus with diabetic polyneuropathy: Secondary | ICD-10-CM

## 2018-12-29 DIAGNOSIS — I63312 Cerebral infarction due to thrombosis of left middle cerebral artery: Secondary | ICD-10-CM | POA: Diagnosis not present

## 2018-12-29 DIAGNOSIS — E785 Hyperlipidemia, unspecified: Secondary | ICD-10-CM

## 2018-12-29 DIAGNOSIS — Z794 Long term (current) use of insulin: Secondary | ICD-10-CM

## 2018-12-29 MED ORDER — LEVETIRACETAM 250 MG PO TABS: ORAL_TABLET | ORAL | 3 refills | Status: AC

## 2018-12-29 MED ORDER — CLOPIDOGREL BISULFATE 75 MG PO TABS: 75.0000 mg | ORAL_TABLET | Freq: Every day | ORAL | 3 refills | Status: AC

## 2018-12-29 NOTE — Patient Instructions (Signed)
Keppra taper: 250 mg a.m. ; 500 mg p.m.  = continue this regimen for 4 weeks 250 mg a.m.; 250 mg p.m. = continue this regimen for 8 weeks 250 mg p.m  = continue for 4 weeks  At that time, recommend follow back up in our office with repeat EEG. As discussed during visit, you are at risk of recurrent seizure activity while decreasing dose in attempt to discontinue.  Please call office with any seizure type activity with possible need of returning to original dose  Continue clopidogrel 75 mg daily  and lipitor  for secondary stroke prevention  Stop aspirin at this time and continue on plavix alone  Continue to follow up with PCP regarding cholesterol, blood pressure and diabetes management   Continue to monitor blood pressure at home  Follow up is needed with Dr. Estanislado Pandy  Please call her office to schedule visit if you do not hear from them by mid next week [phone]  Continue to do your exercises at home as recommended during therapy sessions and call office when you able to proceed with additional therapy sessions  Maintain strict control of hypertension with blood pressure goal below 130/90, diabetes with hemoglobin A1c goal below 6.5% and cholesterol with LDL cholesterol (bad cholesterol) goal below 70 mg/dL. I also advised the patient to eat a healthy diet with plenty of whole grains, cereals, fruits and vegetables, exercise regularly and maintain ideal body weight.  Followup in the future with me in 4 months or call earlier if needed       Thank you for coming to see [place] at [facility]. I hope we have been able to provide you high quality care today.  You may receive a patient satisfaction survey over the next few weeks. We would appreciate your feedback and comments so that we may continue to improve ourselves and the health of our patients.

## 2018-12-29 NOTE — Progress Notes (Signed)
[facility] [address] [place]. [place] [address] [phone]       OFFICE FOLLOW UP NOTE  [name] Date of Birth:  09-13-1965 Medical Record Number:  [id number]   Reason for Referral:  hospital stroke follow up  CHIEF COMPLAINT:  Chief Complaint  Patient presents with   Follow-up    8 mon f/u. Alone. Rm 9. She would like to discuss coming off her keppra.     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 History obtained from patient, daughter and chart review. Reviewed all radiology images and labs personally.  [name] is a 53 y.o. female with history of previous stroke non complaint with meds, HTN, HLD, DB, hypothyroidism, obesity who presented to [facility] with left-sided weakness that started on 02/28/2018.  Per review of notes, patient reported significant headache initially and then would have intermittent episodes of left arm and leg abnormal movements with subjective "jerking or flopping" which would last approximately 10 to 15 minutes before subsiding on their own.  She then experienced increased weakness on her left side which was previously chronically affected from a prior infarct.  MRI brain which was obtained at [facility] showed left ACA territory versus ACA/MCA watershed territory infarct.  CTA revealed multifocal intracranial arthrosclerotic disease.  A TEE was recommended and was transferred to [facility] at that time.  TEE was attempted but unfortunately aborted as patient unable to tolerate.  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  Recommended DAPT for 3 months due to intracranial atherosclerosis and then recommended continuing on Plavix alone.  It was not recommended to repeat TEE as it was felt her infarct was due to large vessel disease.  Per review of notes, it appears as though patient had recently  discontinued majority of her medications as she felt as though she was taking too many.  EEG negative but was placed on Keppra at [facility] due to possible seizure activity.  LDL unable to calculate due to elevated triglycerides.  Patient previously on atorvastatin 20 mg and recommended increasing to 80 mg daily.  A1c 13.7 and recommended tight glycemic control with close PCP follow-up for DM management.  Initially found to be in hypertensive urgency with stabilization throughout admission and recommended long-term BP goal normotensive range.  History of OSA and advise CPAP compliance.  Therapies originally recommended SNF placement but this patient declined, she was discharged home with recommendations of home health therapies. Unfortunately, patient returned to [facility] on 03/08/2018 with worsening of left lower extremity weakness and per notes, CT showed worsening of recent ACA infarct along with new patchy involvement of right corpus callosum.  Patient was transferred to [facility] for IR evaluation with cerebral angiogram on 03/09/2018.  Angiogram showed occluded right anterior cerebral artery approximately a 2 level with partial reconstitution at the territory distally from collaterals arising from that perisylvian branches, high-grade stenosis of the left MCA mid M1 segment, 50% stenosis of the left MCA, high-grade severe stenosis of the dominant inferior division of the right MCA proximally, 65 to 70% stenosis at the distal basilar artery and diffuse mild to moderate intracranial arthrosclerosis involving the anterior circulations bilaterally.  Due to partial reconstitution of the right ACA distribution distal to the A2 occlusion, was recommended to continue with DAPT and to follow-up in clinic outpatient in 3 months time.  Patient was discharged to  [facility] due to continued deficits in stable condition. Patient is being seen today for hospital follow-up and is accompanied by her daughter.  She states  overall she has been recovering well with only mild left-sided weakness and numbness, stuttering and speech occasionally and mild cognitive deficits.  She does state that she does have left paresthesias which are greater in her left hand and endorses pain in her left hand which worsens with any type of touch or stimulation.  She does state that she has had some cognitive deficits prior to her stroke with only slight worsening now but does believe this has been improving since hospital discharge.  She does state at times she experiences slurring and stuttering of her speech greater when she is trying to speak too quickly or does not plan out what she wants to say.  She denies receiving any kind of outpatient speech therapy.  She has recently completed home PT/OT but is not currently participating in any outpatient programs.  She has not returned to work at this time where she works at a [employer] as well as she has not returned to driving as she has been waiting for this appointment for clearance.  She continues on DAPT without side effects of bleeding or bruising.  Continues on atorvastatin without side effects myalgias.  Blood pressure today 136/77.  She does continue to monitor at home and typical SBP 1 20-1 30.  She does monitor glucose levels at home 3 times daily which have been ranging from 200-400.  She was seen by her PCP this morning who referred her to endocrinology for further management.  She continues to be compliant with CPAP for OSA management.  She does endorse compliance with all prescribed medications at this time.  She denies any recurrent seizure activity and continues on Keppra 500 mg twice daily without side effects.  No further concerns at this time.  Denies new or worsening stroke/TIA symptoms.  Update 12/29/18:  [name] is being seen today for stroke follow-up.  She continues to have residual left-sided weakness, numbness and mild speech difficulties.  She did participate in  outpatient therapies at her apartment but denies any improvement with therapy.  She did return to work without difficulty.  Her greatest frustration is difficulty with coordination and difficulty functioning with her left hand but is able to maintain ADLs and IADLs despite difficulty.  She is unable to participate in any additional therapy sessions due to insurance.  She continues to follow with PCP for HTN, HLD and DM management.  Recent A1c significantly improved over 18-month period from 12 to 8.3.  She has continued on aspirin 81 mg and Plavix without bleeding or bruising.  She has not followed up with Dr. Estanislado Pandy as recommended during hospitalization for surveillance monitoring of carotid stenosis.  She continues on atorvastatin without myalgias.  Unable to obtain blood pressure today due to patient complaining of increased pain with cuff pressure.  She also endorses 1 week onset of left leg pain which she feels as though it could be contributed to a pinched nerve.  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    PSH:  Past Surgical History:  Procedure Laterality Date   ABDOMINAL HYSTERECTOMY     CHOLECYSTECTOMY     IR ANGIO INTRA EXTRACRAN SEL COM CAROTID INNOMINATE BILAT MOD SED  03/09/2018   IR ANGIO VERTEBRAL SEL SUBCLAVIAN INNOMINATE UNI R MOD SED  03/09/2018   IR ANGIO VERTEBRAL SEL VERTEBRAL UNI L MOD SED   03/09/2018   IR US GUIDE VASC ACCESS RIGHT  03/09/2018   ROTATOR CUFF REPAIR Right    SHOULDER SURGERY Left     Social History:  Social History   Socioeconomic History   Marital status: Married    Spouse name: Not on file   Number of children: Not on file   Years of education: Not on file   Highest education level: Not on file  Occupational History   Not on file  Social Needs   Financial resource strain: Not hard at all   Food insecurity    Worry: Never true    Inability: Never true   Transportation needs    Medical: No    Non-medical: No  Tobacco Use   Smoking status: Never Smoker   Smokeless tobacco: Never Used  Substance and Sexual Activity   Alcohol use: No    Alcohol/week: 0.0 standard drinks   Drug use: No   Sexual activity: Never  Lifestyle   Physical activity    Days per week: Patient refused    Minutes per session: Patient refused   Stress: Rather much  Relationships   Social connections    Talks on phone: More than three times a week    Gets together: More than three times a week    Attends religious service: More than 4 times per year    Active member of club or organization: No    Attends meetings of clubs or organizations: Never    Relationship status: Married   Intimate partner violence    Fear of current or ex partner: No    Emotionally abused: No    Physically abused: No    Forced sexual activity: No  Other Topics Concern   Not on file  Social History Narrative   Not on file    Family History:  Family History  Problem Relation Age of Onset   Diabetes Neg Hx     Medications:   Current Outpatient Medications on File Prior to Visit  Medication Sig Dispense Refill   acetaminophen (TYLENOL) 325 MG tablet Take 2 tablets (650 mg total) by mouth every 6 (six) hours as needed for mild pain (or Fever >/= 101).     amLODipine (NORVASC) 10 MG tablet Take 1 tablet (10 mg total) by mouth daily. 30 tablet 1   aspirin EC 81  MG EC tablet Take 1 tablet (81 mg total) by mouth daily. 30 tablet 0   atorvastatin (LIPITOR) 80 MG tablet Take 1 tablet (80 mg total) by mouth at bedtime. 30 tablet 0   carvedilol (COREG) 25 MG tablet Take 1 tablet (25 mg total) by mouth 2 (two) times daily with a meal. 60 tablet 1   clopidogrel (PLAVIX) 75 MG tablet Take 1 tablet (75 mg total) by mouth daily. 30 tablet 0  DULoxetine (CYMBALTA) 60 MG capsule Take 1 capsule (60 mg total) by mouth daily. 30 capsule 3   levETIRAcetam (KEPPRA) 500 MG tablet Take 1 tablet (500 mg total) by mouth 2 (two) times daily. 60 tablet 7   QUEtiapine (SEROQUEL) 50 MG tablet Take 1 tablet (50 mg total) by mouth at bedtime. 30 tablet 1   clopidogrel (PLAVIX) 75 MG tablet Take 1 tablet (75 mg total) by mouth daily. (Patient not taking: Reported on 12/29/2018) 30 tablet 11   Insulin Glargine (LANTUS SOLOSTAR) 100 UNIT/ML Solostar Pen Inject 34 Units into the skin at bedtime. (Patient not taking: Reported on 12/29/2018) 15 mL 11   No current facility-administered medications on file prior to visit.     Allergies:  No Known Allergies   Physical Exam  Vitals:   12/29/18 1234  Temp: 97.7 F (36.5 C)  TempSrc: Oral  Weight: 269 lb 3.2 oz (122.1 kg)  Height: 5\' 5"  (1.651 m)  *Patient refused BP*  Body mass index is 44.8 kg/m. No exam data present  General: well developed, well nourished, pleasant middle-aged Caucasian female, seated, in no evident distress Head: head normocephalic and atraumatic.   Neck: supple with no carotid or supraclavicular bruits Cardiovascular: regular rate and rhythm, no murmurs Musculoskeletal: no deformity Skin:  no rash/petichiae Vascular:  Normal pulses all extremities  Neurologic Exam Mental Status: Awake and fully alert.  Mild dysarthria.  Oriented to place and time. Recent and remote memory intact with subjective intermittent difficulties. Attention span, concentration and fund of knowledge appropriate.  Flat  affect but cooperative with exam and assessment. Cranial Nerves: Pupils equal, briskly reactive to light. Extraocular movements full without nystagmus. Visual fields full to confrontation. Hearing intact. Facial sensation intact. Face, tongue, palate moves normally and symmetrically.  Motor: Normal bulk and tone.  Possible mild left upper and lower extremity weakness but consistent giveaway weakness with patient grimacing with muscle testing due to pain Sensory.:  Diminished light touch sensation left upper and lower extremity Coordination: Rapid alternating movements slightly decreased on left side.  Finger-to-nose and heel-to-shin performed accurately on right side with mild dysmetria on left side. Gait and Station: Arises from chair without difficulty. Stance is normal. Gait demonstrates favoring of left leg but is able to ambulate without assistive device or evidence of imbalance Reflexes: 1+ and symmetric. Toes downgoing.        Diagnostic Data (Labs, Imaging, Testing)  CEREBRAL ANGIOGRAM 03/09/2018 IMPRESSION: Angiographically occluded right anterior cerebral artery approximately A2 level. Partial reconstitution of the territory distally from collaterals arising from the perisylvian branches.  High-grade stenosis of the left middle cerebral artery mid M1 segment.  Approximately 50% stenosis of the left middle cerebral artery at its origin.  High-grade severe stenosis of the dominant inferior division of the right middle cerebral artery proximally.  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  Diffuse mild to moderate intracranial arteriosclerosis involving the anterior circulations bilaterally as described above.     ASSESSMENT: [name] is a 53 y.o. year old female here with right ACA infarct on 03/02/2018 secondary to large vessel disease.  Patient did experience worsening of her right ACA infarct on 03/08/2018.  Vascular risk factors include  uncontrolled HTN, HLD, DM and intracranial stenosis.  Residual left hemisensory deficit, decreased LUE coordination and dysarthria    PLAN:  1. Right ACA infarct: Continue clopidogrel 75 mg daily  and atorvastatin for secondary stroke prevention.  Advised to discontinue aspirin at this time as no indication for  long-term DAPT.  Maintain strict control of hypertension with blood pressure goal below 130/90, diabetes with hemoglobin A1c goal below 6.5% and cholesterol with LDL cholesterol (bad cholesterol) goal below 70 mg/dL.  I also advised the patient to eat a healthy diet with plenty of whole grains, cereals, fruits and vegetables, exercise regularly with at least 30 minutes of continuous activity daily and maintain ideal body weight. 2. Post stroke deficits: Highly encouraged ongoing exercises at home as recommended during therapy sessions.  Per patient, unable to participate in additional therapy due to insurance 3. Possible seizures: Patient questioning discontinuing AED.  Long discussion regarding potential recurrent seizures during tapering, once discontinued or in the future which he verbalized understanding.  Will slowly decrease Keppra dosage over 961-month period with tapering instructions provided.  It would then be recommended to obtain EEG to ensure no abnormality 4. Intracranial stenosis: Recommended surveillance monitoring with vascular surgery during hospitalization for intracranial stenosis.  Provided patient with vascular surgery office number to call and schedule visit. 5. HTN: Advised to continue current treatment regimen.  Advised to continue to monitor at home along with continued follow-up with PCP for management 6. HLD: Advised to continue current treatment regimen along with continued follow-up with PCP for future prescribing and monitoring of lipid panel 7. DMII: Advised to continue to monitor glucose levels at home along with continued follow-up with PCP/endocrinologist for  management and monitoring    Follow up in 4 months or call earlier if needed   Greater than 50% of time during this 25 minute visit was spent on counseling, explanation of diagnosis of right ACA infarct, reviewing risk factor management of HTN, HLD, DM, intracranial stenosis and seizures, discussion of potential recurrent seizures and discontinuing AED per patient request, planning of further management along with potential future management, and discussion with patient and family answering all questions.    Ihor AustinJessica McCue, AGNP-BC  [facility] [address] [place] [id number]  Phone [phone] Fax [phone] Note: This document was prepared with digital dictation and possible smart phrase technology. Any transcriptional errors that result from this process are unintentional.

## 2018-12-29 NOTE — Progress Notes (Signed)
I agree with the above plan 

## 2019-02-08 ENCOUNTER — Telehealth: Payer: Self-pay | Admitting: Adult Health

## 2019-02-08 NOTE — Telephone Encounter (Signed)
Pt husband called to report pt passed on 2019/03/01

## 2019-02-19 DEATH — deceased

## 2019-05-01 ENCOUNTER — Ambulatory Visit: Payer: Managed Care, Other (non HMO) | Admitting: Adult Health

## 2019-12-19 IMAGING — US US RENAL
1 series · 14 of 25 positions shown · non-contrast
Comparison: CT abdomen and pelvis 01/17/2014

CLINICAL DATA: Chronic kidney disease. History of kidney stones.
Stroke, hypertension, diabetes.

EXAM:
RENAL / URINARY TRACT ULTRASOUND COMPLETE

[Series 1: us renal · 0.24mm/px · 14 of 41 slices shown]
[im 1/41]
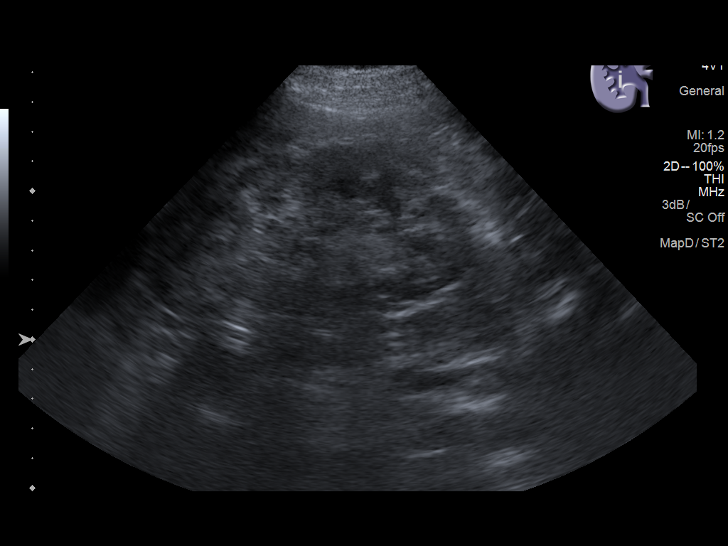
[im 4/41]
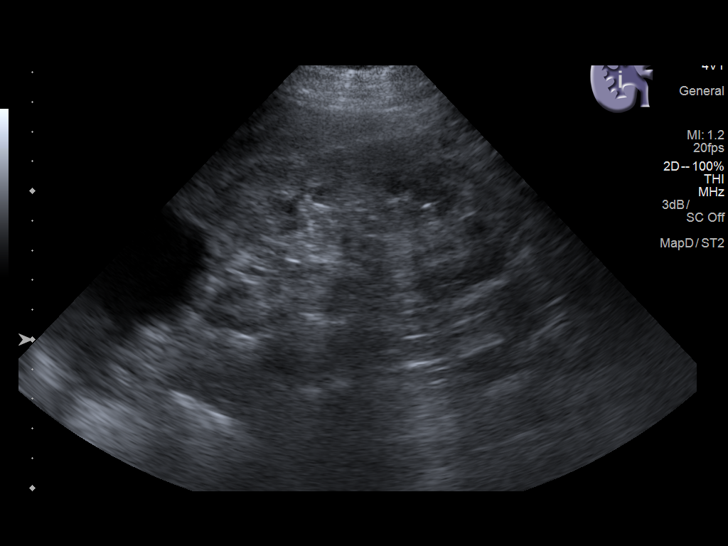
[im 7/41]
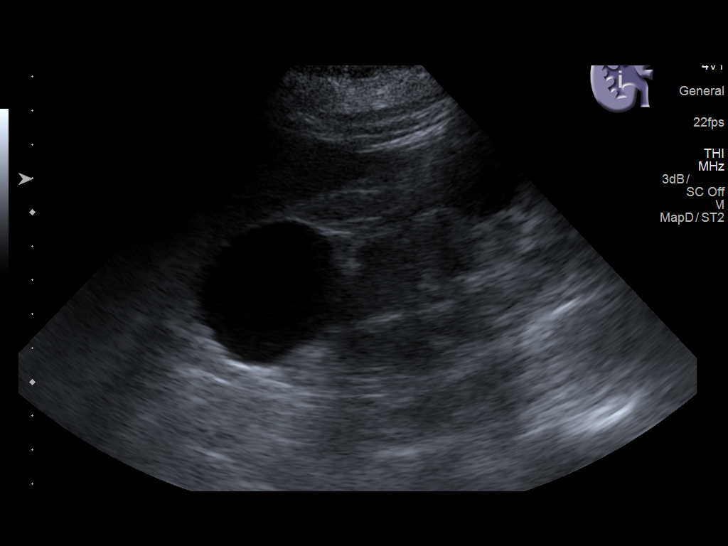
[im 11/41]
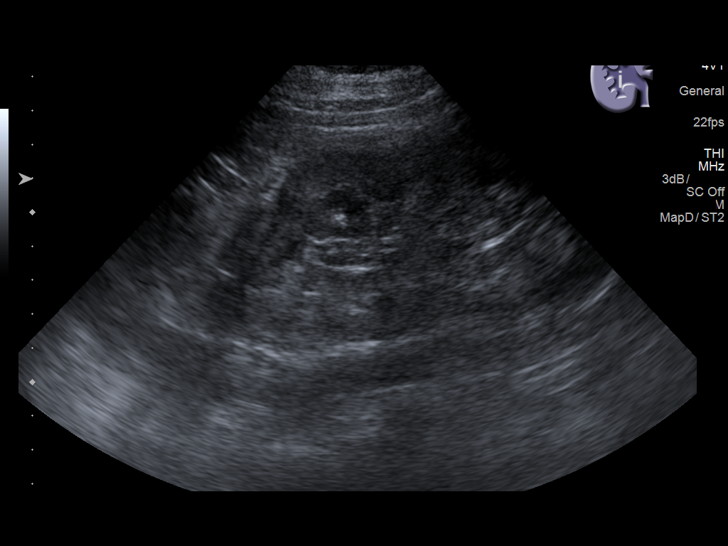
[im 14/41]
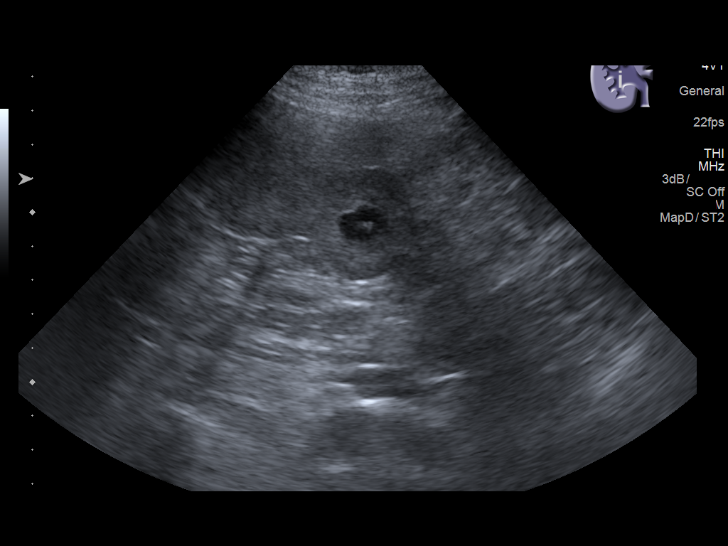
[im 16/41]
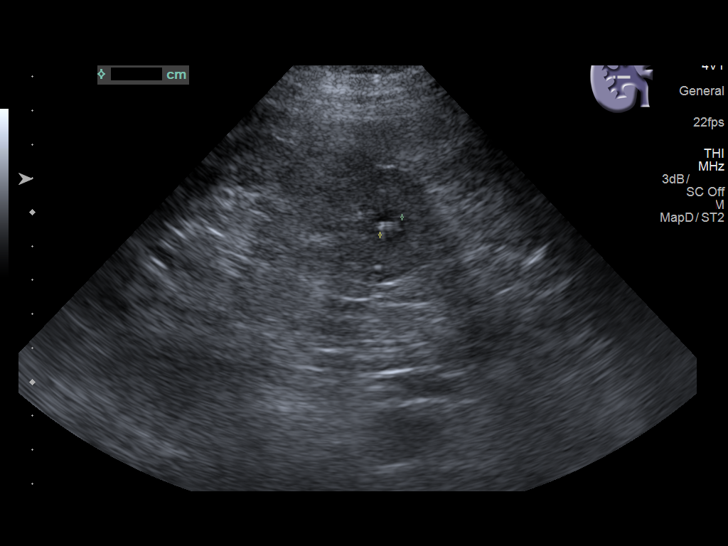
[im 19/41]
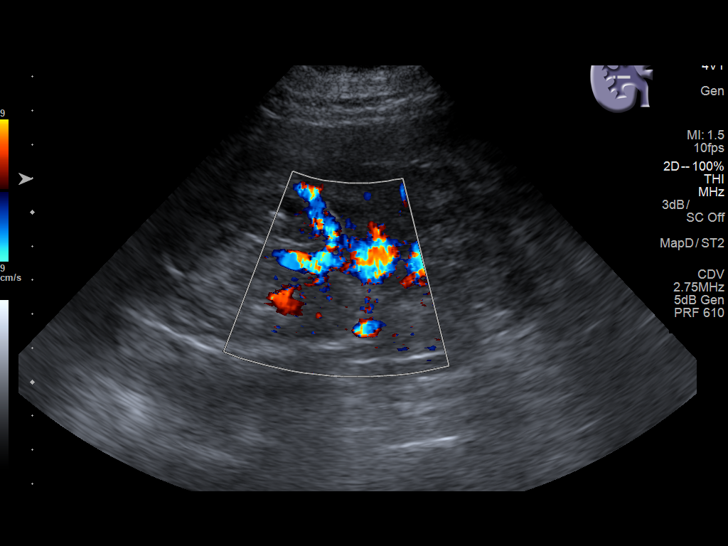
[im 22/41]
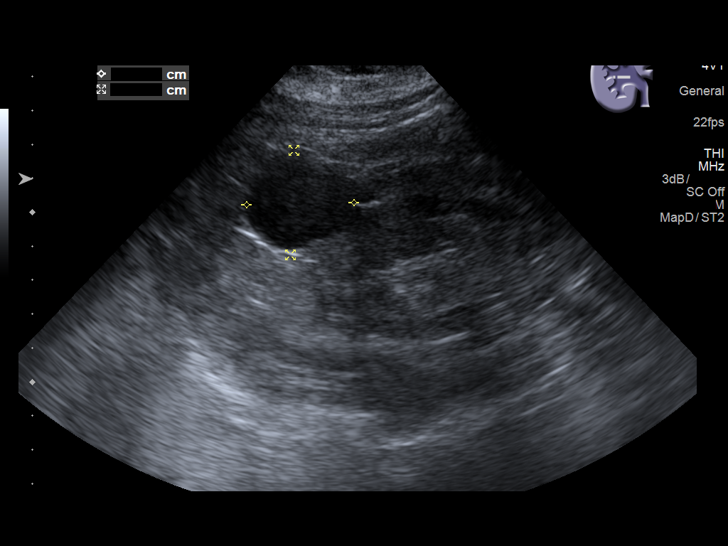
[im 26/41]
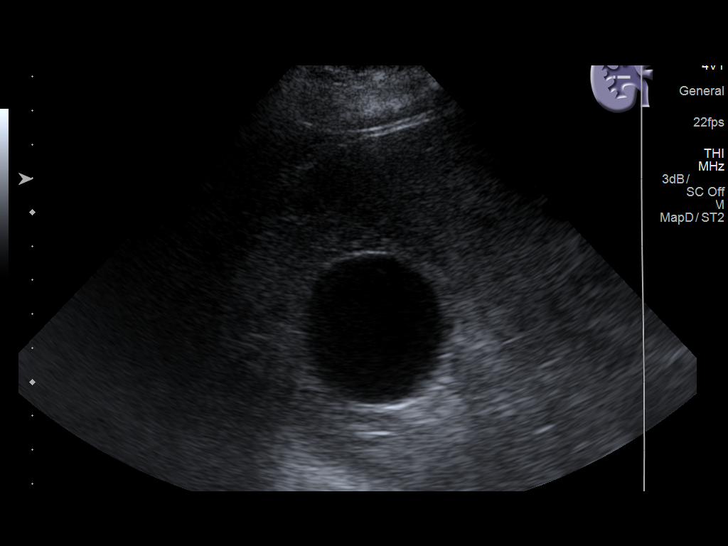
[im 27/41]
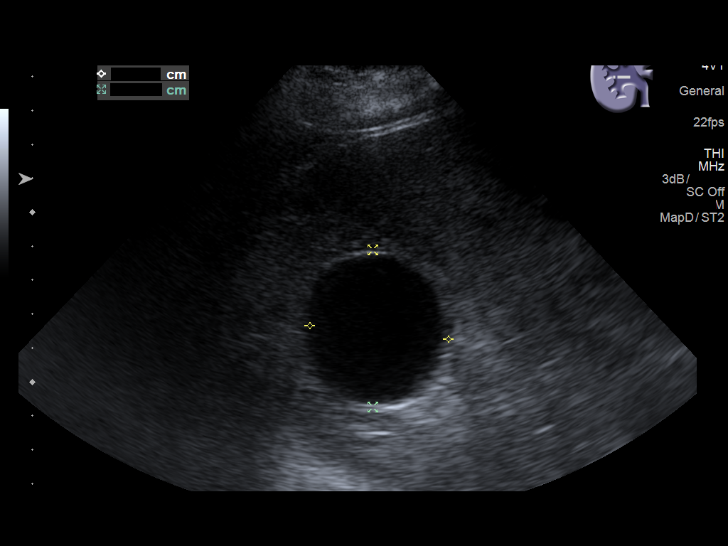
[im 31/41]
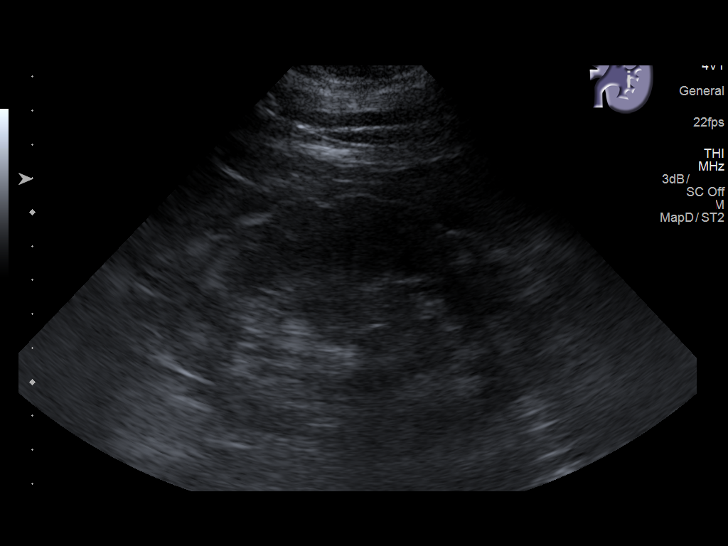
[im 34/41]
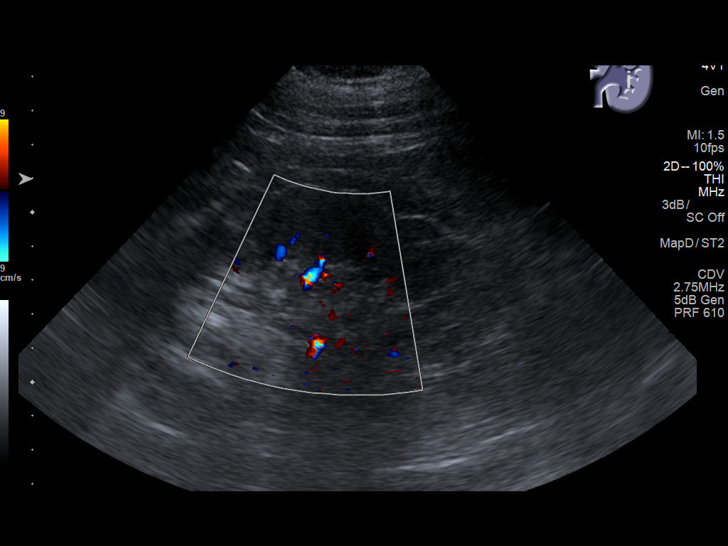
[im 37/41]
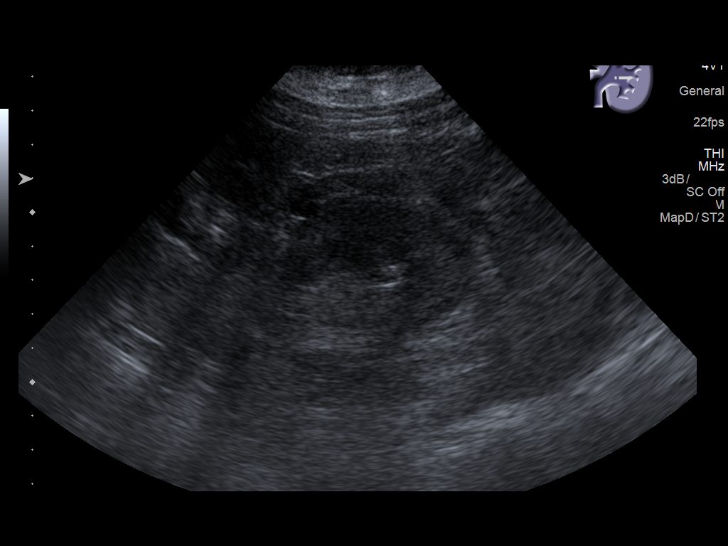
[im 41/41]
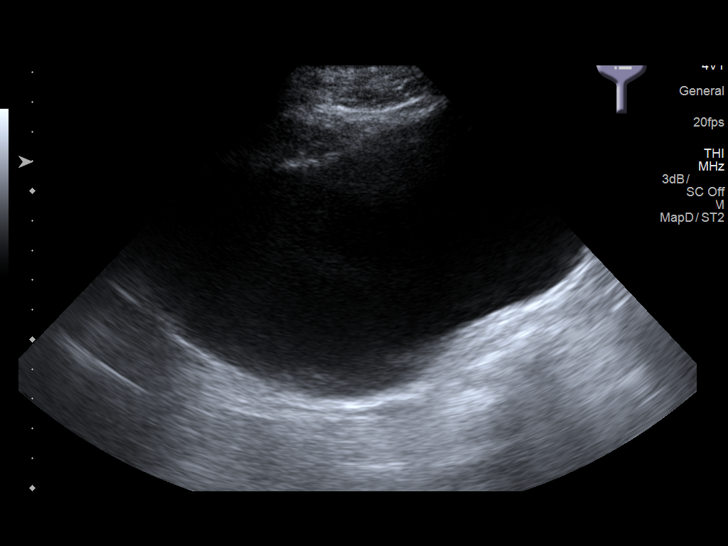

[14 of 25 positions shown; findings below may reference images not displayed]

FINDINGS: Right Kidney:

Renal measurements: 11.2 x 5.5 x 5.5 cm = volume: 182 mL mL.
Diffusely increased parenchymal echotexture consistent with chronic
medical renal disease. Simple appearing cyst in the upper pole
measuring 4.2 cm diameter. Cyst in the midpole measuring 3.1 cm
diameter. Tiny cyst in the lower pole measuring 1.5 cm diameter.
Cysts were present on previous CT. No hydronephrosis.

Left Kidney:

Renal measurements: 11.4 x 6.8 x 4.4 cm = volume: 182 mL mL.
Diffusely increased parenchymal echotexture consistent with chronic
medical renal disease. Small cyst in the midpole measuring 1.5 cm
diameter. No hydronephrosis.

Bladder:

No wall thickening or filling defects identified.
IMPRESSION: Bilateral increased renal parenchymal echotexture suggesting chronic
medical renal disease. Small bilateral renal cysts, unchanged since
previous CT. No hydronephrosis.

## 2020-05-25 IMAGING — US IR US GUIDE VASC ACCESS RIGHT
1 series · 1 of 1 positions shown · non-contrast
Comparison: CT angiogram of the head and neck of 02/28/2018.

CLINICAL DATA: Progressively worsening weakness in the left lower
extremity. Abnormal CT angiogram of the head and neck, and
progressive ischemic stroke involving the left anterior cerebral
artery distribution.
INDICATION: Progressive new left leg weakness. Abnormal CT angiogram of the head
and neck, and MRI of the brain.

EXAM:
IR ANGIO VERTEBRAL SEL SUBCLAVIAN INNOMINATE UNI RIGHT MOD SED; IR
ANGIO VERTEBRAL SEL VERTEBRAL UNI LEFT MOD SED; BILATERAL COMMON
CAROTID AND INNOMINATE ANGIOGRAPHY
TECHNIQUE: Informed written consent was obtained from the patient after a
thorough discussion of the procedural risks, benefits and
alternatives. All questions were addressed. Maximal Sterile Barrier
Technique was utilized including caps, mask, sterile gowns, sterile
gloves, sterile drape, hand hygiene and skin antiseptic. A timeout
was performed prior to the initiation of the procedure.

[Series 1: ir us guide vasc access right · 1 of 1 slices shown]
[im 1/1]
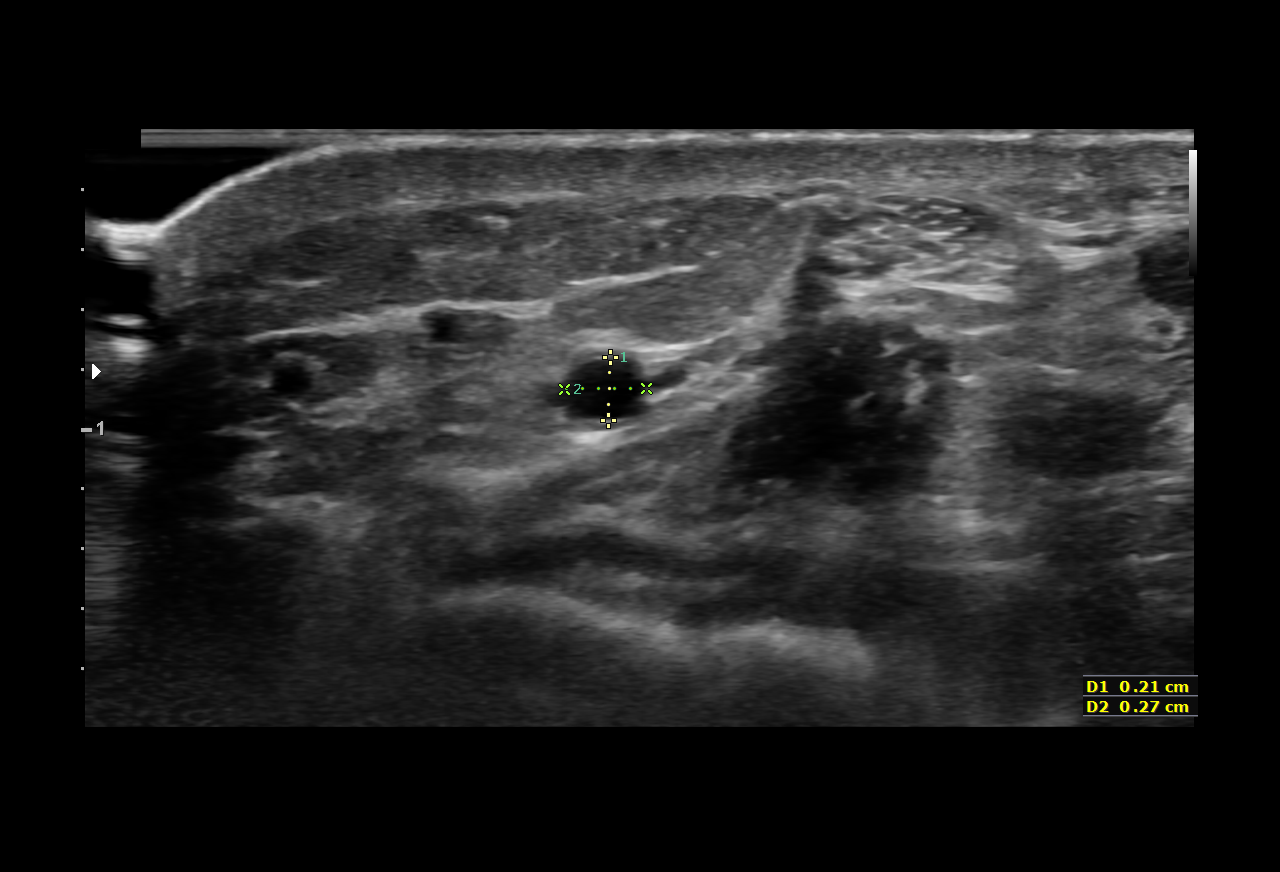

[1 of 1 positions shown; findings below may reference images not displayed]

MEDICATIONS:
Heparin 2888 units IV; no antibiotic was administered within 1 hour
of the procedure.

ANESTHESIA/SEDATION:
Versed 1 mg IV; Fentanyl 20 mcg IV

Moderate Sedation Time:  42 minutes

The patient was continuously monitored during the procedure by the
interventional radiology nurse under my direct supervision.

CONTRAST:  Isovue 300 approximately 60 cc

FLUOROSCOPY TIME:  Fluoroscopy Time: 14 minutes 30 seconds (1015
mGy).

COMPLICATIONS:
None immediate.
The right forearm to the wrist was prepped and draped in the usual
sterile fashion. [REDACTED] test revealed good dorsal Selin
anastamosis. Transradial access into the right radial artery was
obtained was obtained using ultrasound guidance with an 18 gauge
needle.

Thereafter over an 0.018 inch guidewire, a 5 French radial sheath
was then advanced into the radial artery without difficulty. Good
aspiration of blood was obtained from the side port of the 5 French
sheath.

At this time, a diluted mixture of 2.5 mg of verapamil, 200 mcg of
nitro, and 2888 units of heparin was injected through the sheath
into the radial artery without difficulty. Over a 0.035 inch
guidewire, a 5 [employer] Fernand Lherisson 3 diagnostic catheter was advanced to
the aortic arch region. Quintero catheter was then formed. Selective
cannulation and arteriograms were then performed of the left common
carotid artery, the left vertebral artery, the right common carotid
artery and the right subclavian artery.

The right radial 5 French sheath was then removed with the
successful application of a radial wrist band with hemostasis
achieved. Distal right radial pulse was palpable.
FINDINGS: The right subclavian arteriogram demonstrates the origin of the
right vertebral artery to be widely patent.

The vessel is seen to opacify to the cranial skull base.

Opacification to the right vertebrobasilar junction is noted with
flow noted in the right posterior-inferior cerebellar artery.

The left common carotid arteriogram demonstrates the left external
carotid artery and its major branches to be widely patent.

The left internal carotid artery at the bulb to the cranial skull
base demonstrates wide patency.

The petrous, the cavernous and the supraclinoid segments are widely
patent.

The left middle cerebral artery M1 segment demonstrates severe focal
stenosis in the mid M1 segment.

There is approximately 50% stenosis of the left middle cerebral
artery at its origin.

More distally, the perisylvian branches demonstrate focal area areas
of mild-to-moderate narrowing. The left anterior cerebral artery has
mild stenosis proximally.

More distally, the vessel is seen to opacify into the capillary and
venous phases.

Again noted are focal areas of caliber narrowing involving the A2
and A3 segments of the pericallosal and the callosal marginal
branches. A left posterior communicating artery is seen opacifying
the left posterior cerebral artery distribution with focal areas of
caliber narrowing in the P2 P3 segment.

The left vertebral artery origin is widely patent.

The vessel is seen to opacify to the cranial skull base. No discrete
evidence of left posterior-inferior cerebellar artery opacification
is noted.

However, there appears to be bilateral anterior-inferior cerebellar
artery/posterior-inferior cerebellar artery complexes. There is mild
narrowing of the left vertebrobasilar junction. The basilar artery
is seen to opacify with approximately 65-75% stenosis of the
junction of the distal and the middle [DATE] of the basilar artery,
distal to the origins of the anterior-inferior cerebellar arteries.
Tight narrowing of the origin of the left anterior-inferior
cerebellar artery at its origin is noted.

The superior cerebellar arteries demonstrate a tight focal stenosis
of the left posterior cerebral artery P1 P2 junction.

No left superior cerebellar artery is identified.

The right common carotid arteriogram demonstrates the right external
carotid artery origin and its branches to be widely patent.

The right internal carotid artery at the bulb to the cranial skull
base opacifies normally.

The petrous, cavernous and supraclinoid segments are widely patent
with mild caliber irregularity secondary to arteriosclerosis. The
right middle cerebral M1 segment is mildly irregular. The superior
division opacifies into the capillary and venous phases.

The dominant inferior division of the right middle cerebral artery
demonstrates a severe focal stenosis.

More distally, focal areas of mild caliber irregularity suggests
intracranial arteriosclerosis.

The right anterior cerebral artery A1 segment is opacified. However,
there is complete occlusion of the right anterior cerebral artery
proximal A2 segment.

Partial reconstitution of the right anterior cerebral artery
pericallosal and callosal marginal branches are noted from
collaterals arising from the perisylvian branches.
IMPRESSION: Angiographically occluded right anterior cerebral artery
approximately A2 level. Partial reconstitution of the territory
distally from collaterals arising from the perisylvian branches.

High-grade stenosis of the left middle cerebral artery mid M1
segment.

Approximately 50% stenosis of the left middle cerebral artery at its
origin.

High-grade severe stenosis of the dominant inferior division of the
right middle cerebral artery proximally.

Approximately 65-70% stenosis of the distal basilar artery.

Diffuse mild to moderate intracranial arteriosclerosis involving the
anterior circulations bilaterally as described above.

PLAN:
Angiographic findings were discussed with the patient. Given the
partial reconstitution of the right anterior cerebral artery
distribution distal to the A2 occlusion, it was decided to continue
with dual anti-platelet therapy.

Patient to follow-up in the clinic in 3 months time. Patient
expressed understanding and agreement with the above management
plan.
